# Patient Record
Sex: Female | Born: 1965 | Race: Black or African American | Hispanic: No | Marital: Married | Smoking: Current some day smoker
Health system: Southern US, Community
[De-identification: ages and names within clinical notes are randomized; demographics above are authoritative.]

## PROBLEM LIST (undated history)

## (undated) DIAGNOSIS — F4323 Adjustment disorder with mixed anxiety and depressed mood: Secondary | ICD-10-CM

## (undated) DIAGNOSIS — R002 Palpitations: Secondary | ICD-10-CM

## (undated) DIAGNOSIS — T7840XA Allergy, unspecified, initial encounter: Secondary | ICD-10-CM

## (undated) DIAGNOSIS — E119 Type 2 diabetes mellitus without complications: Secondary | ICD-10-CM

## (undated) DIAGNOSIS — R011 Cardiac murmur, unspecified: Secondary | ICD-10-CM

## (undated) DIAGNOSIS — Z8719 Personal history of other diseases of the digestive system: Secondary | ICD-10-CM

## (undated) DIAGNOSIS — M26609 Unspecified temporomandibular joint disorder, unspecified side: Secondary | ICD-10-CM

## (undated) DIAGNOSIS — N921 Excessive and frequent menstruation with irregular cycle: Secondary | ICD-10-CM

## (undated) DIAGNOSIS — K219 Gastro-esophageal reflux disease without esophagitis: Secondary | ICD-10-CM

## (undated) DIAGNOSIS — G709 Myoneural disorder, unspecified: Secondary | ICD-10-CM

## (undated) DIAGNOSIS — E669 Obesity, unspecified: Secondary | ICD-10-CM

## (undated) DIAGNOSIS — I10 Essential (primary) hypertension: Secondary | ICD-10-CM

## (undated) DIAGNOSIS — F419 Anxiety disorder, unspecified: Secondary | ICD-10-CM

## (undated) HISTORY — DX: Allergy, unspecified, initial encounter: T78.40XA

## (undated) HISTORY — DX: Gastro-esophageal reflux disease without esophagitis: K21.9

## (undated) HISTORY — DX: Obesity, unspecified: E66.9

## (undated) HISTORY — DX: Excessive and frequent menstruation with irregular cycle: N92.1

## (undated) HISTORY — DX: Adjustment disorder with mixed anxiety and depressed mood: F43.23

## (undated) HISTORY — PX: UPPER GASTROINTESTINAL ENDOSCOPY: SHX188

## (undated) HISTORY — DX: Personal history of other diseases of the digestive system: Z87.19

## (undated) HISTORY — DX: Type 2 diabetes mellitus without complications: E11.9

## (undated) HISTORY — DX: Essential (primary) hypertension: I10

## (undated) HISTORY — DX: Myoneural disorder, unspecified: G70.9

## (undated) HISTORY — DX: Unspecified temporomandibular joint disorder, unspecified side: M26.609

## (undated) HISTORY — DX: Palpitations: R00.2

## (undated) HISTORY — DX: Cardiac murmur, unspecified: R01.1

---

## 1987-01-16 HISTORY — PX: CHOLECYSTECTOMY: SHX55

## 2008-01-16 HISTORY — PX: WISDOM TOOTH EXTRACTION: SHX21

## 2009-05-15 LAB — CONVERTED CEMR LAB: Pap Smear: NORMAL

## 2009-12-14 ENCOUNTER — Ambulatory Visit: Payer: Self-pay | Admitting: Internal Medicine

## 2009-12-14 DIAGNOSIS — F17200 Nicotine dependence, unspecified, uncomplicated: Secondary | ICD-10-CM

## 2009-12-14 DIAGNOSIS — F4323 Adjustment disorder with mixed anxiety and depressed mood: Secondary | ICD-10-CM

## 2009-12-14 DIAGNOSIS — N921 Excessive and frequent menstruation with irregular cycle: Secondary | ICD-10-CM

## 2009-12-14 DIAGNOSIS — IMO0002 Reserved for concepts with insufficient information to code with codable children: Secondary | ICD-10-CM | POA: Insufficient documentation

## 2009-12-14 DIAGNOSIS — R12 Heartburn: Secondary | ICD-10-CM

## 2009-12-14 DIAGNOSIS — I10 Essential (primary) hypertension: Secondary | ICD-10-CM | POA: Insufficient documentation

## 2009-12-14 DIAGNOSIS — M542 Cervicalgia: Secondary | ICD-10-CM | POA: Insufficient documentation

## 2009-12-14 HISTORY — DX: Excessive and frequent menstruation with irregular cycle: N92.1

## 2009-12-14 HISTORY — DX: Adjustment disorder with mixed anxiety and depressed mood: F43.23

## 2009-12-23 ENCOUNTER — Encounter: Payer: Self-pay | Admitting: Internal Medicine

## 2009-12-23 ENCOUNTER — Telehealth: Payer: Self-pay | Admitting: Internal Medicine

## 2010-01-25 ENCOUNTER — Other Ambulatory Visit: Payer: Self-pay | Admitting: Internal Medicine

## 2010-01-25 ENCOUNTER — Ambulatory Visit
Admission: RE | Admit: 2010-01-25 | Discharge: 2010-01-25 | Payer: Self-pay | Source: Home / Self Care | Attending: Internal Medicine | Admitting: Internal Medicine

## 2010-01-25 LAB — LIPID PANEL
Cholesterol: 180 mg/dL (ref 0–200)
HDL: 35.1 mg/dL — ABNORMAL LOW (ref >39.00)
LDL Cholesterol: 123 mg/dL — ABNORMAL HIGH (ref 0–99)
Total CHOL/HDL Ratio: 5
Triglycerides: 111 mg/dL (ref 0.0–149.0)
VLDL: 22.2 mg/dL (ref 0.0–40.0)

## 2010-01-25 LAB — CBC WITH DIFFERENTIAL/PLATELET
Basophils Absolute: 0 10*3/uL (ref 0.0–0.1)
Basophils Relative: 0.5 % (ref 0.0–3.0)
Eosinophils Absolute: 0.2 10*3/uL (ref 0.0–0.7)
Eosinophils Relative: 2.4 % (ref 0.0–5.0)
HCT: 42.3 % (ref 36.0–46.0)
Hemoglobin: 14.7 g/dL (ref 12.0–15.0)
Lymphocytes Relative: 34.4 % (ref 12.0–46.0)
Lymphs Abs: 2.2 10*3/uL (ref 0.7–4.0)
MCHC: 34.8 g/dL (ref 30.0–36.0)
MCV: 90.9 fl (ref 78.0–100.0)
Monocytes Absolute: 0.3 10*3/uL (ref 0.1–1.0)
Monocytes Relative: 5.5 % (ref 3.0–12.0)
Neutro Abs: 3.7 10*3/uL (ref 1.4–7.7)
Neutrophils Relative %: 57.2 % (ref 43.0–77.0)
Platelets: 301 10*3/uL (ref 150.0–400.0)
RBC: 4.65 Mil/uL (ref 3.87–5.11)
RDW: 14.4 % (ref 11.5–14.6)
WBC: 6.4 10*3/uL (ref 4.5–10.5)

## 2010-01-25 LAB — BASIC METABOLIC PANEL
BUN: 18 mg/dL (ref 6–23)
CO2: 28 mEq/L (ref 19–32)
Calcium: 9.4 mg/dL (ref 8.4–10.5)
Chloride: 103 mEq/L (ref 96–112)
Creatinine, Ser: 0.7 mg/dL (ref 0.4–1.2)
GFR: 95.71 mL/min (ref >60.00)
Glucose, Bld: 91 mg/dL (ref 70–99)
Potassium: 4.3 mEq/L (ref 3.5–5.1)
Sodium: 139 mEq/L (ref 135–145)

## 2010-01-25 LAB — HEPATIC FUNCTION PANEL
ALT: 25 U/L (ref 0–35)
AST: 23 U/L (ref 0–37)
Albumin: 4.2 g/dL (ref 3.5–5.2)
Alkaline Phosphatase: 55 U/L (ref 39–117)
Bilirubin, Direct: 0.1 mg/dL (ref 0.0–0.3)
Total Bilirubin: 1 mg/dL (ref 0.3–1.2)
Total Protein: 7.5 g/dL (ref 6.0–8.3)

## 2010-01-25 LAB — TSH: TSH: 1.56 u[IU]/mL (ref 0.35–5.50)

## 2010-01-31 ENCOUNTER — Ambulatory Visit
Admission: RE | Admit: 2010-01-31 | Discharge: 2010-01-31 | Payer: Self-pay | Source: Home / Self Care | Attending: Internal Medicine | Admitting: Internal Medicine

## 2010-01-31 ENCOUNTER — Encounter: Payer: Self-pay | Admitting: Internal Medicine

## 2010-01-31 DIAGNOSIS — E669 Obesity, unspecified: Secondary | ICD-10-CM | POA: Insufficient documentation

## 2010-01-31 DIAGNOSIS — E786 Lipoprotein deficiency: Secondary | ICD-10-CM | POA: Insufficient documentation

## 2010-02-14 NOTE — Letter (Signed)
Summary: PATIENT HX FORMS  PATIENT HX FORMS   Imported By: Georgian Co 12/23/2009 11:30:01  _____________________________________________________________________  External Attachment:    Type:   Image     Comment:   External Document

## 2010-02-14 NOTE — Progress Notes (Signed)
Summary: wants diflucan  Phone Note Call from Patient Call back at [phone]   Caller: Patient----triage vm Summary of Call: has a yeast infection. she has these once a year.(listed in her chart). She is requesting diflucan.     [facility]   please advise. Initial call taken by: Warnell Forester,  December 23, 2009 8:38 AM  Follow-up for Phone Call        Pt is having vaginal itching and redness but no discharge.  Has not developed the discharge and has not been on an antibiotic recently.  Has used a different soap recently and this always causes.  [facility]  Follow-up by: Lynann Beaver CMA AAMA,  December 23, 2009 10:29 AM  Additional Follow-up for Phone Call Additional follow up Details #1::        Per Dr. Fabian Sharp- OTC work better as the pill like monistat and is perfer. If pt wants we can call in diflucan 150 #1 as a single dose no refills. Additional Follow-up by: Romualdo Bolk, CMA (AAMA),  December 23, 2009 12:50 PM    New/Updated Medications: DIFLUCAN 150 MG TABS (FLUCONAZOLE) one by mouth daily Prescriptions: DIFLUCAN 150 MG TABS (FLUCONAZOLE) one by mouth daily  #1 x 0   Entered by:   Lynann Beaver CMA AAMA   Authorized by:   Madelin Headings MD   Signed by:   Lynann Beaver CMA AAMA on 12/23/2009   Method used:   Electronically [facility]        [facility]  [phone]* (retail)       [address] [place], [place]  [address]       Ph: [phone] or [phone]       Fax: [phone]   RxID:   [id number]  Notified pt.

## 2010-02-14 NOTE — Assessment & Plan Note (Signed)
Summary: BRAND NEW PT/TO EST/CJR   Vital Signs:  Patient profile:   45 year old female Menstrual status:  irregular   mirena usage LMP:     11/16/2009 Height:      67 inches Weight:      258 pounds BMI:     40.55 O2 Sat:      96 % Temp:     98.7 degrees F oral Pulse rate:   85 / minute Pulse rhythm:   regular Resp:     12 per minute BP sitting:   124 / 82  Vitals Entered By: Lynann Beaver CMA AAMA (December 14, 2009 11:56 AM)  Nutrition Counseling: Patient's BMI is greater than 25 and therefore counseled on weight management options. CC: To establish and refill meds Is Patient Diabetic? No Pain Assessment Patient in pain? no      LMP (date): 11/16/2009     Menstrual Status irregular   mirena usage Enter LMP: 11/16/2009   History of Present Illness: Beth Wallace comes in today  for first time visit to establish . her previous primary care physician list [facility]  in [place] from where she moved. She is originally from [place] and is here closer to relatives. She has a diagnosis of hypertension reactive mood disorder occasional GRD sciatic nerve problem. Her last check was in May  where she reports that she had nl labs .  Has had Ocass  low  Vit d  levels and  taking  as needed.    GERD: only as needed  prilosec.  Ibuprofen    as needed  for   Menstrual cramps.    Considered    hysterectomy  > cause.  gyne records.  Anxiety and celexa :  as needed.  HT : controlled without se of meds   needs refill  she has had some problems with lower back and sciatic for which she was seen at an urgent care. She also has had some neck aches on the right but no radiating weakness or numbness in her hands or feet. She's working at home in front of the screen most hours of the day and hasn't time for for regular exercise. Irreg  bleeding   rx with IUD    May need a gyne consult   when established  Preventive Screening-Counseling & Management  Alcohol-Tobacco  Alcohol drinks/day: <1     Smoking Status: current     Smoke Cessation Stage: contemplative     Packs/Day: <0.25  Comments: off  4 year until daughter died.   Hep-HIV-STD-Contraception     Dental Visit-last 6 months yes  Safety-Violence-Falls     Seat Belt Use: yes     Firearms in the Home: firearms in the home     Firearm Counseling: not indicated; uses recommended firearm safety measures     Smoke Detectors: yes     Fall Risk: no  Past History:  Past Medical History: Hypertension on med from 1990s   during pregnancy  G3P1 Sciatica IUD GERD  PAP 4 /2011 Mammo 8 /2011 Tdap 5 /2011  Past Surgical History: Cholecystectomy 1989  Family History: duaghter murdered   in [place] 59   age 43 .   Father: HT  diabtes Mother:  Mom died 02-28-2009 heart  disease  HT  Siblings:   sister  murdered.    Social History: orig from [place] and changes   for job.  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         sciatic nerve back problem  seen in urgent care  and rec MRI.    but not done.     cariposa   and pain pill.   hydrocodone.  September.  hx of pinched nerve.   Physical Exam  General:  Well-developed,well-nourished,in no acute distress; alert,appropriate and cooperative throughout examination Head:  normocephalic and atraumatic.   Eyes:  PERRL, EOMs full, conjunctiva clear  Ears:  R ear normal and L ear normal.   Nose:  no  external deformity and no nasal discharge.   Mouth:  pharynx pink and moist.   Neck:  No deformities, masses, or tenderness noted. Lungs:  Normal respiratory effort, chest expands symmetrically. Lungs are clear to auscultation, no crackles or wheezes.no dullness.   Heart:  Normal rate and regular rhythm. S1 and S2 normal without gallop, murmur, click, rub or other extra sounds.no lifts.   Abdomen:  Bowel sounds positive,abdomen soft and non-tender without masses, organomegaly or   noted. Msk:  no joint swelling and no joint warmth.  some stiffness of neck muscles no midline tenderness Pulses:  pulses intact without delay   Extremities:  no clubbing cyanosis or edema  Neurologic:  alert & oriented X3, strength normal in all extremities, and gait normal.  neg slr  Pt is A&Ox3,affect,speech,memory,attention,&motor skills appear intact.  Skin:  turgor normal, color normal, no ecchymoses, and no petechiae.   Cervical Nodes:  No lymphadenopathy noted Psych:  Oriented X3, memory intact for recent and remote, normally interactive, good eye contact, not anxious appearing, and not depressed appearing.     Impression & Recommendations:  Problem # 1:  HYPERTENSION (ICD-401.9)  Her updated medication list for this problem includes:    Lisinopril-hydrochlorothiazide 20-12.5 Mg Tabs (Lisinopril-hydrochlorothiazide) ..... One by mouth daily  BP today: 124/82  Problem # 2:  BACK PAIN WITH RADICULOPATHY (ICD-729.2) improved over time according to patient at this point cannot think MRIs indicated.Marland Kitchen expectant management  Problem # 3:  ADJ DISORDER WITH MIXED ANXIETY & DEPRESSED MOOD (ICD-309.28) actually doing well and improve from initial medication use she's using Ativan and selects out as needed. This is mostly related to legal proceedings regarding her daughter's murder.  Problem # 4:  NECK AND BACK PAIN (ICD-723.1) seems to be muscular and possibly aggravated by lack of activity and  workstation Her updated medication list for this problem includes:    Flexeril 10 Mg Tabs (Cyclobenzaprine hcl) .Marland Kitchen... Prn    Ibuprofen 800 Mg Tabs (Ibuprofen) .Marland Kitchen... Prn  Problem # 5:  HEARTBURN (ICD-787.1) possible GERD. Using Prilosec is needed  Problem # 6:  TOBACCO USE (ICD-305.1) had stopped and restarted when her daughter was murdered is smoking minimally. Encouraged to stop and she is motivated.  Problem # 7:  METRORRHAGIA (ICD-626.6) IUD for therapy  may need gyne follow up .   Complete Medication List: 1)  Lisinopril-hydrochlorothiazide 20-12.5 Mg Tabs (Lisinopril-hydrochlorothiazide) .... One by mouth daily 2)  Ativan 0.5 Mg Tabs (  Lorazepam) .... ? 3)  Flexeril 10 Mg Tabs (Cyclobenzaprine hcl) .... Prn 4)  Celexa 20 Mg Tabs (Citalopram hydrobromide) .... ? 5)  Ibuprofen 800 Mg Tabs (Ibuprofen) .... Prn 6)  Prilosec 20 Mg Cpdr (Omeprazole) .... Prn 7)  Mirena 20 Mcg/24hr Iud (Levonorgestrel) .... ? type  Patient Instructions: 1)  check you work station and increase exercise for now as this will help your neck and back problem. 2)  Sign release for last year of lab and medical care and your GYNE records. 3)  schedule  cpx and labs   and check up  in January  2012 . ( can make a slot)  Prescriptions: LISINOPRIL-HYDROCHLOROTHIAZIDE 20-12.5 MG TABS (LISINOPRIL-HYDROCHLOROTHIAZIDE) one by mouth daily  #90 x 3   Entered and Authorized by:   Madelin Headings MD   Signed by:   Madelin Headings MD on 12/14/2009   Method used:   [facility]        [facility]  [phone]* (retail)       [address] [place], [place]  [address]       Ph: [phone] or [phone]       Fax: [phone]   RxID:   [id number]    Orders Added: 1)  New Patient Level IV [99204]      Prevention & Chronic Care Immunizations   Influenza vaccine: Not documented    Tetanus booster: Not documented    Pneumococcal vaccine: Not documented  Other Screening   Pap smear: Not  documented    Mammogram: Not documented   Smoking status: current  (12/14/2009)  Lipids   Total Cholesterol: Not documented   LDL: Not documented   LDL Direct: Not documented   HDL: Not documented   Triglycerides: Not documented  Hypertension   Last Blood Pressure: 124 / 82  (12/14/2009)   Serum creatinine: Not documented   Serum potassium Not documented  Self-Management Support :    Hypertension self-management support: Not documented

## 2010-02-16 NOTE — Assessment & Plan Note (Signed)
Summary: cpx//acm rsc per pt ok per doc/njr   Vital Signs:  Patient profile:   45 year old female Menstrual status:  irregular   mirena usage Height:      67 inches Weight:      256 pounds Pulse rate:   72 / minute BP sitting:   120 / 80  (left arm) Cuff size:   large  Vitals Entered By: Romualdo Bolk, CMA (AAMA) (January 31, 2010 1:09 PM) CC: CPX no pap- Pt has a gyn who does paps   History of Present Illness: Beth Wallace comes in today  for preventive visit . Since last visit has done well. HT: no change meds doing well. Mood : only taking  ativan  as needed  as  needed for anxiety attacks since death of daughter coping well. Tobacco  only 2-3 per day Neck pain an right middle finger nubmness seem related to laying on elevated pillows and work station .still on computer 10 hours per day and work station not yet optimal no weakness or other neuro signs .   Preventive Care Screening  Last Tetanus Booster:    Date:  05/15/2009    Results:  Historical   Pap Smear:    Date:  05/15/2009    Results:  normal    Preventive Screening-Counseling & Management  Alcohol-Tobacco     Alcohol drinks/day: <1     Smoking Status: current     Smoke Cessation Stage: contemplative     Packs/Day: <0.25     Tobacco Counseling: to quit use of tobacco products  Caffeine-Diet-Exercise     Caffeine use/day: no     Does Patient Exercise: yes     Type of exercise: walk     Exercise (avg: min/session): 30-60     Times/week: 3  Hep-HIV-STD-Contraception     Dental Visit-last 6 months yes  Safety-Violence-Falls     Seat Belt Use: yes     Firearms in the Home: firearms in the home     Firearm Counseling: not indicated; uses recommended firearm safety measures     Smoke Detectors: yes     Fall Risk: no  Current Medications (verified): 1)  Lisinopril-Hydrochlorothiazide 20-12.5 Mg Tabs (Lisinopril-Hydrochlorothiazide) .... One By Mouth Daily 2)  Ativan 0.5 Mg Tabs (Lorazepam)  .... ? 3)  Flexeril 10 Mg Tabs (Cyclobenzaprine Hcl) .... Prn 4)  Celexa 20 Mg Tabs (Citalopram Hydrobromide) .... ? 5)  Ibuprofen 800 Mg Tabs (Ibuprofen) .Marland Kitchen.. 1 Once Daily or As Directed 6)  Prilosec 20 Mg Cpdr (Omeprazole) .Marland Kitchen.. 1 By Mouth Once Daily 7)  Mirena 20 Mcg/24hr Iud (Levonorgestrel) .... ? Type  Allergies (verified): No Known Drug Allergies  Past History:  Past medical, surgical, family and social histories (including risk factors) reviewed, and no changes noted (except as noted below).  Past Medical History: Reviewed history from 12/14/2009 and no changes required. Hypertension on med from 1990s   during pregnancy  G3P1 Sciatica IUD GERD  PAP 4 /2011 Mammo 8 /2011 Tdap 5 /2011  Past Surgical History: Reviewed history from 12/14/2009 and no changes required. Cholecystectomy 1989  Past History:  Care Management: Gynecology: [place]  Family History: Reviewed history from 12/14/2009 and no changes required. duaghter murdered   in [place] 11   age 43 .   Father: HT  diabtes Mother:  Mom died 03-04-09 heart  disease  HT  Siblings:   sister  murdered.    Social History: Reviewed history from 12/14/2009 and no  changes required. orig from [place] and changes   for job.  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   Eyes:  PERRL, EOMs full, conjunctiva clear  Ears:  R ear normal and L ear normal.  no external deformities.   Nose:  no external deformity and no nasal discharge.   Mouth:  pharynx pink and moist.   good dentition.   Neck:  No deformities, masses, or tenderness noted. Chest Wall:  No deformities, masses, or tenderness noted. Breasts:  No mass, nodules, thickening, tenderness, bulging, retraction, inflamation, nipple discharge or skin changes noted.   Lungs:  Normal respiratory effort, chest expands symmetrically. Lungs are clear to auscultation, no crackles or wheezes. Heart:  Normal rate and regular rhythm. S1 and S2 normal without gallop, murmur, click, rub or other extra sounds. Abdomen:  Bowel sounds positive,abdomen soft and non-tender without masses, organomegaly or   noted. ruq scar fom GB surgery Genitalia:  per gyne Msk:  normal ROM, no joint swelling, no joint warmth, and no redness over joints.   no hand atrophy  grip nl   Pulses:  pulses intact without delay   Extremities:  no clubbing cyanosis or edema  Neurologic:  alert & oriented X3, strength normal in all extremities, gait normal, and DTRs symmetrical and normal.   Skin:  turgor normal, color normal, no ecchymoses, no petechiae, and no purpura.   Cervical Nodes:  No lymphadenopathy noted Axillary Nodes:  No palpable lymphadenopathy Inguinal Nodes:  No significant adenopathy Psych:  Oriented X3, memory intact for recent and remote, normally interactive, good eye contact, not anxious appearing, and not depressed appearing.   ekg nsr   Impression & Recommendations:  Problem # 1:  PREVENTIVE HEALTH CARE (ICD-V70.0)  Discussed nutrition,exercise,diet,healthy weight, vitamin D and calcium.   Orders: EKG w/ Interpretation (93000)  Problem # 2:  HYPERTENSION (ICD-401.9)  Her updated medication list for this problem includes:    Lisinopril-hydrochlorothiazide 20-12.5 Mg Tabs (Lisinopril-hydrochlorothiazide) ..... One by mouth daily  BP today: 120/80 Prior BP: 124/82 (12/14/2009)  Labs Reviewed: K+: 4.3 (01/25/2010) Creat: : 0.7 (01/25/2010)   Chol: 180 (01/25/2010)   HDL: 35.10 (01/25/2010)   LDL: 123 (01/25/2010)    TG: 111.0 (01/25/2010)  Orders: EKG w/ Interpretation (93000)  Problem # 3:  TOBACCO USE (ICD-305.1) disc dc counsel  Problem # 4:  HEARTBURN (ICD-787.1) Assessment: Improved using meds rn   Problem # 5:  NECK AND BACK PAIN (ICD-723.1) back better  had neck postural when lays certain way with right middle finger numbness  currently not active ... seems  radiular no alarm reatures Her updated medication list for this problem includes:    Flexeril 10 Mg Tabs (Cyclobenzaprine hcl) .Marland Kitchen... Prn    Ibuprofen 800 Mg Tabs (Ibuprofen) .Marland Kitchen... 1 once daily or as directed  Problem # 6:  ADJ DISORDER WITH MIXED ANXIETY & DEPRESSED MOOD (ICD-309.28) only usin ativan as needed   Problem # 7:  OBESITY (ICD-278.00) counseled  Ht: 67 (01/31/2010)   Wt: 256 (01/31/2010)   BMI: 40.55 (12/14/2009)  Problem # 8:  dysmenorrhea to see dr Henderson Cloud   iud for 3 years   Problem # 9:  LOW HDL (ICD-272.5)  lifestyle intervention   Labs Reviewed: SGOT: 23 (01/25/2010)   SGPT: 25 (01/25/2010)   HDL:35.10 (01/25/2010)  LDL:123 (01/25/2010)  Chol:180 (01/25/2010)  Trig:111.0 (01/25/2010)  Complete Medication List: 1)  Lisinopril-hydrochlorothiazide 20-12.5 Mg Tabs (Lisinopril-hydrochlorothiazide) .... One by mouth daily 2)  Ativan 0.5 Mg Tabs (Lorazepam) .... ? 3)  Flexeril 10 Mg Tabs (Cyclobenzaprine hcl) .... Prn 4)  Celexa 20 Mg Tabs (Citalopram hydrobromide) .... ? 5)  Ibuprofen 800 Mg Tabs (Ibuprofen) .Marland Kitchen.. 1 once daily or as directed 6)  Prilosec 20 Mg Cpdr (Omeprazole) .Marland Kitchen.. 1 by mouth once daily 7)  Mirena 20 Mcg/24hr Iud (Levonorgestrel) .... ? type  Patient Instructions: 1)  see Dr Henderson Cloud for your cramps with the IUD. 2)  Call if  neck and finger numbness  is persistent or  progressive   and can do a referral.  3)  Continue Bp meds. 4)  D/c tobacco as you are doing  weight loss and exercise to help your  lipids and health.  5)  Check up in 12 months  6)  ROV  in 6 months if having any  problems Prescriptions: PRILOSEC 20 MG CPDR (OMEPRAZOLE) 1 by mouth once daily  #30 x 6   Entered and Authorized by:   Madelin Headings MD   Signed by:   Madelin Headings MD on 01/31/2010   Method used:   [facility]        [facility]  [phone][facility] (retail)       [address] [place], [place]  [address]       Ph: [phone] or [phone]       Fax: [phone]   RxID:   [id number]    Orders Added: 1)  Est. Patient 40-64 years [99396] 2)  EKG w/ Interpretation [93000] 3)  Est. Patient Level II [36644]

## 2010-05-01 ENCOUNTER — Telehealth: Payer: Self-pay | Admitting: *Deleted

## 2010-05-01 NOTE — Telephone Encounter (Signed)
OK to refill

## 2010-05-01 NOTE — Telephone Encounter (Signed)
Nasal congestion and pollen allergy and always uses Flonase when she was in [place].  Also, has laryngitis.

## 2010-05-02 ENCOUNTER — Ambulatory Visit (INDEPENDENT_AMBULATORY_CARE_PROVIDER_SITE_OTHER): Payer: 59 | Admitting: Internal Medicine

## 2010-05-02 ENCOUNTER — Encounter: Payer: Self-pay | Admitting: Internal Medicine

## 2010-05-02 VITALS — BP 120/80 | HR 78 | Temp 98.5°F | Wt 256.0 lb

## 2010-05-02 DIAGNOSIS — J019 Acute sinusitis, unspecified: Secondary | ICD-10-CM

## 2010-05-02 DIAGNOSIS — R1110 Vomiting, unspecified: Secondary | ICD-10-CM

## 2010-05-02 DIAGNOSIS — F17200 Nicotine dependence, unspecified, uncomplicated: Secondary | ICD-10-CM

## 2010-05-02 DIAGNOSIS — J040 Acute laryngitis: Secondary | ICD-10-CM

## 2010-05-02 MED ORDER — AZITHROMYCIN 250 MG PO TABS: 250.0000 mg | ORAL_TABLET | ORAL | Status: AC

## 2010-05-02 MED ORDER — FLUTICASONE PROPIONATE 50 MCG/ACT NA SUSP: 1.0000 | Freq: Every day | NASAL | Status: DC

## 2010-05-02 MED ORDER — HYDROCODONE-HOMATROPINE 5-1.5 MG/5ML PO SYRP: 5.0000 mL | ORAL_SOLUTION | ORAL | Status: AC | PRN

## 2010-05-02 NOTE — Patient Instructions (Signed)
Take antibiotic for presumed bacterial sinus infection. Continued tobacco free. Can use the cough medicine at night for comfort. Either way expect improvement within 3-5 days. Call if getting recurrent fever or shortness of breath.  Continue the Flonase because when necessary allergy season.

## 2010-05-02 NOTE — Progress Notes (Signed)
  Subjective:    Patient ID: Beth Wallace, female    DOB: 1965-01-18, 45 y.o.   MRN: [id number]  HPI Patient comes in with 5+ days of above problem. Onset with face pain congestion and pressure. She now has significant hoarseness sore throat and coughing. She has post cough emesis. She's tried many over-the-counter as including sinus irrigation and is getting a refill on her Flonase.    She has not had tobacco since this began as a trying to stop down to a third of a pack per day. Her throat in her chest feel tight at times but no hemoptysis or shortness of breath.   Review of Systems She's no longer on Celexa and just take lorazepam as needed. Hypertension is in control. No fever or chills rest as per history of present illness    Objective:   Physical Exam Well-developed well-nourished in no acute distress but is very hoarse and congested. HEENT: Normocephalic ;atraumatic , Eyes;  PERRL, EOMs  Full, lids and conjunctiva clear,,Ears: no deformities, canals nl, TM landmarks normal, Nose: no deformity or discharge very congested tenderness in the right maxillary area and left frontal.  Mouth : OP   mildly red copious PND no lesion or edema. Chest:  Clear to A&P without wheezes rales or rhonchi CV:  S1-S2 no gallops or murmurs peripheral perfusion is normal No clubbing cyanosis or edema  .        Assessment & Plan:  Acute respiratory inspection probable sinusitis although within the first 7 days the severity is significant and she also has post cough emesis. Possible allergic history agree with restarting the Flonase. We'll treat with antibiotics and cough medicine expectant management.  Tobacco   encouraged to stay tobacco free Hypertension controlled

## 2010-05-02 NOTE — Telephone Encounter (Signed)
Per Dr. Fabian Sharp- ok to refill. Rx sent to pharmacy.

## 2010-06-13 ENCOUNTER — Telehealth: Payer: Self-pay | Admitting: *Deleted

## 2010-06-13 MED ORDER — OMEPRAZOLE 20 MG PO CPDR: 20.0000 mg | DELAYED_RELEASE_CAPSULE | Freq: Every day | ORAL | Status: DC

## 2010-06-13 NOTE — Telephone Encounter (Signed)
rx sent to pharmacy

## 2010-06-20 ENCOUNTER — Telehealth: Payer: Self-pay | Admitting: *Deleted

## 2010-06-20 MED ORDER — OMEPRAZOLE 20 MG PO CPDR: 20.0000 mg | DELAYED_RELEASE_CAPSULE | Freq: Every day | ORAL | Status: DC

## 2010-06-20 NOTE — Telephone Encounter (Signed)
rx sent to pharmacy

## 2010-07-10 ENCOUNTER — Emergency Department (HOSPITAL_COMMUNITY)
Admission: EM | Admit: 2010-07-10 | Discharge: 2010-07-10 | Disposition: A | Discharge status: Home / Self Care | Payer: 59 | Attending: Emergency Medicine | Admitting: Emergency Medicine

## 2010-07-10 ENCOUNTER — Telehealth: Payer: Self-pay | Admitting: *Deleted

## 2010-07-10 ENCOUNTER — Emergency Department (HOSPITAL_COMMUNITY): Payer: 59

## 2010-07-10 DIAGNOSIS — N949 Unspecified condition associated with female genital organs and menstrual cycle: Secondary | ICD-10-CM | POA: Insufficient documentation

## 2010-07-10 DIAGNOSIS — D259 Leiomyoma of uterus, unspecified: Secondary | ICD-10-CM | POA: Insufficient documentation

## 2010-07-10 DIAGNOSIS — I10 Essential (primary) hypertension: Secondary | ICD-10-CM | POA: Insufficient documentation

## 2010-07-10 LAB — URINALYSIS, ROUTINE W REFLEX MICROSCOPIC
Bilirubin Urine: NEGATIVE
Glucose, UA: NEGATIVE mg/dL
Protein, ur: NEGATIVE mg/dL

## 2010-07-10 LAB — URINE MICROSCOPIC-ADD ON

## 2010-07-10 LAB — WET PREP, GENITAL
Trich, Wet Prep: NONE SEEN
Yeast Wet Prep HPF POC: NONE SEEN

## 2010-07-10 LAB — PREGNANCY, URINE: Preg Test, Ur: NEGATIVE

## 2010-07-10 NOTE — Telephone Encounter (Signed)
Spoke to [place] and pt advised to go to GYN, and if cannot get in, go to ER.

## 2010-07-10 NOTE — Telephone Encounter (Signed)
Mirena is cramping and cannot find string.

## 2010-07-12 LAB — RPR: RPR Ser Ql: NONREACTIVE

## 2010-08-16 ENCOUNTER — Telehealth: Payer: Self-pay | Admitting: *Deleted

## 2010-08-16 NOTE — Telephone Encounter (Signed)
Pt is going to have a hyst on 9/26 and was told that she needs a medical clearance. Pt had a cpx done on 01/31/10. Do you still need her to come in for or , or can you just do a clearance letter?

## 2010-08-16 NOTE — Telephone Encounter (Signed)
Per dr. Fabian Sharp- appt.

## 2010-08-17 NOTE — Telephone Encounter (Signed)
Pt aware and appt made.

## 2010-09-16 HISTORY — PX: ABDOMINAL HYSTERECTOMY: SHX81

## 2010-09-20 ENCOUNTER — Ambulatory Visit (INDEPENDENT_AMBULATORY_CARE_PROVIDER_SITE_OTHER): Payer: 59 | Admitting: Internal Medicine

## 2010-09-20 ENCOUNTER — Encounter: Payer: Self-pay | Admitting: Internal Medicine

## 2010-09-20 VITALS — BP 140/82 | HR 72 | Ht 67.5 in | Wt 266.0 lb

## 2010-09-20 DIAGNOSIS — N921 Excessive and frequent menstruation with irregular cycle: Secondary | ICD-10-CM

## 2010-09-20 DIAGNOSIS — I10 Essential (primary) hypertension: Secondary | ICD-10-CM

## 2010-09-20 DIAGNOSIS — H9209 Otalgia, unspecified ear: Secondary | ICD-10-CM | POA: Insufficient documentation

## 2010-09-20 DIAGNOSIS — Z01818 Encounter for other preprocedural examination: Secondary | ICD-10-CM

## 2010-09-20 MED ORDER — IBUPROFEN 800 MG PO TABS: 800.0000 mg | ORAL_TABLET | Freq: Three times a day (TID) | ORAL | Status: DC | PRN

## 2010-09-20 NOTE — Patient Instructions (Signed)
Will send info to  Dr Henderson Cloud . NO contraindication to surgery. Take ibuprofen with caution . Can interfere with blood pressure medication. Check as needed if we can help.

## 2010-09-20 NOTE — Assessment & Plan Note (Signed)
Controlled with iud .Marland Kitchen To have hysterectomy

## 2010-09-20 NOTE — Assessment & Plan Note (Signed)
Up today poss from fatigue and external factors cautioned about nsaids

## 2010-09-20 NOTE — Progress Notes (Signed)
  Subjective:    Patient ID: Beth Wallace, female    DOB: [birth year], 45 y.o.   MRN: [id number]  HPI  Patient comes in today for medical clearance for robotic partial vs  total if needed planned partial hysterectomy for September 26th per Dr Henderson Cloud . She is not  looking forward to the surgery but no major changes in her health status since her last visit.  She is taking her blood pressure medicine regularly takes ibuprofen as needed for cramps. 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 Has not been wearing her mouthpieces recently. Thinks her blood pressures up to date because of fatigue.    Review of Systems No chest pain shortness of breath fever sleep apnea acute injury or bleeding. Rest of ros no change .   Past Medical History  Diagnosis Date  . Hypertension in pregnancy     on meds 1990  . Sciatica   . GERD (gastroesophageal reflux disease)    Past Surgical History  Procedure Date  . Cholecystectomy     reports that she has been smoking.  She does not have any smokeless tobacco history on file. She reports that she does not drink alcohol or use illicit drugs. family history includes Diabetes in her father; Heart disease in her mother; and Hypertension in her father. Allergies  Allergen Reactions  . Contrast Media (Iodinated Diagnostic Agents)     CT dyeivp       Objective:   Physical Exam Physical Exam: Vital signs reviewed FAO:ZHYQ is a well-developed well-nourished alert cooperative  aamale who appears her stated age in no acute distress.  HEENT: normocephalic  traumatic , Eyes: PERRL EOM's full, conjunctiva clear, Nares: paten,t no deformity discharge or tenderness., Ears: no deformity EAC's clear TMs with normal landmarks. Mouth: clear OP, no lesions, edema.  Moist mucous membranes. Dentition in adequate repair. NECK: supple without masses, thyromegaly or bruits. CHEST/PULM:  Clear to auscultation and percussion breath sounds equal no wheeze , rales or  rhonchi. No chest wall deformities or tenderness. CV: PMI is nondisplaced, S1 S2 no gallops, murmurs, rubs. Peripheral pulses are full without delay.No JVD .  Repeat BP readings large Right 140/80 left 138/82 ABDOMEN: Bowel sounds normal nontender  No guard or rebound, no hepato splenomegal no CVA tenderness.  . Extremtities:  No clubbing cyanosis or edema, no acute joint swelling or redness no focal atrophy NEURO:  Oriented x3, cranial nerves 3-12 appear to be intact, no obvious focal weakness,gait within normal limits SKIN: No acute rashes normal turgor, color, no bruising or petechiae. PSYCH: Oriented, good eye contact, no obvious depression anxiety, cognition and judgment appear normal.      Assessment & Plan:  Preoperative  evaluation for hysterectomy   Hypertension    Up today poss from baseline   caution with nsaid.  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

## 2010-10-03 NOTE — Patient Instructions (Addendum)
   Your procedure is scheduled on: Wednesday  Enter through the Main Entrance of [facility] at: 6am Pick up the phone at the desk and dial [phone]  Please call this number if you have any problems the morning of surgery: [phone]  Remember: Do not eat food after midnight  Do not drink clear liquids after:midnight Take these medicines the morning of surgery with a SIP OF WATER:prilosec and lisinopril  Do not wear jewelry, make-up, or FINGER nail polish Do not wear lotions, powders, or perfumes. Do not shave 48 hours prior to surgery. Do not bring valuables to the hospital. Leave suitcase in the car. After Surgery it may be brought to your room. For patients being admitted to the hospital, checkout time is 11:00am the day of discharge.  Patients discharged on the day of surgery will not be allowed to drive home.   Name and phone number of your driver: boyfriend Trey Paula [phone]   Remember to use your hibiclens as instructed.

## 2010-10-04 ENCOUNTER — Encounter (HOSPITAL_COMMUNITY)
Admission: RE | Admit: 2010-10-04 | Discharge: 2010-10-04 | Disposition: A | Discharge status: Home / Self Care | Payer: 59 | Source: Ambulatory Visit | Attending: Obstetrics and Gynecology | Admitting: Obstetrics and Gynecology

## 2010-10-04 ENCOUNTER — Encounter (HOSPITAL_COMMUNITY): Payer: Self-pay

## 2010-10-04 ENCOUNTER — Other Ambulatory Visit: Payer: Self-pay

## 2010-10-04 HISTORY — DX: Anxiety disorder, unspecified: F41.9

## 2010-10-04 LAB — SURGICAL PCR SCREEN
MRSA, PCR: NEGATIVE
Staphylococcus aureus: NEGATIVE

## 2010-10-04 LAB — BASIC METABOLIC PANEL
Calcium: 9.7 mg/dL (ref 8.4–10.5)
Chloride: 99 mEq/L (ref 96–112)
Creatinine, Ser: 0.73 mg/dL (ref 0.50–1.10)
GFR calc Af Amer: >60 mL/min (ref >60)
Sodium: 136 mEq/L (ref 135–145)

## 2010-10-04 LAB — CBC
Hemoglobin: 14.2 g/dL (ref 12.0–15.0)
MCHC: 32.8 g/dL (ref 30.0–36.0)
RDW: 14 % (ref 11.5–15.5)
WBC: 6.6 10*3/uL (ref 4.0–10.5)

## 2010-10-04 NOTE — Anesthesia Preprocedure Evaluation (Signed)
Anesthesia Evaluation  Name, MR# and DOB Patient awake  General Assessment Comment  Reviewed: Allergy & Precautions, H&P , Patient's Chart, lab work & pertinent test results, reviewed documented beta blocker date and time   History of Anesthesia Complications Negative for: history of anesthetic complications  Airway Mallampati: IV TM Distance: >3 FB Neck ROM: full    Dental No notable dental hx.    Pulmonary  clear to auscultation  pulmonary exam normalPulmonary Exam Normal breath sounds clear to auscultation none    Cardiovascular Exercise Tolerance: Good hypertension, regular Normal    Neuro/Psych Negative Neurological ROS  Negative Psych ROS  GI/Hepatic/Renal negative GI ROS  negative Liver ROS  negative Renal ROS   GERD Controlled     Endo/Other  Negative Endocrine ROS (+)   Morbid obesity  Abdominal   Musculoskeletal   Hematology negative hematology ROS (+)   Peds  Reproductive/Obstetrics negative OB ROS    Anesthesia Other Findings EKG with nonspecific changes... No symptoms and patient with normal exercise tolerance.  Discussed specific risks for robotic procedure            Anesthesia Physical Anesthesia Plan  ASA: III  Anesthesia Plan: General   Post-op Pain Management:    Induction:   Airway Management Planned:   Additional Equipment:   Intra-op Plan:   Post-operative Plan:   Informed Consent: I have reviewed the patients History and Physical, chart, labs and discussed the procedure including the risks, benefits and alternatives for the proposed anesthesia with the patient or authorized representative who has indicated his/her understanding and acceptance.   Dental Advisory Given  Plan Discussed with: CRNA and Surgeon  Anesthesia Plan Comments:         Anesthesia Quick Evaluation

## 2010-10-09 ENCOUNTER — Other Ambulatory Visit: Payer: Self-pay | Admitting: Obstetrics and Gynecology

## 2010-10-09 NOTE — H&P (Signed)
45 y.o. yo complains of symptomatic fibroid uterus.  Although she has good period control with an intact IUD, she continues to have bloating, cramping and pain.  She desires definitive.  Past Medical History  Diagnosis Date  . Hypertension in pregnancy     on meds 1990  . GERD (gastroesophageal reflux disease)   . Anxiety    Past Surgical History  Procedure Date  . Cholecystectomy 1989  . Cesarean section   . Wisdom tooth extraction 2010    History   Social History  . Marital Status: Single    Spouse Name: N/A    Number of Children: N/A  . Years of Education: N/A   Occupational History  . Not on file.   Social History Main Topics  . Smoking status: Current Everyday Smoker -- 0.3 packs/day  . Smokeless tobacco: Not on file  . Alcohol Use: Yes  . Drug Use: No  . Sexually Active: Not on file   Other Topics Concern  . Not on file   Social History Narrative   Orig from [place] and changes for job7-8 hours sleep per nighBorn in [place], [place] DegreeIs a project manageHH of 2BF and [id number] parent daughter murdered 2008    No current facility-administered medications on file prior to encounter.   Current Outpatient Prescriptions on File Prior to Encounter  Medication Sig Dispense Refill  . cyclobenzaprine (FLEXERIL) 10 MG tablet Take 5 mg by mouth daily as needed. Muscle spasms      . levonorgestrel (MIRENA) 20 MCG/24HR IUD 1 each by Intrauterine route once.        Marland Kitchen lisinopril-hydrochlorothiazide (PRINZIDE,ZESTORETIC) 20-12.5 MG per tablet Take 1 tablet by mouth daily.        Marland Kitchen LORazepam (ATIVAN) 0.5 MG tablet Take 0.5 mg by mouth every 8 (eight) hours.          Allergies  Allergen Reactions  . Contrast Media (Iodinated Diagnostic Agents) Anaphylaxis    CT dye ivp Throat swelling    @VITALS2 @  Lungs: clear to ascultation Cor:  RRR Abdomen:  soft, nontender, nondistended. Ex:  no cords, erythema Pelvic:  12 week size uterus, slightly bulky.   NEFG  U/S  11.2 x 6.1x4.6.  Large 5x4.6x4.2 fundal fibroid.  OV nml and no ff.  U/S same from 10-10  A:  Symptomatic fibroid uterus, desires definitive.   P:   For Robotic TLH.  BSO only if frank disease.   All risks, benefits and alternatives d/w patient and she desires to proceed .  Patient has undergone a modified bowel prep and will receive preop antibiotics and SCDs during the operation.     Dimetrius Montfort A

## 2010-10-10 MED ORDER — CEFOTETAN DISODIUM 2 G IJ SOLR
2.0000 g | INTRAMUSCULAR | Status: AC
  Administered 2010-10-11: 2 g via INTRAVENOUS
  Filled 2010-10-10: qty 2

## 2010-10-11 ENCOUNTER — Ambulatory Visit (HOSPITAL_COMMUNITY)
Admission: RE | Admit: 2010-10-11 | Discharge: 2010-10-12 | Disposition: A | Discharge status: Home / Self Care | Payer: 59 | Source: Ambulatory Visit | Attending: Obstetrics and Gynecology | Admitting: Obstetrics and Gynecology

## 2010-10-11 ENCOUNTER — Encounter (HOSPITAL_COMMUNITY): Payer: Self-pay | Admitting: *Deleted

## 2010-10-11 ENCOUNTER — Encounter (HOSPITAL_COMMUNITY): Payer: Self-pay | Admitting: Anesthesiology

## 2010-10-11 ENCOUNTER — Encounter (HOSPITAL_COMMUNITY)
Admission: RE | Disposition: A | Discharge status: Home / Self Care | Payer: Self-pay | Source: Ambulatory Visit | Attending: Obstetrics and Gynecology

## 2010-10-11 ENCOUNTER — Other Ambulatory Visit: Payer: Self-pay | Admitting: Obstetrics and Gynecology

## 2010-10-11 ENCOUNTER — Ambulatory Visit (HOSPITAL_COMMUNITY): Payer: 59 | Admitting: Anesthesiology

## 2010-10-11 DIAGNOSIS — D251 Intramural leiomyoma of uterus: Secondary | ICD-10-CM | POA: Insufficient documentation

## 2010-10-11 DIAGNOSIS — Z01812 Encounter for preprocedural laboratory examination: Secondary | ICD-10-CM | POA: Insufficient documentation

## 2010-10-11 DIAGNOSIS — Z01818 Encounter for other preprocedural examination: Secondary | ICD-10-CM | POA: Insufficient documentation

## 2010-10-11 DIAGNOSIS — Z90710 Acquired absence of both cervix and uterus: Secondary | ICD-10-CM

## 2010-10-11 LAB — PREGNANCY, URINE: Preg Test, Ur: NEGATIVE

## 2010-10-11 SURGERY — ROBOTIC ASSISTED TOTAL HYSTERECTOMY
Anesthesia: General | Site: Bladder | Wound class: Clean Contaminated

## 2010-10-11 MED ORDER — FENTANYL CITRATE 0.05 MG/ML IJ SOLN: 25.0000 ug | INTRAMUSCULAR | Status: DC | PRN

## 2010-10-11 MED ORDER — GLYCOPYRROLATE 0.2 MG/ML IJ SOLN
INTRAMUSCULAR | Status: DC | PRN
  Administered 2010-10-11: .2 mg via INTRAVENOUS

## 2010-10-11 MED ORDER — ZOLPIDEM TARTRATE 5 MG PO TABS: 5.0000 mg | ORAL_TABLET | Freq: Every evening | ORAL | Status: DC | PRN

## 2010-10-11 MED ORDER — OXYCODONE-ACETAMINOPHEN 5-325 MG PO TABS
1.0000 | ORAL_TABLET | ORAL | Status: DC | PRN
  Administered 2010-10-11 (×3): 2 via ORAL
  Administered 2010-10-12: 1 via ORAL
  Filled 2010-10-11: qty 1
  Filled 2010-10-11 (×3): qty 2

## 2010-10-11 MED ORDER — SCOPOLAMINE 1 MG/3DAYS TD PT72
1.0000 | MEDICATED_PATCH | Freq: Once | TRANSDERMAL | Status: DC
  Administered 2010-10-11: 1.5 mg via TRANSDERMAL

## 2010-10-11 MED ORDER — MEPERIDINE HCL 25 MG/ML IJ SOLN: 6.2500 mg | INTRAMUSCULAR | Status: DC | PRN

## 2010-10-11 MED ORDER — MIDAZOLAM HCL 5 MG/5ML IJ SOLN
INTRAMUSCULAR | Status: DC | PRN
  Administered 2010-10-11: 2 mg via INTRAVENOUS

## 2010-10-11 MED ORDER — KETOROLAC TROMETHAMINE 30 MG/ML IJ SOLN
INTRAMUSCULAR | Status: DC | PRN
  Administered 2010-10-11: 60 mg via INTRAVENOUS

## 2010-10-11 MED ORDER — HYDROCHLOROTHIAZIDE 12.5 MG PO CAPS
12.5000 mg | ORAL_CAPSULE | Freq: Every day | ORAL | Status: DC
  Filled 2010-10-11 (×2): qty 1

## 2010-10-11 MED ORDER — MENTHOL 3 MG MT LOZG
1.0000 | LOZENGE | OROMUCOSAL | Status: DC | PRN
  Filled 2010-10-11: qty 9

## 2010-10-11 MED ORDER — CYCLOBENZAPRINE HCL 5 MG PO TABS
5.0000 mg | ORAL_TABLET | Freq: Every day | ORAL | Status: DC | PRN
  Filled 2010-10-11: qty 1

## 2010-10-11 MED ORDER — LACTATED RINGERS IV SOLN
INTRAVENOUS | Status: DC
  Administered 2010-10-11 (×3): via INTRAVENOUS

## 2010-10-11 MED ORDER — LACTATED RINGERS IR SOLN
Status: DC | PRN
  Administered 2010-10-11: 3000 mL

## 2010-10-11 MED ORDER — ONDANSETRON HCL 4 MG PO TABS: 4.0000 mg | ORAL_TABLET | Freq: Four times a day (QID) | ORAL | Status: DC | PRN

## 2010-10-11 MED ORDER — FENTANYL CITRATE 0.05 MG/ML IJ SOLN
INTRAMUSCULAR | Status: AC
  Filled 2010-10-11: qty 2

## 2010-10-11 MED ORDER — ONDANSETRON HCL 4 MG/2ML IJ SOLN
INTRAMUSCULAR | Status: AC
  Filled 2010-10-11: qty 2

## 2010-10-11 MED ORDER — GLYCOPYRROLATE 0.2 MG/ML IJ SOLN
INTRAMUSCULAR | Status: AC
  Filled 2010-10-11: qty 2

## 2010-10-11 MED ORDER — FENTANYL CITRATE 0.05 MG/ML IJ SOLN
INTRAMUSCULAR | Status: AC
  Filled 2010-10-11: qty 5

## 2010-10-11 MED ORDER — LIDOCAINE HCL (CARDIAC) 20 MG/ML IV SOLN
INTRAVENOUS | Status: AC
  Filled 2010-10-11: qty 5

## 2010-10-11 MED ORDER — KETOROLAC TROMETHAMINE 30 MG/ML IJ SOLN
INTRAMUSCULAR | Status: AC
  Filled 2010-10-11: qty 1

## 2010-10-11 MED ORDER — PROPOFOL 10 MG/ML IV EMUL
INTRAVENOUS | Status: AC
  Filled 2010-10-11: qty 20

## 2010-10-11 MED ORDER — PANTOPRAZOLE SODIUM 40 MG PO TBEC
40.0000 mg | DELAYED_RELEASE_TABLET | Freq: Every day | ORAL | Status: DC
  Administered 2010-10-11: 40 mg via ORAL
  Filled 2010-10-11 (×2): qty 1

## 2010-10-11 MED ORDER — KETOROLAC TROMETHAMINE 30 MG/ML IJ SOLN: 30.0000 mg | Freq: Four times a day (QID) | INTRAMUSCULAR | Status: DC

## 2010-10-11 MED ORDER — LISINOPRIL 20 MG PO TABS
20.0000 mg | ORAL_TABLET | Freq: Every day | ORAL | Status: DC
  Filled 2010-10-11 (×2): qty 1

## 2010-10-11 MED ORDER — FENTANYL CITRATE 0.05 MG/ML IJ SOLN
INTRAMUSCULAR | Status: DC | PRN
  Administered 2010-10-11: 50 ug via INTRAVENOUS
  Administered 2010-10-11: 100 ug via INTRAVENOUS
  Administered 2010-10-11 (×3): 50 ug via INTRAVENOUS

## 2010-10-11 MED ORDER — METOCLOPRAMIDE HCL 5 MG/ML IJ SOLN: 10.0000 mg | Freq: Once | INTRAMUSCULAR | Status: DC | PRN

## 2010-10-11 MED ORDER — ONDANSETRON HCL 4 MG/2ML IJ SOLN: 4.0000 mg | Freq: Four times a day (QID) | INTRAMUSCULAR | Status: DC | PRN

## 2010-10-11 MED ORDER — ONDANSETRON HCL 4 MG/2ML IJ SOLN
INTRAMUSCULAR | Status: DC | PRN
  Administered 2010-10-11: 4 mg via INTRAVENOUS

## 2010-10-11 MED ORDER — MIDAZOLAM HCL 2 MG/2ML IJ SOLN
INTRAMUSCULAR | Status: AC
  Filled 2010-10-11: qty 2

## 2010-10-11 MED ORDER — ROCURONIUM BROMIDE 100 MG/10ML IV SOLN
INTRAVENOUS | Status: DC | PRN
  Administered 2010-10-11: 10 mg via INTRAVENOUS
  Administered 2010-10-11: 55 mg via INTRAVENOUS
  Administered 2010-10-11: 5 mg via INTRAVENOUS

## 2010-10-11 MED ORDER — KETOROLAC TROMETHAMINE 60 MG/2ML IM SOLN
INTRAMUSCULAR | Status: AC
  Filled 2010-10-11: qty 2

## 2010-10-11 MED ORDER — SCOPOLAMINE 1 MG/3DAYS TD PT72
MEDICATED_PATCH | TRANSDERMAL | Status: AC
  Filled 2010-10-11: qty 1

## 2010-10-11 MED ORDER — PROPOFOL 10 MG/ML IV EMUL
INTRAVENOUS | Status: DC | PRN
  Administered 2010-10-11: 200 mg via INTRAVENOUS

## 2010-10-11 MED ORDER — LISINOPRIL-HYDROCHLOROTHIAZIDE 20-12.5 MG PO TABS: 1.0000 | ORAL_TABLET | Freq: Every day | ORAL | Status: DC

## 2010-10-11 MED ORDER — LISINOPRIL-HYDROCHLOROTHIAZIDE 20-12.5 MG PO TABS
1.0000 | ORAL_TABLET | Freq: Every day | ORAL | Status: DC
Start: 2010-10-11 — End: 2010-10-11

## 2010-10-11 MED ORDER — ROCURONIUM BROMIDE 50 MG/5ML IV SOLN
INTRAVENOUS | Status: AC
  Filled 2010-10-11: qty 1

## 2010-10-11 MED ORDER — NEOSTIGMINE METHYLSULFATE 1 MG/ML IJ SOLN
INTRAMUSCULAR | Status: AC
  Filled 2010-10-11: qty 10

## 2010-10-11 MED ORDER — DEXAMETHASONE SODIUM PHOSPHATE 10 MG/ML IJ SOLN
INTRAMUSCULAR | Status: AC
  Filled 2010-10-11: qty 1

## 2010-10-11 MED ORDER — INFLUENZA VIRUS VACC SPLIT PF IM SUSP
0.5000 mL | Freq: Once | INTRAMUSCULAR | Status: AC
  Administered 2010-10-11: 0.5 mL via INTRAMUSCULAR
  Filled 2010-10-11: qty 0.5

## 2010-10-11 MED ORDER — INDIGOTINDISULFONATE SODIUM 8 MG/ML IJ SOLN
INTRAMUSCULAR | Status: DC | PRN
  Administered 2010-10-11: 40 mg via INTRAVENOUS

## 2010-10-11 MED ORDER — DEXAMETHASONE SODIUM PHOSPHATE 4 MG/ML IJ SOLN
INTRAMUSCULAR | Status: DC | PRN
  Administered 2010-10-11: 10 mg via INTRAVENOUS

## 2010-10-11 MED ORDER — LIDOCAINE HCL (CARDIAC) 20 MG/ML IV SOLN
INTRAVENOUS | Status: DC | PRN
  Administered 2010-10-11: 100 mg via INTRAVENOUS

## 2010-10-11 MED ORDER — IBUPROFEN 800 MG PO TABS
800.0000 mg | ORAL_TABLET | Freq: Three times a day (TID) | ORAL | Status: DC | PRN
  Administered 2010-10-11 – 2010-10-12 (×2): 800 mg via ORAL
  Filled 2010-10-11 (×3): qty 1

## 2010-10-11 MED ORDER — STERILE WATER FOR IRRIGATION IR SOLN
Status: DC | PRN
  Administered 2010-10-11: 1000 mL

## 2010-10-11 MED ORDER — PHENOL 1.4 % MT LIQD
1.0000 | OROMUCOSAL | Status: DC | PRN
  Filled 2010-10-11: qty 177

## 2010-10-11 SURGICAL SUPPLY — 67 items
BAG URINE DRAINAGE (UROLOGICAL SUPPLIES) ×4 IMPLANT
BARRIER ADHS 3X4 INTERCEED (GAUZE/BANDAGES/DRESSINGS) ×4 IMPLANT
BENZOIN TINCTURE PRP APPL 2/3 (GAUZE/BANDAGES/DRESSINGS) ×4 IMPLANT
BLADELESS LONG 8MM (BLADE) ×4 IMPLANT
CABLE HIGH FREQUENCY MONO STRZ (ELECTRODE) ×4 IMPLANT
CATH FOLEY 3WAY  5CC 16FR (CATHETERS) ×1
CATH FOLEY 3WAY 5CC 16FR (CATHETERS) ×3 IMPLANT
CHLORAPREP W/TINT 26ML (MISCELLANEOUS) ×4 IMPLANT
CLOTH BEACON ORANGE TIMEOUT ST (SAFETY) ×4 IMPLANT
CONT PATH 16OZ SNAP LID 3702 (MISCELLANEOUS) ×4 IMPLANT
COVER MAYO STAND STRL (DRAPES) ×4 IMPLANT
COVER TABLE BACK 60X90 (DRAPES) ×8 IMPLANT
COVER TIP SHEARS 8 DVNC (MISCELLANEOUS) ×3 IMPLANT
COVER TIP SHEARS 8MM DA VINCI (MISCELLANEOUS) ×1
DECANTER SPIKE VIAL GLASS SM (MISCELLANEOUS) IMPLANT
DERMABOND ADVANCED (GAUZE/BANDAGES/DRESSINGS) ×1
DERMABOND ADVANCED .7 DNX12 (GAUZE/BANDAGES/DRESSINGS) ×3 IMPLANT
DRAPE HUG U DISPOSABLE (DRAPE) ×4 IMPLANT
DRAPE HYSTEROSCOPY (DRAPE) ×4 IMPLANT
DRAPE LG THREE QUARTER DISP (DRAPES) ×8 IMPLANT
DRAPE MONITOR DA VINCI (DRAPE) ×4 IMPLANT
DRAPE ROBOTICS STRL (DRAPES) ×4 IMPLANT
DRAPE WARM FLUID 44X44 (DRAPE) ×4 IMPLANT
ELECT REM PT RETURN 9FT ADLT (ELECTROSURGICAL) ×4
ELECTRODE REM PT RTRN 9FT ADLT (ELECTROSURGICAL) ×3 IMPLANT
EVACUATOR SMOKE 8.L (FILTER) ×4 IMPLANT
GAUZE VASELINE 3X9 (GAUZE/BANDAGES/DRESSINGS) IMPLANT
GLOVE BIO SURGEON STRL SZ7 (GLOVE) ×12 IMPLANT
GLOVE ECLIPSE 6.5 STRL STRAW (GLOVE) ×12 IMPLANT
GOWN PREVENTION PLUS LG XLONG (DISPOSABLE) ×16 IMPLANT
GYRUS RUMI II 3.5CM BLUE (DISPOSABLE) ×4
KIT DISP ACCESSORY 4 ARM (KITS) ×4 IMPLANT
MANIPULATOR UTERINE 4.5 ZUMI (MISCELLANEOUS) IMPLANT
NEEDLE INSUFFLATION 14GA 120MM (NEEDLE) ×4 IMPLANT
NEEDLE INSUFFLATION 14GA 150MM (NEEDLE) ×4 IMPLANT
NS IRRIG 1000ML POUR BTL (IV SOLUTION) IMPLANT
OCCLUDER COLPOPNEUMO (BALLOONS) ×4 IMPLANT
PACK LAVH (CUSTOM PROCEDURE TRAY) ×4 IMPLANT
PAD PREP 24X48 CUFFED NSTRL (MISCELLANEOUS) ×8 IMPLANT
PLUG CATH AND CAP STER (CATHETERS) ×4 IMPLANT
POSITIONER SURGICAL ARM (MISCELLANEOUS) ×8 IMPLANT
RUMI II GYRUS 3.5CM BLUE (DISPOSABLE) ×3 IMPLANT
SET CYSTO W/LG BORE CLAMP LF (SET/KITS/TRAYS/PACK) ×8 IMPLANT
SET IRRIG TUBING LAPAROSCOPIC (IRRIGATION / IRRIGATOR) ×4 IMPLANT
SOLUTION ELECTROLUBE (MISCELLANEOUS) ×4 IMPLANT
SPONGE LAP 18X18 X RAY DECT (DISPOSABLE) IMPLANT
STRIP CLOSURE SKIN 1/2X4 (GAUZE/BANDAGES/DRESSINGS) ×4 IMPLANT
SUT VIC AB 0 CT1 27 (SUTURE) ×5
SUT VIC AB 0 CT1 27XBRD ANBCTR (SUTURE) ×15 IMPLANT
SUT VIC AB 2-0 CT2 27 (SUTURE) ×8 IMPLANT
SUT VICRYL 0 UR6 27IN ABS (SUTURE) ×8 IMPLANT
SUT VICRYL RAPIDE 3 0 (SUTURE) ×8 IMPLANT
SYR 50ML LL SCALE MARK (SYRINGE) ×4 IMPLANT
SYSTEM CONVERTIBLE TROCAR (TROCAR) IMPLANT
TIP UTERINE 5.1X6CM LAV DISP (MISCELLANEOUS) IMPLANT
TIP UTERINE 6.7X10CM GRN DISP (MISCELLANEOUS) ×4 IMPLANT
TIP UTERINE 6.7X6CM WHT DISP (MISCELLANEOUS) IMPLANT
TIP UTERINE 6.7X8CM BLUE DISP (MISCELLANEOUS) IMPLANT
TOWEL OR 17X24 6PK STRL BLUE (TOWEL DISPOSABLE) ×8 IMPLANT
TRAY FOLEY BAG SILVER LF 14FR (CATHETERS) IMPLANT
TROCAR DISP BLADELESS 8 DVNC (TROCAR) ×3 IMPLANT
TROCAR DISP BLADELESS 8MM (TROCAR) ×1
TROCAR XCEL 12X100 BLDLESS (ENDOMECHANICALS) ×4 IMPLANT
TROCAR Z-THREAD 12X150 (TROCAR) IMPLANT
TROCAR Z-THREAD BLADED 12X100M (TROCAR) IMPLANT
TUBING FILTER THERMOFLATOR (ELECTROSURGICAL) ×4 IMPLANT
WATER STERILE IRR 1000ML POUR (IV SOLUTION) ×12 IMPLANT

## 2010-10-11 NOTE — Transfer of Care (Signed)
Immediate Anesthesia Transfer of Care Note  Patient: Beth Wallace  Procedure(s) Performed:  ROBOTIC ASSISTED TOTAL HYSTERECTOMY; CYSTOSCOPY  Patient Location: PACU  Anesthesia Type: General  Level of Consciousness: awake, alert , oriented and patient cooperative  Airway & Oxygen Therapy: Patient Spontanous Breathing and Patient connected to nasal cannula oxygen  Post-op Assessment: Report given to PACU RN and Post -op Vital signs reviewed and stable  Post vital signs: Reviewed and stable  Complications: No apparent anesthesia complications

## 2010-10-11 NOTE — Preoperative (Signed)
Beta Blockers   Reason not to administer Beta Blockers:Not Applicable 

## 2010-10-11 NOTE — Anesthesia Postprocedure Evaluation (Signed)
  Anesthesia Post-op Note  Patient: Beth Wallace  Procedure(s) Performed:  ROBOTIC ASSISTED TOTAL HYSTERECTOMY; CYSTOSCOPY  Patient Location: PACU and Women's Unit  Anesthesia Type: General  Level of Consciousness: awake, alert  and oriented  Airway and Oxygen Therapy: Patient Spontanous Breathing and Patient connected to nasal cannula oxygen  Post-op Pain: mild  Post-op Assessment: Post-op Vital signs reviewed and Patient's Cardiovascular Status Stable  Post-op Vital Signs: Reviewed and stable  Complications: No apparent anesthesia complications

## 2010-10-11 NOTE — Brief Op Note (Signed)
10/11/2010  9:33 AM  PATIENT:  Butler Denmark  45 y.o. female  PRE-OPERATIVE DIAGNOSIS:  fibroids  POST-OPERATIVE DIAGNOSIS:  Fibroids  PROCEDURE:  Procedure(s): ROBOTIC ASSISTED TOTAL HYSTERECTOMY CYSTOSCOPY  SURGEON:  Surgeon(s): Elray Mcgregor  PHYSICIAN ASSISTANT:   ASSISTANTS: Dr. Greta Doom.   ANESTHESIA:   general  OR FLUID I/O:  Total I/O In: 1000 [I.V.:1000] Out: 155 [Urine:150; Blood:5]  BLOOD Loss:  100 cc  Medications:  Ancef, Indigo Carmine  SPECIMEN:  Source of Specimen:  uterus and cervix  DISPOSITION OF SPECIMEN:  PATHOLOGY  COUNTS:  YES  DICTATION: .see below.  PLAN OF CARE: Admit for overnight observation  PATIENT DISPOSITION:  PACU - hemodynamically stable.   Delay start of Pharmacological VTE agent (>24hrs) due to surgical blood loss or risk of bleeding:  not applicable              Complications:  None.  Findings:  Uterus with large fundal fibroid, weighing 215 gm.  Ovaries were normal bilaterally.  The ureters were identified during multiple points of the case and were always out of the field of dissection.  On cystoscopy, the bladder was intact and bilateral spill was seen from each ureteral orriface.    Medications:  Ancef.  Indigo carmine.    Technique:  After adequate anesthesia was achieved the patient was positioned, prepped and draped in usual sterile fashion.  A speculum was placed in the vagina and the cervix dilated with pratt dilators.  The 10 cm Rumi and 3.5 cm Koh ring were assembled and placed in proper fashion after a stitch was placed on the cervix for traction.  The  Speculum was removed and the bladder catheterized with a foley.    Attention was turned to the abdomen where a 1 cm incision was made just above the umbilicus.  The veress needle was inserted without aspiration of bowel contents or blood.  The long 12 mm trocar was placed and the other four trocar sites were marked out, all  approximately 10 cm from each other and the camera.  Three 8.5 mm trocars were placed on either side of the camera port and 3 cm above the iliac crest on the right side.  A 5 mm assistant port was placed opposite the left lower trocar.  All trocars were inserted under direct visualization of the camera.  The patient was placed in trendelenburg and then the Robot docked.  The PK forceps were placed on arm 2, the Prograsp on arm 3 and the Hot shears on arm 1 and introduced under direct visualization of the camera  I then broke scrub and sat down at the console.  The above findings were noted and the ureters identified well out of the field of dissection.  The right fallopian tube was isolated and cauterized with the PK.  The Utero-ovarian ligament was then divided with the PK cautery and shears.  The posterior broad ligament was then divided with the hot shears until the uterosacral ligament.  The Broad and cardinal ligaments were then cauterized against the cervix to the level of the Koh ring, securing the uterine artery.  Each pedicle was then incised with the shears.  The anterior leaf was then incised at the reflection of the vessico-uterine junction and the lateral bladder retracted inferiorly after the round ligament had been divided with the PK forceps.  The left tube was cauterized with the PK and divided with the shears;  then the left utero-ovarian ligament divided  with the PK forceps and the scissors.  The round ligament was divided as well and the posterior leaf of the broad ligament then divided with the hot shears. The broad and cardinal ligaments were then cauterized on the left in the same way.   At the level of the internal os, the uterine arteries were bilaterally cauterized with the PK.  The ureters were identified well out of the field of dissection.     The bladder was then able to be retracted inferiorly and the vesico-uterine fascia was incised in the midline until the bladder was removed  one cm below the Koh ring.  The hot shears then circumferentially incised the vagina at the level of the reflection on the [facility] ring.  Once the uterus and cervix were amputated, cautery was used to insure hemostasis of the cuff.  The specimen was removed through the vagina.  Once hemostasis was achieved, the instruments were changed to the long forceps and mega suture cut needle driver and the cuff was closed with one 0-Vloc suture in a running fashion, securing the end with 2 double back sutures.    The Robot was then undocked and I scrubbed back in.   Irrigation was performed and all instruments removed after the abdomen had been carefully desufflated.  The 12 mm camera port was closed with a figure of eight stitch of 2-0 vicryl.  The skin incisions were closed with subcuticular stitches of 3-0 vicryl Rapide and Dermabond.  All instruments were removed from the vagina and cystoscopy performed, revealing an intact bladder and spill of indigo carmine from each ureteral orifice.  The cystoscope was removed and the patient taken to the recovery room in stable condition.  Betzalel Umbarger A

## 2010-10-11 NOTE — Progress Notes (Signed)
Patient is eating, ambulating, and voiding.  Pain control is good.  BP 128/78  Pulse 77  Temp(Src) 98.6 F (37 C) (Oral)  Resp 16  Ht 5' 7.5" (1.715 m)  Wt 120.657 kg (266 lb)  BMI 41.05 kg/m2  SpO2 96%  lungs:   clear to auscultation cor:    RRR Abdomen:  soft, appropriate tenderness, incisions intact and without erythema or exudate. ex:    no cords   Lab Results  Component Value Date   WBC 6.6 10/04/2010   HGB 14.2 10/04/2010   HCT 43.3 10/04/2010   MCV 91.0 10/04/2010   PLT 303 10/04/2010    A/P  Routine care.  Expect d/c per plan.   Markita Stcharles A

## 2010-10-11 NOTE — Anesthesia Postprocedure Evaluation (Signed)
Anesthesia Post Note  Patient: Beth Wallace  Procedure(s) Performed:  ROBOTIC ASSISTED TOTAL HYSTERECTOMY; CYSTOSCOPY  Anesthesia type: General  Patient location: PACU  Post pain: Pain level controlled  Post assessment: Post-op Vital signs reviewed  Last Vitals:  Filed Vitals:   10/11/10 1030  BP: 138/69  Pulse: 74  Temp:   Resp:     Post vital signs: Reviewed  Level of consciousness: sedated  Complications: No apparent anesthesia complicationsfj

## 2010-10-11 NOTE — Op Note (Signed)
10/11/2010  9:33 AM  PATIENT:  Beth Wallace  45 y.o. female  PRE-OPERATIVE DIAGNOSIS:  fibroids  POST-OPERATIVE DIAGNOSIS:  Fibroids  PROCEDURE:  Procedure(s): ROBOTIC ASSISTED TOTAL HYSTERECTOMY CYSTOSCOPY  SURGEON:  Surgeon(s): Arlone Lenhardt A Moriah Loughry W Scott Bowie  PHYSICIAN ASSISTANT:   ASSISTANTS: Dr. Bowie.   ANESTHESIA:   general  OR FLUID I/O:  Total I/O In: 1000 [I.V.:1000] Out: 155 [Urine:150; Blood:5]  BLOOD Loss:  100 cc  Medications:  Ancef, Indigo Carmine  SPECIMEN:  Source of Specimen:  uterus and cervix  DISPOSITION OF SPECIMEN:  PATHOLOGY  COUNTS:  YES  DICTATION: .see below.  PLAN OF CARE: Admit for overnight observation  PATIENT DISPOSITION:  PACU - hemodynamically stable.   Delay start of Pharmacological VTE agent (>24hrs) due to surgical blood loss or risk of bleeding:  not applicable              Complications:  None.  Findings:  Uterus with large fundal fibroid, weighing 215 gm.  Ovaries were normal bilaterally.  The ureters were identified during multiple points of the case and were always out of the field of dissection.  On cystoscopy, the bladder was intact and bilateral spill was seen from each ureteral orriface.    Medications:  Ancef.  Indigo carmine.    Technique:  After adequate anesthesia was achieved the patient was positioned, prepped and draped in usual sterile fashion.  A speculum was placed in the vagina and the cervix dilated with pratt dilators.  The 10 cm Rumi and 3.5 cm Koh ring were assembled and placed in proper fashion after a stitch was placed on the cervix for traction.  The  Speculum was removed and the bladder catheterized with a foley.    Attention was turned to the abdomen where a 1 cm incision was made just above the umbilicus.  The veress needle was inserted without aspiration of bowel contents or blood.  The long 12 mm trocar was placed and the other four trocar sites were marked out, all  approximately 10 cm from each other and the camera.  Three 8.5 mm trocars were placed on either side of the camera port and 3 cm above the iliac crest on the right side.  A 5 mm assistant port was placed opposite the left lower trocar.  All trocars were inserted under direct visualization of the camera.  The patient was placed in trendelenburg and then the Robot docked.  The PK forceps were placed on arm 2, the Prograsp on arm 3 and the Hot shears on arm 1 and introduced under direct visualization of the camera  I then broke scrub and sat down at the console.  The above findings were noted and the ureters identified well out of the field of dissection.  The right fallopian tube was isolated and cauterized with the PK.  The Utero-ovarian ligament was then divided with the PK cautery and shears.  The posterior broad ligament was then divided with the hot shears until the uterosacral ligament.  The Broad and cardinal ligaments were then cauterized against the cervix to the level of the Koh ring, securing the uterine artery.  Each pedicle was then incised with the shears.  The anterior leaf was then incised at the reflection of the vessico-uterine junction and the lateral bladder retracted inferiorly after the round ligament had been divided with the PK forceps.  The left tube was cauterized with the PK and divided with the shears;  then the left utero-ovarian ligament divided   with the PK forceps and the scissors.  The round ligament was divided as well and the posterior leaf of the broad ligament then divided with the hot shears. The broad and cardinal ligaments were then cauterized on the left in the same way.   At the level of the internal os, the uterine arteries were bilaterally cauterized with the PK.  The ureters were identified well out of the field of dissection.     The bladder was then able to be retracted inferiorly and the vesico-uterine fascia was incised in the midline until the bladder was removed  one cm below the Koh ring.  The hot shears then circumferentially incised the vagina at the level of the reflection on the Koh ring.  Once the uterus and cervix were amputated, cautery was used to insure hemostasis of the cuff.  The specimen was removed through the vagina.  Once hemostasis was achieved, the instruments were changed to the long forceps and mega suture cut needle driver and the cuff was closed with one 0-Vloc suture in a running fashion, securing the end with 2 double back sutures.    The Robot was then undocked and I scrubbed back in.   Irrigation was performed and all instruments removed after the abdomen had been carefully desufflated.  The 12 mm camera port was closed with a figure of eight stitch of 2-0 vicryl.  The skin incisions were closed with subcuticular stitches of 3-0 vicryl Rapide and Dermabond.  All instruments were removed from the vagina and cystoscopy performed, revealing an intact bladder and spill of indigo carmine from each ureteral orifice.  The cystoscope was removed and the patient taken to the recovery room in stable condition.  Lameshia Hypolite A    

## 2010-10-11 NOTE — Progress Notes (Signed)
There has been no change in the patients history or status since the history and physical.  Filed Vitals:   10/11/10 0612  BP: 132/82  Temp: 98.2 F (36.8 C)  TempSrc: Oral  Resp: 18  SpO2: 98%    Lab Results  Component Value Date   WBC 6.6 10/04/2010   HGB 14.2 10/04/2010   HCT 43.3 10/04/2010   MCV 91.0 10/04/2010   PLT 303 10/04/2010    Joellen Tullos A

## 2010-10-12 LAB — CBC
Hemoglobin: 12.3 g/dL (ref 12.0–15.0)
MCH: 29.6 pg (ref 26.0–34.0)
MCV: 90.1 fL (ref 78.0–100.0)
RBC: 4.15 MIL/uL (ref 3.87–5.11)

## 2010-10-12 MED ORDER — IBUPROFEN 800 MG PO TABS
800.0000 mg | ORAL_TABLET | Freq: Three times a day (TID) | ORAL | Status: AC | PRN
Start: 2010-10-12 — End: 2010-10-22

## 2010-10-12 MED ORDER — OXYCODONE-ACETAMINOPHEN 5-325 MG PO TABS: 1.0000 | ORAL_TABLET | ORAL | Status: AC | PRN

## 2010-10-12 NOTE — Discharge Summary (Signed)
Physician Discharge Summary  Patient ID: Beth Wallace MRN: [id number] DOB/AGE: 19-Jan-1965 45 y.o.  Admit date: 10/11/2010 Discharge date: 10/12/2010  Admission Diagnoses: Symptomatic fibroid uterus  Discharge Diagnoses: Same  Discharged Condition: good  Hospital Course: Uncomplicated O/N course after ROBO TLH.  Consults: none  Significant Diagnostic Studies: labs:  Lab Results  Component Value Date   WBC 6.6 10/04/2010   HGB 14.2 10/04/2010   HCT 43.3 10/04/2010   MCV 91.0 10/04/2010   PLT 303 10/04/2010    Treatments: surgery: ROBO TLH  Discharge Exam: Blood pressure 119/70, pulse 67, temperature 97.7 F (36.5 C), temperature source Oral, resp. rate 18, height 5' 7.5" (1.715 m), weight 120.657 kg (266 lb), SpO2 96.00%. see progress note.  Disposition: Home or Self Care  Discharge Orders    Future Orders Please Complete By Expires   Diet - low sodium heart healthy      Discharge instructions      Comments:   No driving on narcotics, no sexual activity for 2 weeks.   Increase activity slowly      May shower / Bathe      Comments:   Shower, no bath for 2 weeks.   Sexual Activity Restrictions      Comments:   No sexual activity for 2 weeks.   Remove dressing in 24 hours      Call MD for:  temperature >100.4        Current Discharge Medication List    START taking these medications   Details  !! ibuprofen (ADVIL,MOTRIN) 800 MG tablet Take 1 tablet (800 mg total) by mouth every 8 (eight) hours as needed for pain (mild pain). Qty: 30 tablet, Refills: 0    oxyCODONE-acetaminophen (PERCOCET) 5-325 MG per tablet Take 1-2 tablets by mouth every 4 (four) hours as needed (moderate to severe pain (when tolerating fluids)). Qty: 30 tablet, Refills: 0     !! - Potential duplicate medications found. Please discuss with provider.    CONTINUE these medications which have NOT CHANGED   Details  cyclobenzaprine (FLEXERIL) 10 MG tablet Take 5 mg by mouth daily as needed.  Muscle spasms    fluticasone (FLONASE) 50 MCG/ACT nasal spray Place 1 spray into the nose daily as needed. For allergies     !! ibuprofen (ADVIL,MOTRIN) 800 MG tablet Take 800 mg by mouth every 8 (eight) hours as needed. For cramps     lisinopril-hydrochlorothiazide (PRINZIDE,ZESTORETIC) 20-12.5 MG per tablet Take 1 tablet by mouth daily.      omeprazole (PRILOSEC) 20 MG capsule Take 20 mg by mouth daily as needed. For heartburn     LORazepam (ATIVAN) 0.5 MG tablet Take 0.5 mg by mouth every 8 (eight) hours.       !! - Potential duplicate medications found. Please discuss with provider.    STOP taking these medications     levonorgestrel (MIRENA) 20 MCG/24HR IUD        Follow-up Information    Follow up with Stephene Alegria A. Make an appointment in 2 weeks.   Contact information:   [address] [place] [address] [phone]          Signed: Loney Laurence 10/12/2010, 5:31 AM

## 2010-10-12 NOTE — Progress Notes (Signed)
Patient is eating, ambulating, and voiding.  Pain control is good.  BP 119/70  Pulse 67  Temp(Src) 97.7 F (36.5 C) (Oral)  Resp 18  Ht 5' 7.5" (1.715 m)  Wt 120.657 kg (266 lb)  BMI 41.05 kg/m2  SpO2 96%  lungs:   clear to auscultation cor:    RRR Abdomen:  soft, appropriate tenderness, incisions intact and without erythema or exudate. ex:    no cords   Lab Results  Component Value Date   WBC 6.6 10/04/2010   HGB 14.2 10/04/2010   HCT 43.3 10/04/2010   MCV 91.0 10/04/2010   PLT 303 10/04/2010    A/P  Routine care.  Expect d/c per plan.   Dorn Hartshorne A

## 2010-10-16 ENCOUNTER — Encounter (HOSPITAL_COMMUNITY): Payer: Self-pay | Admitting: Obstetrics and Gynecology

## 2010-10-23 ENCOUNTER — Telehealth: Payer: Self-pay | Admitting: *Deleted

## 2010-10-23 MED ORDER — LISINOPRIL-HYDROCHLOROTHIAZIDE 20-12.5 MG PO TABS: 1.0000 | ORAL_TABLET | Freq: Every day | ORAL | Status: DC

## 2010-10-23 NOTE — Telephone Encounter (Signed)
rx sent to pharmacy

## 2010-10-30 ENCOUNTER — Encounter (HOSPITAL_COMMUNITY): Payer: Self-pay | Admitting: *Deleted

## 2010-10-30 ENCOUNTER — Emergency Department (HOSPITAL_COMMUNITY): Payer: 59

## 2010-10-30 ENCOUNTER — Inpatient Hospital Stay (HOSPITAL_COMMUNITY)
Admission: AD | Admit: 2010-10-30 | Discharge: 2010-10-31 | Disposition: A | Discharge status: Home / Self Care | Payer: 59 | Source: Ambulatory Visit | Attending: Obstetrics and Gynecology | Admitting: Obstetrics and Gynecology

## 2010-10-30 ENCOUNTER — Emergency Department (HOSPITAL_COMMUNITY)
Admission: EM | Admit: 2010-10-30 | Discharge: 2010-10-30 | Disposition: A | Discharge status: Other Acute Inpatient Hospital | Payer: 59 | Source: Home / Self Care | Attending: Emergency Medicine | Admitting: Emergency Medicine

## 2010-10-30 DIAGNOSIS — R1031 Right lower quadrant pain: Secondary | ICD-10-CM | POA: Insufficient documentation

## 2010-10-30 DIAGNOSIS — Z9079 Acquired absence of other genital organ(s): Secondary | ICD-10-CM | POA: Insufficient documentation

## 2010-10-30 DIAGNOSIS — K5900 Constipation, unspecified: Secondary | ICD-10-CM | POA: Insufficient documentation

## 2010-10-30 DIAGNOSIS — N83209 Unspecified ovarian cyst, unspecified side: Secondary | ICD-10-CM | POA: Insufficient documentation

## 2010-10-30 DIAGNOSIS — N898 Other specified noninflammatory disorders of vagina: Secondary | ICD-10-CM | POA: Insufficient documentation

## 2010-10-30 DIAGNOSIS — N939 Abnormal uterine and vaginal bleeding, unspecified: Secondary | ICD-10-CM

## 2010-10-30 DIAGNOSIS — K219 Gastro-esophageal reflux disease without esophagitis: Secondary | ICD-10-CM | POA: Insufficient documentation

## 2010-10-30 DIAGNOSIS — IMO0002 Reserved for concepts with insufficient information to code with codable children: Secondary | ICD-10-CM

## 2010-10-30 LAB — DIFFERENTIAL
Basophils Absolute: 0 10*3/uL (ref 0.0–0.1)
Eosinophils Absolute: 0.1 10*3/uL (ref 0.0–0.7)
Eosinophils Relative: 1 % (ref 0–5)
Lymphs Abs: 3.2 10*3/uL (ref 0.7–4.0)
Monocytes Absolute: 0.6 10*3/uL (ref 0.1–1.0)

## 2010-10-30 LAB — BASIC METABOLIC PANEL
Calcium: 9.1 mg/dL (ref 8.4–10.5)
Creatinine, Ser: 0.62 mg/dL (ref 0.50–1.10)
GFR calc non Af Amer: >90 mL/min (ref >90)
Glucose, Bld: 88 mg/dL (ref 70–99)
Sodium: 139 mEq/L (ref 135–145)

## 2010-10-30 LAB — URINALYSIS, ROUTINE W REFLEX MICROSCOPIC
Bilirubin Urine: NEGATIVE
Ketones, ur: NEGATIVE mg/dL
Nitrite: NEGATIVE
Urobilinogen, UA: 0.2 mg/dL (ref 0.0–1.0)

## 2010-10-30 LAB — URINE MICROSCOPIC-ADD ON

## 2010-10-30 LAB — CBC
HCT: 39.4 % (ref 36.0–46.0)
MCH: 29.5 pg (ref 26.0–34.0)
MCV: 88.7 fL (ref 78.0–100.0)
Platelets: 372 10*3/uL (ref 150–400)
RDW: 13.4 % (ref 11.5–15.5)

## 2010-10-30 MED ORDER — HYDROMORPHONE HCL 1 MG/ML IJ SOLN
1.0000 mg | Freq: Once | INTRAMUSCULAR | Status: AC
  Administered 2010-10-30: 1 mg via INTRAVENOUS
  Filled 2010-10-30: qty 1

## 2010-10-30 MED ORDER — ONDANSETRON HCL 4 MG/2ML IJ SOLN
4.0000 mg | Freq: Once | INTRAMUSCULAR | Status: AC
  Administered 2010-10-30: 4 mg via INTRAVENOUS
  Filled 2010-10-30: qty 2

## 2010-10-30 MED ORDER — SODIUM CHLORIDE 0.9 % IV BOLUS (SEPSIS)
1000.0000 mL | Freq: Once | INTRAVENOUS | Status: AC
  Administered 2010-10-30 (×2): 1000 mL via INTRAVENOUS

## 2010-10-30 MED ORDER — SODIUM CHLORIDE 0.9 % IV SOLN: INTRAVENOUS | Status: DC

## 2010-10-30 NOTE — ED Notes (Signed)
[facility].

## 2010-10-30 NOTE — ED Notes (Signed)
Medications given as ordered---pt. Tolerated well and stated she had immediate relief of symptoms

## 2010-10-30 NOTE — ED Notes (Signed)
Report given to [facility] 

## 2010-10-30 NOTE — ED Notes (Signed)
Thhis is a duplicate order

## 2010-10-30 NOTE — ED Notes (Signed)
C/O Right lower quadrant pain onset one week following partial hysterectomy---was told by her OB/GYN that she has two right ovarian cysts which were seen on ultrasound--Also now is having some bright red vaginal bleeding which she describes as scant--rates her pain a 10 on 1-10 scale.

## 2010-10-30 NOTE — ED Notes (Signed)
Had partial hysterectomy 10/11/2010 - Vaginal bleeding x 10 days.  States bleeding started out brownish color, now bright red.  Was told on 10/24/2010 that she had 2 cysts on right ovary.  C/o severe pain to right side.

## 2010-10-30 NOTE — ED Notes (Signed)
Pt states her abdominal pain is much better at this time. Pt is drinking contrast for scan at this time. No needs voiced at this time.

## 2010-10-30 NOTE — Progress Notes (Signed)
Pt presents via [facility] from [facility] for abdominal pain.  Status post hysterectomy on 9/26. Had some bleeding today and pain mostly in right side.

## 2010-10-30 NOTE — ED Provider Notes (Signed)
History     CSN: [id number] Arrival date & time: 10/30/2010  5:23 PM  Chief Complaint  Patient presents with  . Vaginal Bleeding    (Consider location/radiation/quality/duration/timing/severity/associated sxs/prior treatment) HPI:  Level V caveat urgent need for intervention. Patient is status post hysterectomy on 10/12/2010 by Dr. Henderson Cloud. She complains of persistent right lower quadrant pain and increasing vaginal bleeding. She has been seen by her gynecologist recently in the office. An ultrasound showed ovarian cysts. No fever, chills, dysuria. She is able to eat.  She presents to the ER with increasing pain.  Past Medical History  Diagnosis Date  . Hypertension in pregnancy     on meds 1990  . GERD (gastroesophageal reflux disease)   . Anxiety     Past Surgical History  Procedure Date  . Cholecystectomy 1989  . Cesarean section   . Wisdom tooth extraction 2010  . Abdominal hysterectomy 09/2010    partial    Family History  Problem Relation Age of Onset  . Heart disease Mother   . Hypertension Father   . Diabetes Father     History  Substance Use Topics  . Smoking status: Current Everyday Smoker -- 0.3 packs/day    Types: Cigarettes  . Smokeless tobacco: Not on file  . Alcohol Use: No    OB History    Grav Para Term Preterm Abortions TAB SAB Ect Mult Living                  Review of Systems  Unable to perform ROS   Allergies  Bee venom; Contrast media; and Iohexol  Home Medications   Current Outpatient Rx  Name Route Sig Dispense Refill  . CEFUROXIME AXETIL 500 MG PO TABS Oral Take 500 mg by mouth 2 (two) times daily. For 7 days     . CYCLOBENZAPRINE HCL 10 MG PO TABS Oral Take 5 mg by mouth daily as needed. Muscle spasms    . FLUTICASONE PROPIONATE 50 MCG/ACT NA SUSP Nasal Place 1 spray into the nose daily as needed. For allergies     . IBUPROFEN 800 MG PO TABS Oral Take 800 mg by mouth every 8 (eight) hours as needed. For cramps     .  LISINOPRIL-HYDROCHLOROTHIAZIDE 20-12.5 MG PO TABS Oral Take 1 tablet by mouth daily. 90 tablet 1  . OMEPRAZOLE 20 MG PO CPDR Oral Take 20 mg by mouth daily as needed. For heartburn     . OXYCODONE-ACETAMINOPHEN 5-325 MG PO TABS Oral Take 1 tablet by mouth every 4 (four) hours as needed. For pain     . LORAZEPAM 0.5 MG PO TABS Oral Take 0.5 mg by mouth every 8 (eight) hours.        BP 154/80  Pulse 85  Temp(Src) 98.5 F (36.9 C) (Oral)  Resp 20  Ht 5\' 7"  (1.702 m)  Wt 258 lb (117.028 kg)  BMI 40.41 kg/m2  SpO2 98%  Physical Exam  Nursing note and vitals reviewed. Constitutional: She is oriented to person, place, and time. She appears well-developed and well-nourished.       Obese, moderate distress  HENT:  Head: Normocephalic and atraumatic.  Eyes: Conjunctivae and EOM are normal. Pupils are equal, round, and reactive to light.  Neck: Normal range of motion. Neck supple.  Cardiovascular: Normal rate and regular rhythm.   Pulmonary/Chest: Effort normal and breath sounds normal.  Abdominal: Soft. Bowel sounds are normal.       Minimal tenderness right lower cord  Genitourinary:       External genitalia normal. Slight amount blood in the vault. Cervical os closed.  Minimal right adnexa tenderness  Musculoskeletal: Normal range of motion.  Neurological: She is alert and oriented to person, place, and time.  Skin: Skin is warm and dry.  Psychiatric: She has a normal mood and affect.    ED Course  Procedures (including critical care time)  Labs Reviewed  URINALYSIS, ROUTINE W REFLEX MICROSCOPIC - Abnormal; Notable for the following:    Hgb urine dipstick MODERATE (*)    All other components within normal limits  URINE MICROSCOPIC-ADD ON - Abnormal; Notable for the following:    Squamous Epithelial / LPF FEW (*)    All other components within normal limits  PREGNANCY, URINE  CBC  DIFFERENTIAL  BASIC METABOLIC PANEL   Ct Abdomen Pelvis Wo Contrast  10/30/2010  *RADIOLOGY  REPORT*  Clinical Data: Status post left.  Hysterectomy.  Right-sided abdominal pain.  CT ABDOMEN AND PELVIS WITHOUT CONTRAST  Technique:  Multidetector CT imaging of the abdomen and pelvis was performed following the standard protocol without intravenous contrast.  Comparison: None.  Findings: Minimal dependent atelectasis in the lung bases.  No effusions.  Heart is normal size.  Prior cholecystectomy.  Liver, spleen, pancreas, adrenals and kidneys are unremarkable.  There is a normal retrocecal appendix. Large and small bowel grossly unremarkable.  There is a fluid collection near the midline of the pelvis just superior to the bladder.  This measures 5.6 x 3.8 cm.  Given the recent hysterectomy, postoperative seroma, hematoma, or abscess are possible.  This also could be an adnexal cyst.  Differentiation is difficult without IV contrast.  Pelvic ultrasound may be beneficial for further evaluation.  Urinary bladder is unremarkable.  IMPRESSION: 5.6 cm fluid collection in the midline of the pelvis just superior to the bladder with differential considerations as above. Characterization without IV contrast is difficult.  This can be further evaluated with pelvic ultrasound if felt clinically indicated.  Normal retrocecal appendix.  Original Report Authenticated By: Cyndie Chime, M.D.     No diagnosis found.    MDM  CT scan shows a 5.6 CM fluid collection in the midline the pelvis. I discussed this finding with the doctor on call Dr. Claiborne Billings. Will admit patient to [facility].  IV fluids and pain medicine given.        Donnetta Hutching, MD 10/30/10 2200

## 2010-10-31 ENCOUNTER — Inpatient Hospital Stay (HOSPITAL_COMMUNITY): Payer: 59

## 2010-10-31 MED ORDER — HYDROMORPHONE HCL 2 MG PO TABS
2.0000 mg | ORAL_TABLET | Freq: Once | ORAL | Status: AC
  Administered 2010-10-31: 2 mg via ORAL
  Filled 2010-10-31: qty 1

## 2010-10-31 MED ORDER — HYDROMORPHONE HCL 1 MG/ML IJ SOLN
2.0000 mg | Freq: Once | INTRAMUSCULAR | Status: AC
  Administered 2010-10-31: 2 mg via INTRAVENOUS
  Filled 2010-10-31: qty 2

## 2010-10-31 MED ORDER — HYDROMORPHONE HCL 4 MG PO TABS: 4.0000 mg | ORAL_TABLET | ORAL | Status: AC | PRN

## 2010-10-31 MED ORDER — POLYETHYLENE GLYCOL 3350 17 GM/SCOOP PO POWD: 17.0000 g | Freq: Every day | ORAL | Status: AC

## 2010-10-31 MED ORDER — DOCUSATE SODIUM 100 MG PO CAPS: 100.0000 mg | ORAL_CAPSULE | Freq: Two times a day (BID) | ORAL | Status: AC

## 2010-10-31 MED ORDER — HYDROMORPHONE HCL 2 MG/ML IJ SOLN: 2.0000 mg | Freq: Once | INTRAMUSCULAR | Status: DC

## 2010-10-31 NOTE — Progress Notes (Signed)
Beth Wallace, CNM at bedside.  [name] results discussed with pt.  poc discussed.

## 2010-10-31 NOTE — Progress Notes (Signed)
V. Smith, CNM at bedside.  Assessment done and poc discussed with pt.  

## 2010-10-31 NOTE — Progress Notes (Signed)
SSE done per CNM.

## 2010-10-31 NOTE — ED Provider Notes (Signed)
History   The patient is a 45 y.o. year old G39P0 female 2 1/2 weeks S/P hysterectomy who was transferred to [facility]U from [facility] for increasing RLQ pain and bleeding. CT w/out contrast (due to pt allergy to Contrast) showed ~5cm fluid collection above bladder and normal appendix. Radiology suggested [name]. Consulted w/ Dr. Claiborne Billings. Order Pelvic US.  Pt also reports significant constipation since surgery, although she had a BM this morning.   CSN: [id number] Arrival date & time: 10/30/2010 10:45 PM  Chief Complaint  Patient presents with  . Abdominal Pain    (Consider location/radiation/quality/duration/timing/severity/associated sxs/prior treatment) HPI  Past Medical History  Diagnosis Date  . Hypertension in pregnancy     on meds 1990  . GERD (gastroesophageal reflux disease)   . Anxiety     Past Surgical History  Procedure Date  . Cholecystectomy 1989  . Cesarean section   . Wisdom tooth extraction 2010  . Abdominal hysterectomy 09/2010    partial    Family History  Problem Relation Age of Onset  . Heart disease Mother   . Hypertension Father   . Diabetes Father     History  Substance Use Topics  . Smoking status: Current Everyday Smoker -- 0.2 packs/day    Types: Cigarettes  . Smokeless tobacco: Not on file  . Alcohol Use: No    OB History    Grav Para Term Preterm Abortions TAB SAB Ect Mult Living   2         0      Review of Systems: Otherwise neg  Allergies  Bee venom; Contrast media; and Iohexol  Home Medications  No current outpatient prescriptions on file.  BP 133/76  Pulse 64  Temp(Src) 98.1 F (36.7 C) (Oral)  Resp 18  Ht 5\' 7"  (1.702 m)  Physical Exam:  General: Mod distress, A&O x 4 Abd: Soft, NT, +BS x 4, Trocar incisions healing well Pelvic: small to mod amount of serosanguinous fluid in vault, normal odor. Cuff intact, no active bleeding.    ED Course  Procedures (including critical care time)  Labs Reviewed - No data  to display Ct Abdomen Pelvis Wo Contrast  10/30/2010  *RADIOLOGY REPORT*  Clinical Data: Status post left.  Hysterectomy.  Right-sided abdominal pain.  CT ABDOMEN AND PELVIS WITHOUT CONTRAST  Technique:  Multidetector CT imaging of the abdomen and pelvis was performed following the standard protocol without intravenous contrast.  Comparison: None.  Findings: Minimal dependent atelectasis in the lung bases.  No effusions.  Heart is normal size.  Prior cholecystectomy.  Liver, spleen, pancreas, adrenals and kidneys are unremarkable.  There is a normal retrocecal appendix. Large and small bowel grossly unremarkable.  There is a fluid collection near the midline of the pelvis just superior to the bladder.  This measures 5.6 x 3.8 cm.  Given the recent hysterectomy, postoperative seroma, hematoma, or abscess are possible.  This also could be an adnexal cyst.  Differentiation is difficult without IV contrast.  Pelvic ultrasound may be beneficial for further evaluation.  Urinary bladder is unremarkable.  IMPRESSION: 5.6 cm fluid collection in the midline of the pelvis just superior to the bladder with differential considerations as above. Characterization without IV contrast is difficult.  This can be further evaluated with pelvic ultrasound if felt clinically indicated.  Normal retrocecal appendix.  Original Report Authenticated By: Cyndie Chime, M.D.   Ct Abdomen Pelvis Wo Contrast  10/30/2010  *RADIOLOGY REPORT*  Clinical Data: Status post left.  Hysterectomy.  Right-sided abdominal pain.  CT ABDOMEN AND PELVIS WITHOUT CONTRAST  Technique:  Multidetector CT imaging of the abdomen and pelvis was performed following the standard protocol without intravenous contrast.  Comparison: None.  Findings: Minimal dependent atelectasis in the lung bases.  No effusions.  Heart is normal size.  Prior cholecystectomy.  Liver, spleen, pancreas, adrenals and kidneys are unremarkable.  There is a normal retrocecal appendix.  Large and small bowel grossly unremarkable.  There is a fluid collection near the midline of the pelvis just superior to the bladder.  This measures 5.6 x 3.8 cm.  Given the recent hysterectomy, postoperative seroma, hematoma, or abscess are possible.  This also could be an adnexal cyst.  Differentiation is difficult without IV contrast.  Pelvic ultrasound may be beneficial for further evaluation.  Urinary bladder is unremarkable.  IMPRESSION: 5.6 cm fluid collection in the midline of the pelvis just superior to the bladder with differential considerations as above. Characterization without IV contrast is difficult.  This can be further evaluated with pelvic ultrasound if felt clinically indicated.  Normal retrocecal appendix.  Original Report Authenticated By: Cyndie Chime, M.D.   US Transvaginal Non-ob  10/31/2010  *RADIOLOGY REPORT*  Clinical Data: Increased pain and vaginal bleeding two and one half weeks from hysterectomy.  TRANSABDOMINAL AND TRANSVAGINAL ULTRASOUND OF PELVIS Technique:  Both transabdominal and transvaginal ultrasound examinations of the pelvis were performed. Transabdominal technique was performed for global imaging of the pelvis including uterus, ovaries, adnexal regions, and pelvic cul-de-sac.  Comparison: CT 10/30/2010   It was necessary to proceed with endovaginal exam following the transabdominal exam to visualize the ovaries and pelvic fluid collection.  Findings:  Uterus: Surgically absent.  Endometrium: Surgically absent.  Right ovary:  The right ovary is not visualized.  No adnexal masses seen in the right side.  Left ovary: The left ovary measures about 6.5 x 4.6 x 5.5 cm. There is a cyst measuring about 5.3 x 4.1 x 4.4 cm, likely representing an ovarian  cyst. There is mild dependent echogenic material within this is consistent with hemorrhagic cyst. This probably accounts for the low attenuation structure seen on the previous CT scan.  Other findings: Slightly to the right of  midline, there is a 4.2 x 4.7 x 2.9 cm complex solid appearing structure with mixed hyper and hypoechoic appearance.  It is noted that there was no peristalsis seen.  This could represent non peristalsing bowel or colon or could represent a focal hematoma or postoperative fluid collection. A right ovarian hemorrhagic cyst is not excluded.  IMPRESSION: Surgical absence of the uterus.  Mild complex cyst in the left ovary probably representing a minimally hemorrhagic cyst.  Right ovary is not visualized.  To right of midline, there is a nonspecific mixed echotexture structure which might represent non peristalsing bowel, hematoma, postoperative fluid collection, or hemorrhagic right ovarian cyst.  Original Report Authenticated By: Marlon Pel, M.D.   US Pelvis Complete  10/31/2010  *RADIOLOGY REPORT*  Clinical Data: Increased pain and vaginal bleeding two and one half weeks from hysterectomy.  TRANSABDOMINAL AND TRANSVAGINAL ULTRASOUND OF PELVIS Technique:  Both transabdominal and transvaginal ultrasound examinations of the pelvis were performed. Transabdominal technique was performed for global imaging of the pelvis including uterus, ovaries, adnexal regions, and pelvic cul-de-sac.  Comparison: CT 10/30/2010   It was necessary to proceed with endovaginal exam following the transabdominal exam to visualize the ovaries and pelvic fluid collection.  Findings:  Uterus: Surgically absent.  Endometrium:  Surgically absent.  Right ovary:  The right ovary is not visualized.  No adnexal masses seen in the right side.  Left ovary: The left ovary measures about 6.5 x 4.6 x 5.5 cm. There is a cyst measuring about 5.3 x 4.1 x 4.4 cm, likely representing an ovarian  cyst. There is mild dependent echogenic material within this is consistent with hemorrhagic cyst. This probably accounts for the low attenuation structure seen on the previous CT scan.  Other findings: Slightly to the right of midline, there is a 4.2 x 4.7 x  2.9 cm complex solid appearing structure with mixed hyper and hypoechoic appearance.  It is noted that there was no peristalsis seen.  This could represent non peristalsing bowel or colon or could represent a focal hematoma or postoperative fluid collection. A right ovarian hemorrhagic cyst is not excluded.  IMPRESSION: Surgical absence of the uterus.  Mild complex cyst in the left ovary probably representing a minimally hemorrhagic cyst.  Right ovary is not visualized.  To right of midline, there is a nonspecific mixed echotexture structure which might represent non peristalsing bowel, hematoma, postoperative fluid collection, or hemorrhagic right ovarian cyst.  Original Report Authenticated By: Marlon Pel, M.D.   Pt responded well to IV Dilaudid.  MDM  Assessment: 1. Post-op fluid collection in adb not C/W hemorrhage or infection, 2. Left hemorrhagic ovarian cyst 4x6 cm 3. Worsening pain inadequately controlled w/ Percocet. Well-controlled w/ Dilaudid 4. Constipation  Plan: 1. Offered 23 hour Obs for pain management vs D/C home w/ Dilaudid Rx per consult w/ Dr, Claiborne Billings. Pt prefers D/C. 2. Rx Dilaudid 4 mg PO Q 4-6 PRN #30, no RF 3. F/U 11/03/10 w/ Henderson Cloud or PRN 4. Bleeding, infection precautions 5. Colace BID or Mira lax QD

## 2010-11-01 NOTE — ED Provider Notes (Signed)
Pt presented with pelvic pain to ER and was transferred to [facility] for further evaluation after CT performed showed 5cm colllection in pelvis after robotic hysterectomy performed 9/27.  Pt had stable vitals, nl Hb and WBC.  She had no evidence of active bleeding per vagina per MAU staff.  Pt had been evaluated by [name] in the office for pain the week prior and a 5cm simple cyst was found on the R ovary.  Repeat U/S in MAU showed collected 4x4x2cm c/w ruptured cyst.  Pt was monitored and was found to have pain controlled with po Dilaudid.  She was then discharged with instructions to f/u at scheduled office appt.9/19.

## 2011-01-02 ENCOUNTER — Telehealth: Payer: Self-pay | Admitting: *Deleted

## 2011-01-02 MED ORDER — LISINOPRIL-HYDROCHLOROTHIAZIDE 20-12.5 MG PO TABS: 1.0000 | ORAL_TABLET | Freq: Every day | ORAL | Status: DC

## 2011-01-02 NOTE — Telephone Encounter (Signed)
Refill on lisinopril

## 2011-01-03 ENCOUNTER — Telehealth: Payer: Self-pay | Admitting: *Deleted

## 2011-01-03 NOTE — Telephone Encounter (Signed)
error 

## 2011-03-06 ENCOUNTER — Other Ambulatory Visit (INDEPENDENT_AMBULATORY_CARE_PROVIDER_SITE_OTHER): Payer: 59

## 2011-03-06 DIAGNOSIS — Z0000 Encounter for general adult medical examination without abnormal findings: Secondary | ICD-10-CM

## 2011-03-06 LAB — CBC WITH DIFFERENTIAL/PLATELET
Basophils Absolute: 0 10*3/uL (ref 0.0–0.1)
Eosinophils Absolute: 0.1 10*3/uL (ref 0.0–0.7)
HCT: 39.8 % (ref 36.0–46.0)
Hemoglobin: 13.4 g/dL (ref 12.0–15.0)
Lymphs Abs: 1.9 10*3/uL (ref 0.7–4.0)
MCHC: 33.5 g/dL (ref 30.0–36.0)
MCV: 89.5 fl (ref 78.0–100.0)
Monocytes Absolute: 0.3 10*3/uL (ref 0.1–1.0)
Neutro Abs: 2.7 10*3/uL (ref 1.4–7.7)
Platelets: 278 10*3/uL (ref 150.0–400.0)
RDW: 14.9 % — ABNORMAL HIGH (ref 11.5–14.6)

## 2011-03-06 LAB — HEPATIC FUNCTION PANEL
Albumin: 3.9 g/dL (ref 3.5–5.2)
Total Bilirubin: 0.3 mg/dL (ref 0.3–1.2)

## 2011-03-06 LAB — POCT URINALYSIS DIPSTICK
Ketones, UA: NEGATIVE
Spec Grav, UA: >=1.03
Urobilinogen, UA: 0.2

## 2011-03-06 LAB — LIPID PANEL
Cholesterol: 151 mg/dL (ref 0–200)
LDL Cholesterol: 96 mg/dL (ref 0–99)
Triglycerides: 85 mg/dL (ref 0.0–149.0)
VLDL: 17 mg/dL (ref 0.0–40.0)

## 2011-03-06 LAB — BASIC METABOLIC PANEL
BUN: 17 mg/dL (ref 6–23)
CO2: 25 mEq/L (ref 19–32)
Chloride: 104 mEq/L (ref 96–112)
GFR: 100.08 mL/min (ref >60.00)
Glucose, Bld: 90 mg/dL (ref 70–99)
Potassium: 4.2 mEq/L (ref 3.5–5.1)
Sodium: 138 mEq/L (ref 135–145)

## 2011-03-06 LAB — TSH: TSH: 0.93 u[IU]/mL (ref 0.35–5.50)

## 2011-03-11 DIAGNOSIS — M542 Cervicalgia: Secondary | ICD-10-CM | POA: Insufficient documentation

## 2011-03-11 DIAGNOSIS — Z79899 Other long term (current) drug therapy: Secondary | ICD-10-CM | POA: Insufficient documentation

## 2011-03-11 DIAGNOSIS — R002 Palpitations: Secondary | ICD-10-CM | POA: Insufficient documentation

## 2011-03-11 DIAGNOSIS — R079 Chest pain, unspecified: Secondary | ICD-10-CM | POA: Insufficient documentation

## 2011-03-11 DIAGNOSIS — M25519 Pain in unspecified shoulder: Secondary | ICD-10-CM | POA: Insufficient documentation

## 2011-03-11 DIAGNOSIS — K219 Gastro-esophageal reflux disease without esophagitis: Secondary | ICD-10-CM | POA: Insufficient documentation

## 2011-03-12 ENCOUNTER — Other Ambulatory Visit: Payer: Self-pay

## 2011-03-12 ENCOUNTER — Encounter (HOSPITAL_COMMUNITY): Payer: Self-pay

## 2011-03-12 ENCOUNTER — Emergency Department (HOSPITAL_COMMUNITY)
Admission: EM | Admit: 2011-03-12 | Discharge: 2011-03-12 | Disposition: A | Discharge status: Home / Self Care | Payer: 59 | Attending: Emergency Medicine | Admitting: Emergency Medicine

## 2011-03-12 ENCOUNTER — Emergency Department (HOSPITAL_COMMUNITY): Payer: 59

## 2011-03-12 LAB — BASIC METABOLIC PANEL
BUN: 16 mg/dL (ref 6–23)
Calcium: 9.3 mg/dL (ref 8.4–10.5)
Creatinine, Ser: 0.73 mg/dL (ref 0.50–1.10)
GFR calc Af Amer: >90 mL/min (ref >90)
GFR calc non Af Amer: >90 mL/min (ref >90)
Potassium: 3.8 mEq/L (ref 3.5–5.1)

## 2011-03-12 LAB — CBC
HCT: 38.1 % (ref 36.0–46.0)
MCH: 29.8 pg (ref 26.0–34.0)
MCHC: 34.1 g/dL (ref 30.0–36.0)
RDW: 14.5 % (ref 11.5–15.5)

## 2011-03-12 LAB — CARDIAC PANEL(CRET KIN+CKTOT+MB+TROPI): Troponin I: <0.3 ng/mL (ref <0.30)

## 2011-03-12 MED ORDER — NITROGLYCERIN 0.4 MG SL SUBL
0.4000 mg | SUBLINGUAL_TABLET | Freq: Once | SUBLINGUAL | Status: AC
  Administered 2011-03-12: 0.4 mg via SUBLINGUAL
  Filled 2011-03-12: qty 25

## 2011-03-12 NOTE — ED Provider Notes (Signed)
History     CSN: [id number]  Arrival date & time 03/11/11  2342   First MD Initiated Contact with Patient 03/12/11 0020      Chief Complaint  Patient presents with  . Chest Pain    (Consider location/radiation/quality/duration/timing/severity/associated sxs/prior treatment) HPI Comments: 46 year old female with a history of hypertension who presents approximately 3 weeks after starting prednisone, albuterol and Zithromax for an upper respiratory infection. She states that she had gradual onset of intermittent palpitations, chest heaviness and left neck and shoulder pain approximately one week ago. She states this only happens at night, is not associated with exertion, position, eating. She denies any cough or shortness of breath and has had no nausea vomiting diarrhea abdominal pain back pain or diaphoresis. Currently her symptoms are very mild and consist only of a mild chest heaviness. She has not had the above-mentioned medications in over 2 weeks  Patient is a 46 y.o. female presenting with chest pain. The history is provided by the patient and a relative.  Chest Pain     Past Medical History  Diagnosis Date  . Hypertension in pregnancy     on meds 1990  . GERD (gastroesophageal reflux disease)   . Anxiety     Past Surgical History  Procedure Date  . Cholecystectomy 1989  . Cesarean section   . Wisdom tooth extraction 2010  . Abdominal hysterectomy 09/2010    partial    Family History  Problem Relation Age of Onset  . Heart disease Mother   . Hypertension Father   . Diabetes Father     History  Substance Use Topics  . Smoking status: Current Everyday Smoker -- 0.2 packs/day    Types: Cigarettes  . Smokeless tobacco: Not on file  . Alcohol Use: No    OB History    Grav Para Term Preterm Abortions TAB SAB Ect Mult Living   2         0      Review of Systems  Cardiovascular: Positive for chest pain.  All other systems reviewed and are  negative.    Allergies  Bee venom; Contrast media; and Iohexol  Home Medications   Current Outpatient Rx  Name Route Sig Dispense Refill  . LISINOPRIL-HYDROCHLOROTHIAZIDE 20-12.5 MG PO TABS Oral Take 1 tablet by mouth daily. 90 tablet 1  . CEFUROXIME AXETIL 500 MG PO TABS Oral Take 500 mg by mouth 2 (two) times daily. For 7 days     . CYCLOBENZAPRINE HCL 10 MG PO TABS Oral Take 5 mg by mouth daily as needed. Muscle spasms    . IBUPROFEN 800 MG PO TABS Oral Take 800 mg by mouth every 8 (eight) hours as needed. For cramps     . OMEPRAZOLE 20 MG PO CPDR Oral Take 20 mg by mouth daily as needed. For heartburn       BP 122/76  Temp(Src) 98.1 F (36.7 C) (Oral)  Resp 16  Ht 5' 7.5" (1.715 m)  Wt 258 lb (117.028 kg)  BMI 39.81 kg/m2  SpO2 97%  Physical Exam  Nursing note and vitals reviewed. Constitutional: She appears well-developed and well-nourished. No distress.  HENT:  Head: Normocephalic and atraumatic.  Mouth/Throat: Oropharynx is clear and moist. No oropharyngeal exudate.  Eyes: Conjunctivae and EOM are normal. Pupils are equal, round, and reactive to light. Right eye exhibits no discharge. Left eye exhibits no discharge. No scleral icterus.  Neck: Normal range of motion. Neck supple. No JVD present. No thyromegaly  present.  Cardiovascular: Normal rate, regular rhythm, normal heart sounds and intact distal pulses.  Exam reveals no gallop and no friction rub.   No murmur heard. Pulmonary/Chest: Effort normal and breath sounds normal. No respiratory distress. She has no wheezes. She has no rales.  Abdominal: Soft. Bowel sounds are normal. She exhibits no distension and no mass. There is no tenderness.  Musculoskeletal: Normal range of motion. She exhibits no edema and no tenderness.  Lymphadenopathy:    She has no cervical adenopathy.  Neurological: She is alert. Coordination normal.  Skin: Skin is warm and dry. No rash noted. No erythema.  Psychiatric: She has a normal  mood and affect. Her behavior is normal.    ED Course  Procedures (including critical care time)  ED ECG REPORT   Date: 03/12/2011   Rate: 62  Rhythm: normal sinus rhythm  QRS Axis: left  Intervals: normal  ST/T Wave abnormalities: normal  Conduction Disutrbances:none  Narrative Interpretation: LVH  Old EKG Reviewed: unchanged and From 10/04/2010   Labs Reviewed  BASIC METABOLIC PANEL - Abnormal; Notable for the following:    Glucose, Bld 102 (*)    All other components within normal limits  CARDIAC PANEL(CRET KIN+CKTOT+MB+TROPI) - Abnormal; Notable for the following:    Total CK 283 (*)    All other components within normal limits  CBC   Dg Chest 2 View  03/12/2011  *RADIOLOGY REPORT*  Clinical Data: Chest pain.  CHEST - 2 VIEW  Comparison: None.  Findings: Two views of the chest demonstrate clear lungs. Heart and mediastinum are within normal limits.  The trachea is midline. Bony structures intact.  No evidence for a pneumothorax.  IMPRESSION: No acute chest findings.  Original Report Authenticated By: Richarda Overlie, M.D.     1. Chest pain       MDM  Patient has a normal exam, EKG shows normal sinus rhythm with borderline left ventricular hypertrophy criteria, left axis deviation. Compared with prior EKG there is no changes from September 2012.  Proceed with chest x-ray, lab work. Pain does not appear to be consistent with acute coronary syndrome. The patient does have a family history of heart disease thus we will check a troponin and other lab work.  ED Course:  Patient had complete relief of pain while she was here, she had recurrent palpitations when I told her her results - but no tachycardia associated with these palpitations. They have since resolved. Again she is not relate any exertional symptoms in her symptoms are strictly at nighttime. At this time I believe that her symptoms are not related to cardiac disease, she has a nonischemic EKG and normal laboratory values.  I have encouraged her to follow up with her family doctor, she is seeing them in the next 36 hours and she will have them set her up for a Holter monitor and a stress test. She is aware of the symptoms and what she should return to the emergency department.  No palpitations or arrhythmias have been witnessed while she has been here   Health and safety inspector results reviewed:  CBC, basic metabolic panel, troponin normal   Radiology imaging reviewed:  Chest x-ray reviewed by myself and radiologist, no acute findings   Previous Records reviewed: Noncontributory   Medications given in ED: Nitroglycerin  The pt and (or) family members were informed of results and need for close follow up  Consultation: None  Disposition:  Home  Vida Roller, MD 03/12/11 [phone]

## 2011-03-12 NOTE — Discharge Instructions (Signed)
You're tests in the emergency Department are normal showing no signs of heart disease. I would encourage you to followup with your family doctor and request to have 2 different tests,  #1 cardiac Holter monitor test to evaluate for palpitations - we have not seen any abnormal rhythms while you're in the emergency department  #2 - stress test - it will be important to further evaluate if you have any blockages in her heart.  If you should develop severe or worsening symptoms including chest pain, shortness of breath or nausea vomiting weakness, return to the emergency department immediately for further testing.

## 2011-03-12 NOTE — ED Notes (Signed)
Chest tightness, some shortness of breath, intermittent wandering pain from chest to arm to neck per pt. Started about a week ago per pt.  Was on prednisone for an URI per pt. Along with a z-pack for the same per pt.

## 2011-03-12 NOTE — ED Notes (Signed)
Patient states pain is unchanged since nitro sl.

## 2011-03-13 ENCOUNTER — Ambulatory Visit (INDEPENDENT_AMBULATORY_CARE_PROVIDER_SITE_OTHER): Payer: 59 | Admitting: Internal Medicine

## 2011-03-13 ENCOUNTER — Encounter: Payer: Self-pay | Admitting: Internal Medicine

## 2011-03-13 VITALS — BP 110/80 | HR 72 | Ht 67.5 in | Wt 258.0 lb

## 2011-03-13 DIAGNOSIS — R12 Heartburn: Secondary | ICD-10-CM

## 2011-03-13 DIAGNOSIS — R002 Palpitations: Secondary | ICD-10-CM

## 2011-03-13 DIAGNOSIS — F17200 Nicotine dependence, unspecified, uncomplicated: Secondary | ICD-10-CM

## 2011-03-13 DIAGNOSIS — R0789 Other chest pain: Secondary | ICD-10-CM | POA: Insufficient documentation

## 2011-03-13 DIAGNOSIS — R8290 Unspecified abnormal findings in urine: Secondary | ICD-10-CM | POA: Insufficient documentation

## 2011-03-13 DIAGNOSIS — R82998 Other abnormal findings in urine: Secondary | ICD-10-CM

## 2011-03-13 DIAGNOSIS — Z0000 Encounter for general adult medical examination without abnormal findings: Secondary | ICD-10-CM

## 2011-03-13 DIAGNOSIS — I10 Essential (primary) hypertension: Secondary | ICD-10-CM

## 2011-03-13 DIAGNOSIS — E669 Obesity, unspecified: Secondary | ICD-10-CM

## 2011-03-13 DIAGNOSIS — Z8249 Family history of ischemic heart disease and other diseases of the circulatory system: Secondary | ICD-10-CM

## 2011-03-13 DIAGNOSIS — F4323 Adjustment disorder with mixed anxiety and depressed mood: Secondary | ICD-10-CM

## 2011-03-13 MED ORDER — IBUPROFEN 800 MG PO TABS: 800.0000 mg | ORAL_TABLET | Freq: Three times a day (TID) | ORAL | Status: DC | PRN

## 2011-03-13 MED ORDER — LORAZEPAM 0.5 MG PO TABS: 0.5000 mg | ORAL_TABLET | Freq: Two times a day (BID) | ORAL | Status: DC | PRN

## 2011-03-13 MED ORDER — LISINOPRIL-HYDROCHLOROTHIAZIDE 20-12.5 MG PO TABS: 1.0000 | ORAL_TABLET | Freq: Every day | ORAL | Status: DC

## 2011-03-13 MED ORDER — CYCLOBENZAPRINE HCL 10 MG PO TABS: 5.0000 mg | ORAL_TABLET | Freq: Every day | ORAL | Status: DC | PRN

## 2011-03-13 MED ORDER — LORAZEPAM 0.5 MG PO TABS: 0.5000 mg | ORAL_TABLET | Freq: Two times a day (BID) | ORAL | Status: AC | PRN

## 2011-03-13 MED ORDER — OMEPRAZOLE 20 MG PO CPDR: 20.0000 mg | DELAYED_RELEASE_CAPSULE | Freq: Every day | ORAL | Status: DC | PRN

## 2011-03-13 NOTE — Progress Notes (Signed)
Subjective:    Patient ID: Beth Wallace, female    DOB: [birth year], 46 y.o.   MRN: [id number]  HPI Patient comes in today for preventive visit and follow-up of medical issues. Update  history since  last visit: Seen in the emergency room 224 for chest pain that she describes as rapid heart eat and feeling funny. She had just been treated with prednisone inhalers and azithromycin for respiratory infection but was off of medication for a couple weeks. The emergency room physician did EKG and enzymes chest x-ray recommended outpatient Holter and stress test because of her risk factors and the fact that EKG showed some LAD.  Since she has been out of the emergency room she hasn't had a repeat spell.  Wonders if that could be anxiety but she does have cardiac risk factors still smoking a small amount/ gets intermittent heartburn. Currently she has no active chest pain shortness of breath or cough. No unusual numbness but does have intermittent pinched nerve symptoms she says in her left arm Some pms? Has no menses cause of  hysterectomy.   Review of Systems Negative for fever weight loss has contacts no change in hearing sleep is good no falling bleeding bruising. Gets occasional back pain and needs medication for spasm uses ibuprofen a couple times a month at the most gets heartburn premenstrually takes Prilosec as needed. No UTI symptoms but Dr. Henderson Cloud noted blood in her urine and negative on repeat.  Past history family history social history reviewed in the electronic medical record. Coming up on the fifth anniversary of her daughter's murder.   Outpatient Prescriptions Prior to Visit  Medication Sig Dispense Refill  . cyclobenzaprine (FLEXERIL) 10 MG tablet Take 5 mg by mouth daily as needed. Muscle spasms back      . ibuprofen (ADVIL,MOTRIN) 800 MG tablet Take 800 mg by mouth every 8 (eight) hours as needed. For cramps   and back    . lisinopril-hydrochlorothiazide (PRINZIDE,ZESTORETIC)  20-12.5 MG per tablet Take 1 tablet by mouth daily.  90 tablet  1  . omeprazole (PRILOSEC) 20 MG capsule Take 20 mg by mouth daily as needed. For heartburn       .            Objective:   Physical Exam Physical Exam: Vital signs reviewed BJY:NWGN is a well-developed well-nourished alert cooperative  AA female who appears her stated age in no acute distress.  HEENT: normocephalic atraumatic , Eyes: PERRL EOM's full, conjunctiva clear, Nares: paten,t no deformity discharge or tenderness., Ears: no deformity EAC's clear TMs with normal landmarks. Mouth: clear OP, no lesions, edema.  Moist mucous membranes. Dentition in adequate repair. NECK: supple without masses, thyromegaly or bruits. CHEST/PULM:  Clear to auscultation and percussion breath sounds equal no wheeze , rales or rhonchi. No chest wall deformities or tenderness. CV: PMI is nondisplaced, S1 S2 no gallops, murmurs, rubs. Peripheral pulses are full without delay.No JVD .  Breast: normal by inspection . No dimpling, discharge, masses, tenderness or discharge .  ABDOMEN: Bowel sounds normal nontender  No guard or rebound, no hepato splenomegal no CVA tenderness.  No hernia. Extremtities:  No clubbing cyanosis or edema, no acute joint swelling or redness no focal atrophy NEURO:  Oriented x3, cranial nerves 3-12 appear to be intact, no obvious focal weakness,gait within normal limits no abnormal reflexes or asymmetrical SKIN: No acute rashes normal turgor, color, no bruising or petechiae. PSYCH: Oriented, good eye contact, no obvious depression anxiety, cognition and  judgment appear normal. LN: no cervical axillary inguinal adenopathy  See  ED notes  Lab Results  Component Value Date   WBC 6.9 03/12/2011   HGB 13.0 03/12/2011   HCT 38.1 03/12/2011   PLT 305 03/12/2011   GLUCOSE 102*  Non fasting in ed  03/12/2011   CHOL 151 03/06/2011   TRIG 85.0 03/06/2011   HDL 37.80* 03/06/2011   LDLCALC 96 03/06/2011   ALT 14 03/06/2011   AST 16  03/06/2011   NA 138 03/12/2011   K 3.8 03/12/2011   CL 102 03/12/2011   CREATININE 0.73 03/12/2011   BUN 16 03/12/2011   CO2 28 03/12/2011   TSH 0.93 03/06/2011      UA 2 + blood by dipstick     Assessment & Plan:  Preventive Health Care Counseled regarding healthy nutrition, exercise, sleep, injury prevention, calcium vit d and healthy weight .  Hypertension stable on current medication Heartburn reflux symptoms using medications as needed mostly related to hormonal cycle do not really think this is cardiac Intermittent back spasm discussed medication use Heart racing  Episode with  possible atypical chest pain.  Status post ED visit has cardiac risk factors this could be anxiety medicine side effect et Karie Soda however I feel that she should have a cardiovascular risk assessment performed. Recommend echocardiogram and cardiology consult as to advisability of further testing such as event monitor or stress test  Arm "pinched nerve". By history no atrophy or abnormality on exam today Reactive anxiety rarely uses medication discussed use of medicine refilled today. Risk benefit discussed.  Tobacco use cutting down recommend stop  Abnormal UA  2+ blood may be insignificant Family hx heart disease

## 2011-03-13 NOTE — Patient Instructions (Addendum)
Will   Plan echocardiogram and cardiology consult about the sx we discussed.  You will be contacted  about this.   Take   prilosec  On the days you take ibuprofen .  Calendar year symptoms some of them can be premenstrual. Continue your blood pressure medication. I will look at her record and decide if the chemical test for blood in the urine is important. Sometimes it is not. Can use Ativan as needed remembering it is a dependent producing medication and can affect mental clarity.  Please stop smoking it is not good for your health.  Continue to get her to sleep. Plan on followup visit in 6 months unless  needed before then.

## 2011-03-22 ENCOUNTER — Other Ambulatory Visit: Payer: Self-pay

## 2011-03-22 ENCOUNTER — Other Ambulatory Visit (INDEPENDENT_AMBULATORY_CARE_PROVIDER_SITE_OTHER): Payer: 59

## 2011-03-22 ENCOUNTER — Ambulatory Visit (HOSPITAL_COMMUNITY): Payer: 59 | Attending: Cardiology

## 2011-03-22 DIAGNOSIS — R002 Palpitations: Secondary | ICD-10-CM | POA: Insufficient documentation

## 2011-03-22 DIAGNOSIS — I10 Essential (primary) hypertension: Secondary | ICD-10-CM | POA: Insufficient documentation

## 2011-03-22 DIAGNOSIS — R82998 Other abnormal findings in urine: Secondary | ICD-10-CM

## 2011-03-22 DIAGNOSIS — E669 Obesity, unspecified: Secondary | ICD-10-CM | POA: Insufficient documentation

## 2011-03-22 DIAGNOSIS — R0789 Other chest pain: Secondary | ICD-10-CM

## 2011-03-22 DIAGNOSIS — R8290 Unspecified abnormal findings in urine: Secondary | ICD-10-CM

## 2011-03-22 LAB — URINALYSIS, ROUTINE W REFLEX MICROSCOPIC
Bilirubin Urine: NEGATIVE
Ketones, ur: NEGATIVE
Leukocytes, UA: NEGATIVE
Nitrite: NEGATIVE
Specific Gravity, Urine: 1.025 (ref 1.000–1.030)
Total Protein, Urine: NEGATIVE
Urine Glucose: NEGATIVE
Urobilinogen, UA: 0.2 (ref 0.0–1.0)
pH: 6 (ref 5.0–8.0)

## 2011-03-28 ENCOUNTER — Other Ambulatory Visit: Payer: Self-pay | Admitting: Internal Medicine

## 2011-03-28 DIAGNOSIS — R8290 Unspecified abnormal findings in urine: Secondary | ICD-10-CM

## 2011-03-28 NOTE — Progress Notes (Signed)
Quick Note:  Pt aware of results. ______ 

## 2011-04-06 ENCOUNTER — Ambulatory Visit (INDEPENDENT_AMBULATORY_CARE_PROVIDER_SITE_OTHER): Payer: 59 | Admitting: Cardiology

## 2011-04-06 ENCOUNTER — Encounter: Payer: Self-pay | Admitting: Cardiology

## 2011-04-06 VITALS — BP 116/72 | HR 84 | Wt 275.1 lb

## 2011-04-06 DIAGNOSIS — I10 Essential (primary) hypertension: Secondary | ICD-10-CM

## 2011-04-06 DIAGNOSIS — F17200 Nicotine dependence, unspecified, uncomplicated: Secondary | ICD-10-CM

## 2011-04-06 DIAGNOSIS — E669 Obesity, unspecified: Secondary | ICD-10-CM

## 2011-04-06 DIAGNOSIS — R002 Palpitations: Secondary | ICD-10-CM

## 2011-04-06 DIAGNOSIS — R0789 Other chest pain: Secondary | ICD-10-CM

## 2011-04-06 NOTE — Assessment & Plan Note (Signed)
I applaud her tobacco cessation.

## 2011-04-06 NOTE — Patient Instructions (Signed)
The current medical regimen is effective;  continue present plan and medications.  Your physician has requested that you have an exercise tolerance test. For further information please visit [email or web address]. Please also follow instruction sheet, as given.   

## 2011-04-06 NOTE — Assessment & Plan Note (Signed)
The blood pressure is at target. No change in medications is indicated. We will continue with therapeutic lifestyle changes (TLC).  

## 2011-04-06 NOTE — Progress Notes (Signed)
HPI The patient presents for dilation of palpitations and shortness of breath. She also has had some mild chest discomfort. She said this started when possible cold like symptoms in January. Treated with antibiotics and subsequently with steroids. She eventually had some dyspnea and vague chest discomfort with some rapid heart rates while she was taking steroids. He did present to the [facility] ER.  I reviewed these records. There was no objective evidence of ischemia. It was suggested that she might need an outpatient stress test or monitor. Since that time the patient has had no new symptoms. She actually has been pedaling a bicycle recently. She stopped smoking. She has gained about 9 pounds. She says with her activities she's not having any chest pressure, neck or arm discomfort. She's not having any new dyspnea, PND or orthopnea. She has no palpitations, presyncope or syncope. She has had no weight gain or edema.  Allergies  Allergen Reactions  . Bee Venom Shortness Of Breath  . Contrast Media (Iodinated Diagnostic Agents) Anaphylaxis    CT dye ivp Throat swelling  . Iohexol Anaphylaxis    THROAT SWELLS CLOSED    Current Outpatient Prescriptions  Medication Sig Dispense Refill  . cyclobenzaprine (FLEXERIL) 10 MG tablet Take 0.5 tablets (5 mg total) by mouth daily as needed. Muscle spasms  30 tablet  2  . ibuprofen (ADVIL,MOTRIN) 800 MG tablet Take 1 tablet (800 mg total) by mouth every 8 (eight) hours as needed. For cramps  30 tablet  2  . lisinopril-hydrochlorothiazide (PRINZIDE,ZESTORETIC) 20-12.5 MG per tablet Take 1 tablet by mouth daily.  90 tablet  3  . omeprazole (PRILOSEC) 20 MG capsule Take 1 capsule (20 mg total) by mouth daily as needed. For heartburn  30 capsule  11    Past Medical History  Diagnosis Date  . HTN (hypertension)   . GERD (gastroesophageal reflux disease)   . Anxiety     Past Surgical History  Procedure Date  . Cholecystectomy 1989  . Cesarean section   .  Wisdom tooth extraction 2010  . Abdominal hysterectomy 09/2010    partial    Family History  Problem Relation Age of Onset  . Heart disease Mother 51    Died age 47, MI  . Hypertension Father   . Diabetes Father     History   Social History  . Marital Status: Single    Spouse Name: N/A    Number of Children: N/A  . Years of Education: N/A   Occupational History  . Not on file.   Social History Main Topics  . Smoking status: Former Smoker -- 0.2 packs/day    Types: Cigarettes    Quit date: 03/16/2011  . Smokeless tobacco: Not on file  . Alcohol Use: No  . Drug Use: No  . Sexually Active: Not on file   Other Topics Concern  . 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 Otherwise as stated in the  history of present illness and negative for all other systems.  PHYSICAL EXAM BP 116/72  Pulse 84  Wt 275 lb 1.9 oz (124.794 kg) GENERAL:  Well appearing HEENT:  Pupils equal round and reactive, fundi not visualized, oral mucosa unremarkable NECK:  No jugular venous distention, waveform within normal limits, carotid upstroke brisk and symmetric, no bruits, no thyromegaly LYMPHATICS:  No cervical, inguinal adenopathy LUNGS:  Clear to auscultation bilaterally BACK:  No CVA tenderness CHEST:  Unremarkable HEART:  PMI not displaced or sustained,S1 and S2 within normal limits, no S3, no S4, no clicks, no rubs, no murmurs ABD:  Flat, positive bowel sounds normal in frequency in pitch, no bruits, no rebound, no guarding, no midline pulsatile mass, no hepatomegaly, no splenomegaly EXT:  2 plus pulses  throughout, no edema, no cyanosis no clubbing SKIN:  No rashes no nodules NEURO:  Cranial nerves II through XII grossly intact, motor grossly intact throughout PSYCH:  Cognitively intact, oriented to person place and time  EKG:  Sinus rhythm, rate 62, axis within normal limits, intervals within normal limits, no acute ST-T wave changes. 03/12/11  ASSESSMENT AND PLAN

## 2011-04-06 NOTE — Assessment & Plan Note (Signed)
Her chest discomfort is somewhat atypical. However, she does have a significant family history and other risk factors. Therefore, I think screening with stress testing is indicated. I will bring the patient back for a POET (Plain Old Exercise Test). This will allow me to screen for obstructive coronary disease, risk stratify and very importantly provide a prescription for exercise.

## 2011-04-06 NOTE — Assessment & Plan Note (Signed)
The patient understands the need to lose weight with diet and exercise. We have discussed specific strategies for this.  

## 2011-04-11 ENCOUNTER — Telehealth: Payer: Self-pay | Admitting: *Deleted

## 2011-04-11 NOTE — Telephone Encounter (Signed)
Everything she eats is stuck in her stomach. Hurting in her throat and her stomach. No sob. Heart is speeding up. No numbness or tingling down arms or legs. No burning. Bad taste in mouth. Per Dr.  Fabian Sharp- appt for tomorrow.

## 2011-04-12 ENCOUNTER — Ambulatory Visit (INDEPENDENT_AMBULATORY_CARE_PROVIDER_SITE_OTHER): Payer: 59 | Admitting: Internal Medicine

## 2011-04-12 ENCOUNTER — Telehealth: Payer: Self-pay | Admitting: Gastroenterology

## 2011-04-12 ENCOUNTER — Encounter: Payer: Self-pay | Admitting: Internal Medicine

## 2011-04-12 ENCOUNTER — Ambulatory Visit (INDEPENDENT_AMBULATORY_CARE_PROVIDER_SITE_OTHER): Payer: 59 | Admitting: Nurse Practitioner

## 2011-04-12 ENCOUNTER — Encounter: Payer: Self-pay | Admitting: Nurse Practitioner

## 2011-04-12 VITALS — BP 132/80 | HR 81 | Temp 98.4°F | Wt 277.0 lb

## 2011-04-12 VITALS — BP 124/70 | HR 76 | Ht 67.5 in | Wt 276.1 lb

## 2011-04-12 DIAGNOSIS — R1310 Dysphagia, unspecified: Secondary | ICD-10-CM

## 2011-04-12 DIAGNOSIS — R0789 Other chest pain: Secondary | ICD-10-CM

## 2011-04-12 DIAGNOSIS — R8290 Unspecified abnormal findings in urine: Secondary | ICD-10-CM

## 2011-04-12 DIAGNOSIS — R1010 Upper abdominal pain, unspecified: Secondary | ICD-10-CM

## 2011-04-12 DIAGNOSIS — K219 Gastro-esophageal reflux disease without esophagitis: Secondary | ICD-10-CM

## 2011-04-12 DIAGNOSIS — R109 Unspecified abdominal pain: Secondary | ICD-10-CM

## 2011-04-12 DIAGNOSIS — I10 Essential (primary) hypertension: Secondary | ICD-10-CM

## 2011-04-12 DIAGNOSIS — R82998 Other abnormal findings in urine: Secondary | ICD-10-CM

## 2011-04-12 LAB — POCT URINALYSIS DIPSTICK
Bilirubin, UA: NEGATIVE
Ketones, UA: NEGATIVE
Leukocytes, UA: NEGATIVE

## 2011-04-12 MED ORDER — DEXLANSOPRAZOLE 60 MG PO CPDR: 60.0000 mg | DELAYED_RELEASE_CAPSULE | Freq: Every day | ORAL | Status: DC

## 2011-04-12 NOTE — Patient Instructions (Signed)
We have scheduled the Endoscopy with Dr. Claudette Head. Date: 04-20-2011 @ 2:00 PM. Arrival time is 1:00 PM . Directions and brochure provided. Upper GI Endoscopy Upper GI endoscopy means using a flexible scope to look at the esophagus, stomach, and upper small bowel. This is done to make a diagnosis in people with heartburn, abdominal pain, or abnormal bleeding. Sometimes an endoscope is needed to remove foreign bodies or food that become stuck in the esophagus; it can also be used to take biopsy samples. For the best results, do not eat or drink for 8 hours before having your upper endoscopy.  To perform the endoscopy, you will probably be sedated and your throat will be numbed with a special spray. The endoscope is then slowly passed down your throat (this will not interfere with your breathing). An endoscopy exam takes 15 to 30 minutes to complete and there is no real pain. Patients rarely remember much about the procedure. The results of the test may take several days if a biopsy or other test is taken.  You may have a sore throat after an endoscopy exam. Serious complications are very rare. Stick to liquids and soft foods until your pain is better. Do not drive a car or operate any dangerous equipment for at least 24 hours after being sedated. SEEK IMMEDIATE MEDICAL CARE IF:   You have severe throat pain.   You have shortness of breath.   You have bleeding problems.   You have a fever.   You have difficulty recovering from your sedation.  Document Released: 02/09/2004 Document Revised: 12/21/2010 Document Reviewed: 01/04/2008 [facility] Patient Information 2012 [place], [place].

## 2011-04-12 NOTE — Patient Instructions (Addendum)
I think that this pain and discomfort is esophageal we will refer you to gastroenterologists concerned that you could have narrowing in your esophagus adding to your problems.  Weight loss does help the symptoms usually.  Congratulations  on stopping tobacco it is the best thing you can do for your health. However he may be going through some equivalent of a drug withdrawal and this should get better with time.  You can change to Dexilant  for now until you see the gastroenterologist.  You may want to tract her PMS symptoms  . Gastroesophageal Reflux Disease, Adult Gastroesophageal reflux disease (GERD) happens when acid from your stomach flows up into the esophagus. When acid comes in contact with the esophagus, the acid causes soreness (inflammation) in the esophagus. Over time, GERD may create small holes (ulcers) in the lining of the esophagus. CAUSES   Increased body weight. This puts pressure on the stomach, making acid rise from the stomach into the esophagus.   Smoking. This increases acid production in the stomach.   Drinking alcohol. This causes decreased pressure in the lower esophageal sphincter (valve or ring of muscle between the esophagus and stomach), allowing acid from the stomach into the esophagus.   Late evening meals and a full stomach. This increases pressure and acid production in the stomach.   A malformed lower esophageal sphincter.  Sometimes, no cause is found. SYMPTOMS   Burning pain in the lower part of the mid-chest behind the breastbone and in the mid-stomach area. This may occur twice a week or more often.   Trouble swallowing.   Sore throat.   Dry cough.   Asthma-like symptoms including chest tightness, shortness of breath, or wheezing.  DIAGNOSIS  Your caregiver may be able to diagnose GERD based on your symptoms. In some cases, X-rays and other tests may be done to check for complications or to check the condition of your stomach and  esophagus. TREATMENT  Your caregiver may recommend over-the-counter or prescription medicines to help decrease acid production. Ask your caregiver before starting or adding any new medicines.  HOME CARE INSTRUCTIONS   Change the factors that you can control. Ask your caregiver for guidance concerning weight loss, quitting smoking, and alcohol consumption.   Avoid foods and drinks that make your symptoms worse, such as:   Caffeine or alcoholic drinks.   Chocolate.   Peppermint or mint flavorings.   Garlic and onions.   Spicy foods.   Citrus fruits, such as oranges, lemons, or limes.   Tomato-based foods such as sauce, chili, salsa, and pizza.   Fried and fatty foods.   Avoid lying down for the 3 hours prior to your bedtime or prior to taking a nap.   Eat small, frequent meals instead of large meals.   Wear loose-fitting clothing. Do not wear anything tight around your waist that causes pressure on your stomach.   Raise the head of your bed 6 to 8 inches with wood blocks to help you sleep. Extra pillows will not help.   Only take over-the-counter or prescription medicines for pain, discomfort, or fever as directed by your caregiver.   Do not take aspirin, ibuprofen, or other nonsteroidal anti-inflammatory drugs (NSAIDs).  SEEK IMMEDIATE MEDICAL CARE IF:   You have pain in your arms, neck, jaw, teeth, or back.   Your pain increases or changes in intensity or duration.   You develop nausea, vomiting, or sweating (diaphoresis).   You develop shortness of breath, or you faint.  Your vomit is green, yellow, black, or looks like coffee grounds or blood.   Your stool is red, bloody, or black.  These symptoms could be signs of other problems, such as heart disease, gastric bleeding, or esophageal bleeding. MAKE SURE YOU:   Understand these instructions.   Will watch your condition.   Will get help right away if you are not doing well or get worse.  Document Released:  10/11/2004 Document Revised: 12/21/2010 Document Reviewed: 07/21/2010 [facility] Patient Information 2012 [place], [place].

## 2011-04-12 NOTE — Telephone Encounter (Signed)
Pt scheduled to see Willette Cluster NP today at 3:30pm. Aurther Loft to notify pt of appt date and time .

## 2011-04-12 NOTE — Progress Notes (Signed)
04/12/2011 Beth Wallace [id number] 01/27/1965   HISTORY OF PRESENT ILLNESS: Patient is a 46 year old female, new to this practice, here for evaluation of GERD. In 2008 patient developed problems with regurgitation. She was treated with daily Prilosec for a month but after losing several pounds patient was able to take the PPI only as needed. Last January 2013 patient began to feel funny in her chest. Initially thought to have a cold, patient was treated with a Z-Pak. Her symptoms didn't improve so she saw cardiology two weeks ago, echocardiogram was apparently normal. Patient restarted daily Prilosec in January but not helping. PCP started her on Dexilant today. After meals she feels like food is sitting her upper stomach and she refers to this as indigestion.She doesn't feel like food gets stuck in her esophagus. . No heartburn or regurgitation.  Frequent belching is a problem.   Past Medical History  Diagnosis Date  . HTN (hypertension)   . GERD (gastroesophageal reflux disease)   . Anxiety   . History of gallstones   . Obesity   . TMJ (temporomandibular joint disorder)    Past Surgical History  Procedure Date  . Cholecystectomy 1989  . Cesarean section   . Wisdom tooth extraction 2010  . Abdominal hysterectomy 09/2010    partial    reports that she quit smoking about 3 weeks ago. Her smoking use included Cigarettes. She smoked .25 packs per day. She has never used smokeless tobacco. She reports that she does not drink alcohol or use illicit drugs. family history includes Diabetes in her father; Heart disease in her father; Heart disease (age of onset:50) in her mother; and Hypertension in her father. Allergies  Allergen Reactions  . Bee Venom Shortness Of Breath  . Contrast Media (Iodinated Diagnostic Agents) Anaphylaxis    CT dye ivp Throat swelling  . Iohexol Anaphylaxis    THROAT SWELLS CLOSED      Outpatient Encounter Prescriptions as of 04/12/2011  Medication Sig Dispense  Refill  . cyclobenzaprine (FLEXERIL) 10 MG tablet Take 0.5 tablets (5 mg total) by mouth daily as needed. Muscle spasms  30 tablet  2  . dexlansoprazole (DEXILANT) 60 MG capsule Take 1 capsule (60 mg total) by mouth daily.  30 capsule  1  . ibuprofen (ADVIL,MOTRIN) 800 MG tablet Take 1 tablet (800 mg total) by mouth every 8 (eight) hours as needed. For cramps  30 tablet  2  . lisinopril-hydrochlorothiazide (PRINZIDE,ZESTORETIC) 20-12.5 MG per tablet Take 1 tablet by mouth daily.  90 tablet  3  . omeprazole (PRILOSEC) 20 MG capsule Take 1 capsule (20 mg total) by mouth daily as needed. For heartburn  30 capsule  11     REVIEW OF SYSTEMS  : Positive for fatigue. Positive for weight gain after recent discontinuation of smoking. All other systems reviewed and negative except where noted in the History of Present Illness.   PHYSICAL EXAM: BP 124/70  Pulse 76  Ht 5' 7.5" (1.715 m)  Wt 276 lb 2 oz (125.249 kg)  BMI 42.61 kg/m2 General: Well developed black female in no acute distress Head: Normocephalic and atraumatic Eyes:  sclerae anicteric,conjunctive pink. Ears: Normal auditory acuity Neck: Supple, no masses.  Lungs: Clear throughout to auscultation Heart: Regular rate and rhythm Abdomen: Soft, non distended, nontender. No masses or hepatomegaly noted. Normal Bowel sounds Rectal: not done Musculoskeletal: Symmetrical with no gross deformities  Skin: No lesions on visible extremities Extremities: No edema or deformities noted Neurological: Alert oriented grossly nonfocal  Cervical Nodes:  No significant cervical adenopathy Psychological:  Alert and cooperative. Normal mood and affect  ASSESSMENT AND PLAN;   46 year old black female referred for evaluation of chest discomfort associated with sensation of food "sitting in her stomach". She doesn't describe true esophageal dysphagia. Symptoms refractory to daily Prilosec which [patient has been on since January. PCP switched her to  Dexilant today. Continue daily PPI. For further evaluation of symptoms patient will be scheduled for EGD.   The benefits, risks, and potential complications of EGD with possible biopsies and/or dilation were discussed with the patient and she agrees to proceed.

## 2011-04-12 NOTE — Progress Notes (Signed)
  Subjective:    Patient ID: Beth Wallace, female    DOB: 06/28/1965, 46 y.o.   MRN: [id number]  HPI Patient comes in today for SDA for  evaluation.As directed   For this  . Having difficulty with chest pain not   cv cause  Hard to eat.  Burp  Ing. Uncomfortable to eat  Dx acid reflux years ago   In mouth  No burning and no bowelchanges.  Feels like ocass feels like gets stuck down low in the chest.  Pills triggers SX.  At times  Some bloating and no vomiting.   Has had no tobacco for 3 weeks   Review of Systems Feels tired going to gyme.  Has pms sx  Also. Is tressed and worried when looking at internet  For sx.   Past history family history social history reviewed in the electronic medical record.     Objective:   Physical Exam wdwn in nad anxious and stressed but well Gum chewing  Neck: Supple without adenopathy or masses or bruits Chest:  Clear to A&P without wheezes rales or rhonchi CV:  S1-S2 no gallops or murmurs peripheral perfusion is normal Abdomen:  Sof,t normal bowel sounds without hepatosplenomegaly, no guarding rebound or masses no CVA tenderness reveiwed  Cards   Appt.      Assessment & Plan:  Chest discomfort  And some dysphagia   Needs gi eval and poss endoscopy  R/o stricture or HH etc  Tobacco cessation. Continue  Fatigue prob related to tobacco cessation continue and exercise. HT CV risk   Life sytly    intervention at this time  for these factors. Hx of blood inurine  recheck today and is normal .    Avoid asa and nsaids for now

## 2011-04-16 ENCOUNTER — Encounter: Payer: Self-pay | Admitting: Gastroenterology

## 2011-04-17 ENCOUNTER — Encounter: Payer: Self-pay | Admitting: Nurse Practitioner

## 2011-04-17 NOTE — Progress Notes (Signed)
Reviewed and agree with management. Isbella Arline T. Rashied Corallo MD FACG 

## 2011-04-20 ENCOUNTER — Other Ambulatory Visit: Payer: 59 | Admitting: Gastroenterology

## 2011-04-24 ENCOUNTER — Telehealth: Payer: Self-pay | Admitting: Internal Medicine

## 2011-04-24 NOTE — Telephone Encounter (Signed)
Pt states her right ear hurts and it feels like there is irrigating the ear on the inside.  Pt aware of appt on 04/25/11.

## 2011-04-24 NOTE — Telephone Encounter (Signed)
Pt would like abx call [phone] for ear infection. Pt is having severe ear pain. Pt decline ov

## 2011-04-25 ENCOUNTER — Other Ambulatory Visit: Payer: Self-pay | Admitting: Internal Medicine

## 2011-04-25 ENCOUNTER — Encounter: Payer: Self-pay | Admitting: Internal Medicine

## 2011-04-25 ENCOUNTER — Ambulatory Visit (INDEPENDENT_AMBULATORY_CARE_PROVIDER_SITE_OTHER): Payer: 59 | Admitting: Internal Medicine

## 2011-04-25 VITALS — BP 120/74 | HR 90 | Temp 98.4°F | Wt 269.0 lb

## 2011-04-25 DIAGNOSIS — I10 Essential (primary) hypertension: Secondary | ICD-10-CM

## 2011-04-25 DIAGNOSIS — J029 Acute pharyngitis, unspecified: Secondary | ICD-10-CM

## 2011-04-25 DIAGNOSIS — R5383 Other fatigue: Secondary | ICD-10-CM

## 2011-04-25 DIAGNOSIS — R5381 Other malaise: Secondary | ICD-10-CM

## 2011-04-25 DIAGNOSIS — F17200 Nicotine dependence, unspecified, uncomplicated: Secondary | ICD-10-CM

## 2011-04-25 LAB — POCT RAPID STREP A (OFFICE): Rapid Strep A Screen: NEGATIVE

## 2011-04-25 MED ORDER — AMOXICILLIN 500 MG PO CAPS: 500.0000 mg | ORAL_CAPSULE | Freq: Two times a day (BID) | ORAL | Status: AC

## 2011-04-25 MED ORDER — FLUCONAZOLE 150 MG PO TABS: 150.0000 mg | ORAL_TABLET | Freq: Once | ORAL | Status: AC

## 2011-04-25 NOTE — Patient Instructions (Signed)
You have a throat infection that is causing your ears to hurt also.  Will notify you  of culture when available In the meantime we can begin on antibiotic in case this is bacterial throat infection. congrats on tobacco free.     Pharyngitis, Viral and Bacterial Pharyngitis is soreness (inflammation) or infection of the pharynx. It is also called a sore throat. CAUSES  Most sore throats are caused by viruses and are part of a cold. However, some sore throats are caused by strep and other bacteria. Sore throats can also be caused by post nasal drip from draining sinuses, allergies and sometimes from sleeping with an open mouth. Infectious sore throats can be spread from person to person by coughing, sneezing and sharing cups or eating utensils. TREATMENT  Sore throats that are viral usually last 3-4 days. Viral illness will get better without medications (antibiotics). Strep throat and other bacterial infections will usually begin to get better about 24-48 hours after you begin to take antibiotics. HOME CARE INSTRUCTIONS   If the caregiver feels there is a bacterial infection or if there is a positive strep test, they will prescribe an antibiotic. The full course of antibiotics must be taken. If the full course of antibiotic is not taken, you or your child may become ill again. If you or your child has strep throat and do not finish all of the medication, serious heart or kidney diseases may develop.   Drink enough water and fluids to keep your urine clear or pale yellow.   Only take over-the-counter or prescription medicines for pain, discomfort or fever as directed by your caregiver.   Get lots of rest.   Gargle with salt water ( tsp. of salt in a glass of water) as often as every 1-2 hours as you need for comfort.   Hard candies may soothe the throat if individual is not at risk for choking. Throat sprays or lozenges may also be used.  SEEK MEDICAL CARE IF:   Large, tender lumps in the  neck develop.   A rash develops.   Green, yellow-brown or bloody sputum is coughed up.   Your baby is older than 3 months with a rectal temperature of 100.5 F (38.1 C) or higher for more than 1 day.  SEEK IMMEDIATE MEDICAL CARE IF:   A stiff neck develops.   You or your child are drooling or unable to swallow liquids.   You or your child are vomiting, unable to keep medications or liquids down.   You or your child has severe pain, unrelieved with recommended medications.   You or your child are having difficulty breathing (not due to stuffy nose).   You or your child are unable to fully open your mouth.   You or your child develop redness, swelling, or severe pain anywhere on the neck.   You have a fever.   Your baby is older than 3 months with a rectal temperature of 102 F (38.9 C) or higher.   Your baby is 41 months old or younger with a rectal temperature of 100.4 F (38 C) or higher.  MAKE SURE YOU:   Understand these instructions.   Will watch your condition.   Will get help right away if you are not doing well or get worse.  Document Released: 01/01/2005 Document Revised: 12/21/2010 Document Reviewed: 03/31/2007 [facility] Patient Information 2012 [place], [place].

## 2011-04-25 NOTE — Progress Notes (Signed)
  Subjective:    Patient ID: Beth Wallace, female    DOB: 06-18-1965, 46 y.o.   MRN: [id number]  HPI  Patient comes in today for SDA for  new problem evaluation. Sore throat for 2 days and then significant pain mostly on the right to ears.   No significant congestion or cough unknown fever but feverish. Has been around some small children 46-year-old no known specific strep exposure.  She is still feeling tired but is still tobacco free.  Blood pressure is good wonders if the blood pressure medicine is causing some fatigue. She tried to stop it one day felt a little better and took it at night.  Review of Systems Negative for fever vomiting syncope rest as per history of present illness no current shortness of breath    Objective:   Physical Exam BP 120/74  Pulse 90  Temp(Src) 98.4 F (36.9 C) (Oral)  Wt 269 lb (122.018 kg)  SpO2 98% HEENT: Normocephalic ;atraumatic , Eyes;  PERRL, EOMs  Full, lids and conjunctiva clear,,Ears: no deformities canals nl, TM landmarks dull on right  Bony  Lm ok left is normal  Nose: no deformity or discharge  Mouth : OP very beefy red with tonsil 1+ no exudate  Nl airway Neck: Supple without  or masses or bruits very tender     Ac nodes      Assessment & Plan:   Acute   Pharyngitis and  Adenopathy  Rule out strep okay to add empiric treatment ending culture results. Because of severity of exam.   History of fatigue and hypertension it is not unreasonable when she is well to consider weaning off of pressure medication arch with switching and see if this is adding to her fatigue. If her blood pressure remains stable  we can continue off as long as she is continuing with her lifestyle interventions or weight loss.  Congratulations on continuing tobacco free.  She is to be getting more evaluation to include an endoscopy for her chest pain and GERD however there is some question whether insurance will pay enough of the fees that she can afford it. She is  going to look into this.

## 2011-04-27 LAB — CULTURE, GROUP A STREP: Organism ID, Bacteria: NORMAL

## 2011-04-30 ENCOUNTER — Ambulatory Visit (INDEPENDENT_AMBULATORY_CARE_PROVIDER_SITE_OTHER): Payer: 59 | Admitting: Nurse Practitioner

## 2011-04-30 DIAGNOSIS — R002 Palpitations: Secondary | ICD-10-CM

## 2011-04-30 NOTE — Progress Notes (Signed)
Exercise Treadmill Test  Pre-Exercise Testing Evaluation Rhythm: normal sinus  Rate: 76   PR:  .15 QRS:  .08  QT:  .36 QTc: .41     Test  Exercise Tolerance Test Ordering MD: Angelina Sheriff, MD  Interpreting MD:  Ward Givens NP  Unique Test No: 1   Treadmill:  1  Indication for ETT: chest pain - rule out ischemia  Contraindication to ETT: No   Stress Modality: exercise - treadmill  Cardiac Imaging Performed: non   Protocol: standard Bruce - maximal  Max BP: 183/59  Max MPHR (bpm):  177 85% MPR (bpm):  150  MPHR obtained (bpm):  157 % MPHR obtained:  87%  Reached 85% MPHR (min:sec):5:45 Total Exercise Time (min-sec):  7:17  Workload in METS:  9.0 Borg Scale: 14  Reason ETT Terminated:  dyspnea    ST Segment Analysis At Rest: normal ST segments - no evidence of significant ST depression With Exercise: no evidence of significant ST depression  Other Information Arrhythmia:  No Angina during ETT:  absent (0) Quality of ETT:  diagnostic  ETT Interpretation:  normal - no evidence of ischemia by ST analysis  Comments: No chest pain.  No acute st/t changes.  Recommendations: F/u Dr. Antoine Poche.

## 2011-05-01 NOTE — Progress Notes (Signed)
Quick Note:  Attempted to call pt; line busy. Will attempt to call at a later time. ______

## 2011-05-05 ENCOUNTER — Other Ambulatory Visit: Payer: Self-pay | Admitting: Internal Medicine

## 2011-05-18 ENCOUNTER — Ambulatory Visit (INDEPENDENT_AMBULATORY_CARE_PROVIDER_SITE_OTHER): Payer: 59 | Admitting: Internal Medicine

## 2011-05-18 ENCOUNTER — Encounter: Payer: Self-pay | Admitting: Internal Medicine

## 2011-05-18 VITALS — BP 100/70 | HR 77 | Temp 98.4°F | Wt 272.0 lb

## 2011-05-18 DIAGNOSIS — T7840XA Allergy, unspecified, initial encounter: Secondary | ICD-10-CM

## 2011-05-18 DIAGNOSIS — L299 Pruritus, unspecified: Secondary | ICD-10-CM

## 2011-05-18 DIAGNOSIS — I10 Essential (primary) hypertension: Secondary | ICD-10-CM

## 2011-05-18 DIAGNOSIS — Z91030 Bee allergy status: Secondary | ICD-10-CM

## 2011-05-18 HISTORY — DX: Allergy, unspecified, initial encounter: T78.40XA

## 2011-05-18 MED ORDER — HYDROXYZINE HCL 25 MG PO TABS: 25.0000 mg | ORAL_TABLET | Freq: Three times a day (TID) | ORAL | Status: AC | PRN

## 2011-05-18 MED ORDER — PREDNISONE 10 MG PO TABS: 10.0000 mg | ORAL_TABLET | Freq: Every day | ORAL | Status: DC

## 2011-05-18 NOTE — Progress Notes (Signed)
  Subjective:    Patient ID: Beth Wallace, female    DOB: October 31, 1965, 46 y.o.   MRN: [id number]  HPI Patient comes in today for SDA for  new problem evaluation. Went on trip to [place] for a weekend and came back and had bumps on forehead and now cheeks and jaw line .  Began itching all over including head but no rash or hives there.  No change in meds . 4 days ago onset of rash and itching all over  But no hives or swelling.  Has red palms but no swelling . Head itches  Took 25 mg benadryl oral and no better  Took all antibiotic last month and got better  .  Currently no cough sob  Cp  Used topical on face steroid / other.  No new other creams .   Has shellfish last week and straberries 3 days ago .  Had face waxed before travel had some cheek irritation.   Past hx of anaphylaxis to bee stings  Never got evaluation or rx .  Review of Systems No fever cp sob edema syncope never had varicella but had sibs in same hh with this.   Past history family history social history reviewed in the electronic medical record.     Objective:   Physical Exam BP 100/70  Pulse 77  Temp(Src) 98.4 F (36.9 C) (Oral)  Wt 272 lb (123.378 kg)  SpO2 97% WDWN in nad HEENT: Normocephalic ;atraumatic , Eyes;  PERRL, EOMs  Full, lids and conjunctiva clear,,Ears: no deformities, canals nl, TM landmarks normal, Nose: no deformity or discharge  Mouth : OP clear without lesion or edema . SKIN papules on  Forehead and left jaw line and few on cheek. erythema on cheek and palms  No other rash  scalp clear .  Chest:  Clear to A&P without wheezes rales or rhonchi CV:  S1-S2 no gallops or murmurs peripheral perfusion is normal Culture apparently not done  Although ordered . took rx as if was strep .    Assessment & Plan:  Itching  Rash with papules on forehead and face and cheeks  after waxing a week  Ago ? Allergic response  ? To what  Hold acei for now  And rx as such hydrox and pred if needed  Papules may be  contactant  Hx of sting anaphylaxis needs to see allergist   Will refer.   See instructions

## 2011-05-18 NOTE — Patient Instructions (Addendum)
This acts like  an allergic reaction.  ? To what . Call if getting a fever . For now  Stop the BP medication until we know you  are getting better.   Sometimes you can get a rash after infection  Or reaction to new topical  Food.  .  Anihistamines  Are the treatment of choice  And avoid shellfish and strawberries for now.  Try generic zyrtec  And can add hydroxyzine. .  But can make you drowsy. After better restart the BP medication and call if you break out.   If not getting better add the prednisone .  Will do allergy referral for your hx of sting allergy and also if this doesn't get better.

## 2011-05-24 ENCOUNTER — Telehealth: Payer: Self-pay | Admitting: Gastroenterology

## 2011-05-24 NOTE — Telephone Encounter (Signed)
No charge. 

## 2011-05-25 ENCOUNTER — Other Ambulatory Visit: Payer: 59 | Admitting: Gastroenterology

## 2011-11-22 ENCOUNTER — Other Ambulatory Visit: Payer: Self-pay | Admitting: Internal Medicine

## 2011-11-26 ENCOUNTER — Other Ambulatory Visit: Payer: Self-pay | Admitting: Family Medicine

## 2011-11-26 ENCOUNTER — Other Ambulatory Visit: Payer: Self-pay | Admitting: Internal Medicine

## 2011-11-26 MED ORDER — OMEPRAZOLE 20 MG PO CPDR: 20.0000 mg | DELAYED_RELEASE_CAPSULE | Freq: Every day | ORAL | Status: DC | PRN

## 2011-12-19 ENCOUNTER — Inpatient Hospital Stay (HOSPITAL_COMMUNITY)
Admission: AD | Admit: 2011-12-19 | Discharge: 2011-12-19 | Disposition: A | Discharge status: Home / Self Care | Payer: 59 | Source: Ambulatory Visit | Attending: Obstetrics and Gynecology | Admitting: Obstetrics and Gynecology

## 2011-12-19 ENCOUNTER — Encounter (HOSPITAL_COMMUNITY): Payer: Self-pay | Admitting: *Deleted

## 2011-12-19 DIAGNOSIS — R3989 Other symptoms and signs involving the genitourinary system: Secondary | ICD-10-CM | POA: Insufficient documentation

## 2011-12-19 DIAGNOSIS — R109 Unspecified abdominal pain: Secondary | ICD-10-CM | POA: Insufficient documentation

## 2011-12-19 DIAGNOSIS — R823 Hemoglobinuria: Secondary | ICD-10-CM | POA: Insufficient documentation

## 2011-12-19 DIAGNOSIS — M549 Dorsalgia, unspecified: Secondary | ICD-10-CM | POA: Insufficient documentation

## 2011-12-19 LAB — URINALYSIS, ROUTINE W REFLEX MICROSCOPIC
Ketones, ur: NEGATIVE mg/dL
Leukocytes, UA: NEGATIVE
Nitrite: NEGATIVE
Protein, ur: NEGATIVE mg/dL

## 2011-12-19 LAB — URINE MICROSCOPIC-ADD ON

## 2011-12-19 MED ORDER — KETOROLAC TROMETHAMINE 60 MG/2ML IM SOLN: 60.0000 mg | Freq: Once | INTRAMUSCULAR | Status: DC

## 2011-12-19 MED ORDER — KETOROLAC TROMETHAMINE 60 MG/2ML IM SOLN
60.0000 mg | Freq: Once | INTRAMUSCULAR | Status: AC
  Administered 2011-12-19: 60 mg via INTRAMUSCULAR
  Filled 2011-12-19: qty 2

## 2011-12-19 NOTE — MAU Provider Note (Signed)
History     CSN: [id number]  Arrival date and time: 12/19/11 4098   First Provider Initiated Contact with Patient 12/19/11 2019      Chief Complaint  Patient presents with  . Cystitis  . Back Pain  . Flank Pain   HPI Beth Wallace 46 y.o. Comes to MAU with pressure in urethra and bladder.  Feels like a catheter was just removed.  Does not have any pain when urinating.  Had this problem earlier in November.  Gave a urine specimen at the office.  Took a week of medication - unsure of the name.  Symptoms improved for a few days and then returned and the symptoms were worse.   The office called and the urine culture did not show an infection.  Also has back pain that moves from side to side.  Has tried several medications for this back pain and pressure in the urethra.  Flexeril did not help.  Ibuprofen did seem to help but felt like she could not keep taking Ibuprofen all the time.  Tried Tylenol today but it did not help.  Denies any history or family history of kidney stones.   Has had a previous history of blood in her urine at her primary care doctor's office.  Had follow up but no problem found.  Denies any problems with vaginal discharge.  OB History    Grav Para Term Preterm Abortions TAB SAB Ect Mult Living   2 1 1        0      Past Medical History  Diagnosis Date  . HTN (hypertension)   . GERD (gastroesophageal reflux disease)   . Anxiety   . History of gallstones   . Obesity   . TMJ (temporomandibular joint disorder)     Past Surgical History  Procedure Date  . Cholecystectomy 1989  . Cesarean section   . Wisdom tooth extraction 2010  . Abdominal hysterectomy 09/2010    partial    Family History  Problem Relation Age of Onset  . Heart disease Mother 63    Died age 73, MI  . Hypertension Father   . Diabetes Father   . Heart disease Father     History  Substance Use Topics  . Smoking status: Current Every Day Smoker -- 0.2 packs/day    Types: Cigarettes     Last Attempt to Quit: 03/16/2011  . Smokeless tobacco: Never Used  . Alcohol Use: No    Allergies:  Allergies  Allergen Reactions  . Bee Venom Shortness Of Breath  . Contrast Media (Iodinated Diagnostic Agents) Anaphylaxis    CT dye ivp Throat swelling    Prescriptions prior to admission  Medication Sig Dispense Refill  . cyclobenzaprine (FLEXERIL) 10 MG tablet Take 0.5 tablets (5 mg total) by mouth daily as needed. Muscle spasms  30 tablet  2  . ibuprofen (ADVIL,MOTRIN) 800 MG tablet Take 1 tablet (800 mg total) by mouth every 8 (eight) hours as needed. For cramps  30 tablet  2  . lisinopril-hydrochlorothiazide (PRINZIDE,ZESTORETIC) 20-12.5 MG per tablet Take 1 tablet by mouth daily.  90 tablet  3  . omeprazole (PRILOSEC) 20 MG capsule Take 1 capsule (20 mg total) by mouth daily as needed. For heartburn  30 capsule  3    Review of Systems  Constitutional: Negative for fever.  Gastrointestinal: Negative for nausea, vomiting, abdominal pain, diarrhea and constipation.  Genitourinary: Negative for dysuria.       Pressure in urethra like  a catheter was removed.  Denies dysuria.  No pain with urination.  Musculoskeletal: Positive for back pain.   Physical Exam   Blood pressure 145/79, pulse 73, temperature 99 F (37.2 C), temperature source Oral, resp. rate 18, height 5\' 8"  (1.727 m), weight 126.372 kg (278 lb 9.6 oz).  Physical Exam  Nursing note and vitals reviewed. Constitutional: She is oriented to person, place, and time. She appears well-developed and well-nourished.  HENT:  Head: Normocephalic.  Eyes: EOM are normal.  Neck: Neck supple.  GI: Soft. There is no tenderness. There is no rebound and no guarding.  Genitourinary:       Speculum exam: Vulva - negative Vagina - largel amount of creamy discharge, no odor Cervix - surgically absent Bimanual exam: Uterus surgically absent, Mild tenderness over bladder Adnexa mild tenderness in LLQ GC/Chlam, wet prep  done Chaperone present for exam. Bilateral flank tenderness - mildly tender  Musculoskeletal: Normal range of motion.  Neurological: She is alert and oriented to person, place, and time.  Skin: Skin is warm and dry.  Psychiatric: She has a normal mood and affect.    MAU Course  Procedures  MDM  2100  Consult with Dr. Dareen Piano re: plan of care   Results for orders placed during the hospital encounter of 12/19/11 (from the past 24 hour(s))  URINALYSIS, ROUTINE W REFLEX MICROSCOPIC     Status: Abnormal   Collection Time   12/19/11  7:50 PM      Component Value Range   Color, Urine YELLOW  YELLOW   APPearance CLEAR  CLEAR   Specific Gravity, Urine 1.010  1.005 - 1.030   pH 5.0  5.0 - 8.0   Glucose, UA NEGATIVE  NEGATIVE mg/dL   Hgb urine dipstick MODERATE (*) NEGATIVE   Bilirubin Urine NEGATIVE  NEGATIVE   Ketones, ur NEGATIVE  NEGATIVE mg/dL   Protein, ur NEGATIVE  NEGATIVE mg/dL   Urobilinogen, UA 0.2  0.0 - 1.0 mg/dL   Nitrite NEGATIVE  NEGATIVE   Leukocytes, UA NEGATIVE  NEGATIVE  URINE MICROSCOPIC-ADD ON     Status: Normal   Collection Time   12/19/11  7:50 PM      Component Value Range   Squamous Epithelial / LPF RARE  RARE   WBC, UA 0-2  <3 WBC/hpf   RBC / HPF 0-2  <3 RBC/hpf  WET PREP, GENITAL     Status: Abnormal   Collection Time   12/19/11  8:30 PM      Component Value Range   Yeast Wet Prep HPF POC NONE SEEN  NONE SEEN   Trich, Wet Prep NONE SEEN  NONE SEEN   Clue Cells Wet Prep HPF POC MODERATE (*) NONE SEEN   WBC, Wet Prep HPF POC FEW (*) NONE SEEN    Assessment and Plan  Hemoglobinuria Pressure in bladder Flank pain  Plan Will give Toradol 60 mg IM today for pain. No infection found today. Drink at least 8 8-oz glasses of water every day. Contact Dr. Henderson Cloud or primary care doctor tomorrow to arrange for further evaluation.  Gearldean Lomanto 12/19/2011, 8:34 PM

## 2011-12-19 NOTE — Progress Notes (Signed)
Written and verbal d/c instructions given and understanding voiced. Pt plans to call PCP in am for follow up

## 2011-12-19 NOTE — MAU Note (Signed)
Having a lot of pressure in bladder. Tested for uti couple wks ago and urine was negative. Cont to have pain like someone just took out a catheter. Hurts constantly. Also have pain L side of back that moves to R and back and forth. Have had bladder pressure and back pain since mid-Nov. Was given med for uti and took it all and it got better but then came right back

## 2011-12-20 ENCOUNTER — Telehealth: Payer: Self-pay | Admitting: Internal Medicine

## 2011-12-20 NOTE — Telephone Encounter (Signed)
[facility] Triage Call Report Triage Record Num: [id number] Operator: Albertine Grates Patient Name: Beth Wallace Call Date & Time: 12/19/2011 5:23:15PM Patient Phone: [phone] PCP: [name]. Panosh Patient Gender: Female PCP Fax : [phone] Patient DOB: [birth year] [facility] Reason for Call: Caller: [name]/Patient; PCP: Berniece Andreas (Family Practice); CB#: [phone]; THought had urinary tract infection and was seen by GYN. Had urine culture done but showed nothing. Took antibiotic but continues to have pressure in vaginal area. Has pain in flank as well. Has had symptoms for "about 2 weeks". Pain is unbearable at times. Advised ED per FLank Pain protocol due to sudden onset colicky pain in flank. Protocol(s) Used: Flank Pain Recommended Outcome per Protocol: See Provider within 4 hours Reason for Outcome: Sudden onset of colicky pain that may radiate to groin or perineum; bloody urine may follow episode Care Advice: ~ 12/

## 2011-12-20 NOTE — Telephone Encounter (Signed)
Patient called stating that she went to the ER and they stated that she need a referral to a Urologist as they saw blood in her urine. Please assist.

## 2011-12-20 NOTE — Telephone Encounter (Signed)
Pt called req status of getting a referral to a Urologist. Pt has blood in urine. Went to ER and was dx with possible kidney stones. Pt said pain is unbearable. Pls call asap.

## 2011-12-21 NOTE — Telephone Encounter (Signed)
Tried reaching the pt.  Received a busy signal.  Will try again at a later time. 

## 2011-12-21 NOTE — Telephone Encounter (Signed)
Pt following up on call from yesterday. Does she need to come in for a referral to urologist? If so, can she see another provider? Pt states pain is still unbearable. Pls call. [phone]

## 2011-12-21 NOTE — Telephone Encounter (Signed)
Tried reaching the pt and received a busy signal.  Will try again at a later time.

## 2011-12-21 NOTE — Telephone Encounter (Signed)
Left message on voicemail for the pt to return my call.

## 2011-12-24 NOTE — Telephone Encounter (Signed)
Ok to refer  To urologist.

## 2011-12-24 NOTE — Telephone Encounter (Signed)
Left message on home phone for the pt to return my call. 

## 2011-12-24 NOTE — Telephone Encounter (Signed)
I spoke to the pt.  She was recently seen at [facility].  She said all testing came back negative.  She continues to have pain on both sides below her ribs.  The pain moves from one side to the other.  After she urinates she has discomfort.  No pelvic pain.  Was told that she may have kidney stones.  Would like a referral to urology.  Please advise.  Thanks!!!

## 2011-12-25 ENCOUNTER — Other Ambulatory Visit: Payer: Self-pay | Admitting: Family Medicine

## 2011-12-25 DIAGNOSIS — R300 Dysuria: Secondary | ICD-10-CM

## 2011-12-25 NOTE — Telephone Encounter (Signed)
Order placed in the system.  The pt was notified that this process may take a few days.

## 2011-12-27 ENCOUNTER — Other Ambulatory Visit (HOSPITAL_COMMUNITY): Payer: Self-pay | Admitting: Obstetrics and Gynecology

## 2011-12-27 DIAGNOSIS — R1031 Right lower quadrant pain: Secondary | ICD-10-CM

## 2011-12-27 DIAGNOSIS — R1032 Left lower quadrant pain: Secondary | ICD-10-CM

## 2011-12-31 ENCOUNTER — Ambulatory Visit (HOSPITAL_COMMUNITY)
Admission: RE | Admit: 2011-12-31 | Discharge: 2011-12-31 | Disposition: A | Discharge status: Home / Self Care | Payer: 59 | Source: Ambulatory Visit | Attending: Obstetrics and Gynecology | Admitting: Obstetrics and Gynecology

## 2011-12-31 ENCOUNTER — Other Ambulatory Visit (HOSPITAL_COMMUNITY): Payer: Self-pay | Admitting: Obstetrics and Gynecology

## 2011-12-31 DIAGNOSIS — R1031 Right lower quadrant pain: Secondary | ICD-10-CM

## 2011-12-31 DIAGNOSIS — K449 Diaphragmatic hernia without obstruction or gangrene: Secondary | ICD-10-CM | POA: Insufficient documentation

## 2011-12-31 DIAGNOSIS — N83209 Unspecified ovarian cyst, unspecified side: Secondary | ICD-10-CM | POA: Insufficient documentation

## 2011-12-31 DIAGNOSIS — Z90710 Acquired absence of both cervix and uterus: Secondary | ICD-10-CM | POA: Insufficient documentation

## 2011-12-31 DIAGNOSIS — R933 Abnormal findings on diagnostic imaging of other parts of digestive tract: Secondary | ICD-10-CM | POA: Insufficient documentation

## 2011-12-31 DIAGNOSIS — R1032 Left lower quadrant pain: Secondary | ICD-10-CM

## 2011-12-31 MED ORDER — IOHEXOL 300 MG/ML  SOLN
80.0000 mL | Freq: Once | INTRAMUSCULAR | Status: AC | PRN
  Administered 2011-12-31: 80 mL via INTRAVENOUS

## 2012-01-16 ENCOUNTER — Other Ambulatory Visit: Payer: Self-pay | Admitting: Internal Medicine

## 2012-01-17 ENCOUNTER — Other Ambulatory Visit: Payer: Self-pay | Admitting: Internal Medicine

## 2012-01-18 ENCOUNTER — Other Ambulatory Visit: Payer: Self-pay | Admitting: Family Medicine

## 2012-01-18 MED ORDER — OMEPRAZOLE 20 MG PO CPDR: 20.0000 mg | DELAYED_RELEASE_CAPSULE | Freq: Every day | ORAL | Status: DC | PRN

## 2012-01-23 ENCOUNTER — Encounter: Payer: Self-pay | Admitting: Internal Medicine

## 2012-01-23 ENCOUNTER — Ambulatory Visit (INDEPENDENT_AMBULATORY_CARE_PROVIDER_SITE_OTHER): Payer: 59 | Admitting: Internal Medicine

## 2012-01-23 VITALS — BP 130/80 | HR 79 | Temp 98.1°F | Wt 280.0 lb

## 2012-01-23 DIAGNOSIS — Z23 Encounter for immunization: Secondary | ICD-10-CM

## 2012-01-23 DIAGNOSIS — K219 Gastro-esophageal reflux disease without esophagitis: Secondary | ICD-10-CM

## 2012-01-23 DIAGNOSIS — R002 Palpitations: Secondary | ICD-10-CM

## 2012-01-23 DIAGNOSIS — Z733 Stress, not elsewhere classified: Secondary | ICD-10-CM

## 2012-01-23 DIAGNOSIS — R109 Unspecified abdominal pain: Secondary | ICD-10-CM

## 2012-01-23 DIAGNOSIS — I10 Essential (primary) hypertension: Secondary | ICD-10-CM

## 2012-01-23 DIAGNOSIS — Z91030 Bee allergy status: Secondary | ICD-10-CM

## 2012-01-23 DIAGNOSIS — F439 Reaction to severe stress, unspecified: Secondary | ICD-10-CM

## 2012-01-23 DIAGNOSIS — Z91038 Other insect allergy status: Secondary | ICD-10-CM

## 2012-01-23 DIAGNOSIS — R1010 Upper abdominal pain, unspecified: Secondary | ICD-10-CM

## 2012-01-23 DIAGNOSIS — R82998 Other abnormal findings in urine: Secondary | ICD-10-CM

## 2012-01-23 DIAGNOSIS — R8290 Unspecified abnormal findings in urine: Secondary | ICD-10-CM

## 2012-01-23 HISTORY — DX: Palpitations: R00.2

## 2012-01-23 LAB — CBC WITH DIFFERENTIAL/PLATELET
Basophils Relative: 0.6 % (ref 0.0–3.0)
Eosinophils Relative: 1.5 % (ref 0.0–5.0)
HCT: 41.4 % (ref 36.0–46.0)
Hemoglobin: 13.8 g/dL (ref 12.0–15.0)
MCV: 88.6 fl (ref 78.0–100.0)
Monocytes Absolute: 0.3 10*3/uL (ref 0.1–1.0)
Neutrophils Relative %: 49.9 % (ref 43.0–77.0)
RBC: 4.67 Mil/uL (ref 3.87–5.11)
WBC: 6.3 10*3/uL (ref 4.5–10.5)

## 2012-01-23 LAB — BASIC METABOLIC PANEL
Chloride: 102 mEq/L (ref 96–112)
Creatinine, Ser: 0.7 mg/dL (ref 0.4–1.2)
Potassium: 3.9 mEq/L (ref 3.5–5.1)
Sodium: 137 mEq/L (ref 135–145)

## 2012-01-23 LAB — URINALYSIS, ROUTINE W REFLEX MICROSCOPIC
Bilirubin Urine: NEGATIVE
Ketones, ur: NEGATIVE
Leukocytes, UA: NEGATIVE
Urobilinogen, UA: 0.2 (ref 0.0–1.0)

## 2012-01-23 LAB — HEPATIC FUNCTION PANEL
ALT: 31 U/L (ref 0–35)
Bilirubin, Direct: 0 mg/dL (ref 0.0–0.3)
Total Protein: 7.5 g/dL (ref 6.0–8.3)

## 2012-01-23 LAB — LIPID PANEL
Total CHOL/HDL Ratio: 6
Triglycerides: 126 mg/dL (ref 0.0–149.0)

## 2012-01-23 LAB — MAGNESIUM: Magnesium: 1.7 mg/dL (ref 1.5–2.5)

## 2012-01-23 LAB — T3, FREE: T3, Free: 2.4 pg/mL (ref 2.3–4.2)

## 2012-01-23 LAB — TSH: TSH: 0.63 u[IU]/mL (ref 0.35–5.50)

## 2012-01-23 MED ORDER — ALPRAZOLAM 0.25 MG PO TABS: 0.2500 mg | ORAL_TABLET | Freq: Three times a day (TID) | ORAL | Status: DC | PRN

## 2012-01-23 NOTE — Progress Notes (Signed)
Chief Complaint  Patient presents with  . Gastrophageal Reflux    Sometimes has a rapid heart rate and the reflux can become painful.    HPI: Patient comes in today for follow up of  multiple medical problems.   Ever since hysterectomy has had sx  About year ago has had problems with her stomach. still has ovaries  Has had ct scan  recently because of blood in her urine and abdominal pain negative GYN evaluation normal scan     Gyne eval.  To see urologist.  Soon.for the hematuria . Wasn't able to get the endoscopy in the spring for various reasons that was planned last year. We'll plan on doing this after completion of a murder trial in [place] Speeding heart.  After beginning abdominal pain wonders if it's related to her GI tract are stress she's worried about her heart but did have a valuation in the past  No chest pain but  abd pain and then rapid heart rate but seems to be cyclical.  Tobacco  Down to  1-2 per day. Trying to stop She states the abdominal pain isn't really helped bynexium   Or prilosec . Once a day asked about increase however she's only taking the medicine as needed when she gets the problem and not on a daily basis  Trial  in regard to her daughter and sisters murder to begin at the end of the month January and February will be out of town in [place] at this time  ROS: See pertinent positives and negatives per HPI. Tends to get swelling when she swallows any of her medicines but no itching sneezing shortness of breath should get with ibuprofen or her blood pressure pill uncertain as she gets with Prilosec. She's been evaluated in allergic to just about everything. She does carry her medications with her EpiPen Zyrtec and Benadryl we'll plan on desensitization injections when things are calmer.  Past Medical History  Diagnosis Date  . HTN (hypertension)   . GERD (gastroesophageal reflux disease)   . Anxiety   . History of gallstones   . Obesity   . TMJ  (temporomandibular joint disorder)     Family History  Problem Relation Age of Onset  . Heart disease Mother 60    Died age 28, MI  . Hypertension Father   . Diabetes Father   . Heart disease Father     History   Social History  . Marital Status: Single    Spouse Name: N/A    Number of Children: 1  . Years of Education: N/A   Occupational History  . manager    Social History Main Topics  . Smoking status: Current Every Day Smoker -- 0.2 packs/day    Types: Cigarettes    Last Attempt to Quit: 03/16/2011  . Smokeless tobacco: Never Used  . Alcohol Use: No  . Drug Use: No  . Sexually Active: None   Other Topics Concern  . 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 cyclobenzaprine (FLEXERIL) 10 MG tablet Take 0.5 tablets (5 mg total) by mouth daily as needed. Muscle spasms  30 tablet  2  . ibuprofen (ADVIL,MOTRIN) 800 MG tablet TAKE 1 TABLET BY MOUTH EVERY 8 HOURS AS NEEDED FOR CRAMPS  30 tablet  2  . lisinopril-hydrochlorothiazide (PRINZIDE,ZESTORETIC) 20-12.5 MG per tablet Take 1 tablet by mouth daily.  90 tablet  3  . omeprazole (PRILOSEC) 20 MG capsule Take 1 capsule (20 mg total) by mouth daily as needed. For heartburn  90 capsule  0  . ALPRAZolam (XANAX) 0.25 MG tablet Take 1 tablet (0.25 mg total) by mouth 3 (three) times daily as needed for anxiety.  24 tablet  0    EXAM: Wt Readings from Last 3 Encounters:  01/23/12 280 lb (127.007 kg)  12/19/11 278 lb 9.6 oz (126.372 kg)  05/18/11 272 lb (123.378 kg)   BP Readings from Last 3  Encounters:  01/23/12 130/80  12/19/11 136/84  05/18/11 100/70     BP 130/80  Pulse 79  Temp 98.1 F (36.7 C) (Oral)  Wt 280 lb (127.007 kg)  SpO2 98%  There is no height on file to calculate BMI. Repeat blood pressures improved GENERAL: vitals reviewed and listed above, alert, oriented, appears well hydrated and in no acute distress  HEENT: atraumatic, conjunctiva  clear, no obvious abnormalities on inspection of external nose and ears OP : no lesion edema or exudate   NECK: no obvious masses on inspection palpation   LUNGS: clear to auscultation bilaterally, no wheezes, rales or rhonchi, good air movement Abdomen:  Sof,t normal bowel sounds without hepatosplenomegaly, no guarding rebound or masses no CVA tenderness CV: HRRR, no clubbing cyanosis or  peripheral edema nl cap refill   MS: moves all extremities without noticeable focal  abnormality  PSYCH: pleasant and cooperative, no obvious depression  mildy anxious good insight   ASSESSMENT AND PLAN:  Discussed the following assessment and plan:  1. HYPERTENSION  Basic metabolic panel, CBC with Differential, Hepatic function panel, Lipid panel, TSH, T4, free, T3, free, Magnesium   On medication improved it second reading follow lifestyle interventions  2. GERD (gastroesophageal reflux disease)  Basic metabolic panel, CBC with Differential, Hepatic function panel, Lipid panel, TSH, T4, free, T3, free, Magnesium   Gastritis abdominal pain unable to follow through with endoscopy yet because of other issues will call after the trial. Discussed how to take the PPI every day   3. Upper abdominal pain  Basic metabolic panel, CBC with Differential, Hepatic function panel, Lipid panel, TSH, T4, free, T3, free, Magnesium   PPI every day not as needed then followup if not helpful  4. Abnormal urine  Basic metabolic panel, CBC with Differential, Hepatic function panel, Lipid panel, TSH, T4, free, T3, free, Magnesium, Urinalysis, Routine  w reflex microscopic   Had blood in the urine negative CT scan no stone keep appointment with urologist  5. Stress  Basic metabolic panel, CBC with Differential, Hepatic function panel, Lipid panel, TSH, T4, free, T3, free, Magnesium   murdered daughter and sister capital trial  to commence in [place] end of the month.counseled xanax if needed caution disc.  6. Rapid palpitations  Basic metabolic panel, CBC with Differential, Hepatic function panel, Lipid panel, TSH, T4, free, T3, free, Magnesium   Probably related to stress had negative cardiac workup in the past rule out metabolic  7. Need for prophylactic vaccination and  inoculation against influenza    8. H/O bee sting allergy     Counseled. About above  Total visit > 50% spent counseling and coordinating care   -Patient advised to return or notify health care team  immediately if symptoms worsen or persist or new concerns arise.  Patient Instructions  You blood pressure  Is good after rest. Your heart sounds normal Keep the urology appt.  Will notify you  of labs when available. To make sure not affecting heart rate. Take  Acid blocker  Every day twice is ok for now to be able to see if works on the abd pain.     Can use  Xanax as needed for trial . Fu after return.  Please stop tobacco    Diet for Gastroesophageal Reflux Disease, Adult Reflux (acid reflux) is when acid from your stomach flows up into the esophagus. When acid comes in contact with the esophagus, the acid causes irritation and soreness (inflammation) in the esophagus. When reflux happens often or so severely that it causes damage to the esophagus, it is called gastroesophageal reflux disease (GERD). Nutrition therapy can help ease the discomfort of GERD. FOODS OR DRINKS TO AVOID OR LIMIT  Smoking or chewing tobacco. Nicotine is one of the most potent stimulants to acid production in the gastrointestinal tract.  Caffeinated and decaffeinated coffee and  black tea.  Regular or low-calorie carbonated beverages or energy drinks (caffeine-free carbonated beverages are allowed).   Strong spices, such as black pepper, white pepper, red pepper, cayenne, curry powder, and chili powder.  Peppermint or spearmint.  Chocolate.  High-fat foods, including meats and fried foods. Extra added fats including oils, butter, salad dressings, and nuts. Limit these to less than 8 tsp per day.  Fruits and vegetables if they are not tolerated, such as citrus fruits or tomatoes.  Alcohol.  Any food that seems to aggravate your condition. If you have questions regarding your diet, call your caregiver or a registered dietitian. OTHER THINGS THAT MAY HELP GERD INCLUDE:   Eating your meals slowly, in a relaxed setting.  Eating 5 to 6 small meals per day instead of 3 large meals.  Eliminating food for a period of time if it causes distress.  Not lying down until 3 hours after eating a meal.  Keeping the head of your bed raised 6 to 9 inches (15 to 23 cm) by using a foam wedge or blocks under the legs of the bed. Lying flat may make symptoms worse.  Being physically active. Weight loss may be helpful in reducing reflux in overweight or obese adults.  Wear loose fitting clothing EXAMPLE MEAL PLAN This meal plan is approximately 2,000 calories based on https://www.bernard.org/ meal planning guidelines. Breakfast   cup cooked oatmeal.  1 cup strawberries.  1 cup low-fat milk.  1 oz almonds. Snack  1 cup cucumber slices.  6 oz yogurt (made from low-fat or fat-free milk). Lunch  2 slice whole-wheat bread.  2 oz sliced [place].  2 tsp mayonnaise.  1 cup blueberries.  1 cup snap peas. Snack  6 whole-wheat crackers.  1 oz string cheese. Dinner   cup brown rice.  1 cup mixed veggies.  1 tsp olive oil.  3 oz grilled fish. Document Released: 01/01/2005 Document Revised: 03/26/2011 Document Reviewed: 11/17/2010 [facility] Patient  Information 2013 [place], [place].      [name]. Venia Riveron M.D.

## 2012-01-23 NOTE — Patient Instructions (Addendum)
You blood pressure  Is good after rest. Your heart sounds normal Keep the urology appt.  Will notify you  of labs when available. To make sure not affecting heart rate. Take  Acid blocker  Every day twice is ok for now to be able to see if works on the abd pain.     Can use  Xanax as needed for trial . Fu after return.  Please stop tobacco    Diet for Gastroesophageal Reflux Disease, Adult Reflux (acid reflux) is when acid from your stomach flows up into the esophagus. When acid comes in contact with the esophagus, the acid causes irritation and soreness (inflammation) in the esophagus. When reflux happens often or so severely that it causes damage to the esophagus, it is called gastroesophageal reflux disease (GERD). Nutrition therapy can help ease the discomfort of GERD. FOODS OR DRINKS TO AVOID OR LIMIT  Smoking or chewing tobacco. Nicotine is one of the most potent stimulants to acid production in the gastrointestinal tract.  Caffeinated and decaffeinated coffee and black tea.  Regular or low-calorie carbonated beverages or energy drinks (caffeine-free carbonated beverages are allowed).   Strong spices, such as black pepper, white pepper, red pepper, cayenne, curry powder, and chili powder.  Peppermint or spearmint.  Chocolate.  High-fat foods, including meats and fried foods. Extra added fats including oils, butter, salad dressings, and nuts. Limit these to less than 8 tsp per day.  Fruits and vegetables if they are not tolerated, such as citrus fruits or tomatoes.  Alcohol.  Any food that seems to aggravate your condition. If you have questions regarding your diet, call your caregiver or a registered dietitian. OTHER THINGS THAT MAY HELP GERD INCLUDE:   Eating your meals slowly, in a relaxed setting.  Eating 5 to 6 small meals per day instead of 3 large meals.  Eliminating food for a period of time if it causes distress.  Not lying down until 3 hours after eating a  meal.  Keeping the head of your bed raised 6 to 9 inches (15 to 23 cm) by using a foam wedge or blocks under the legs of the bed. Lying flat may make symptoms worse.  Being physically active. Weight loss may be helpful in reducing reflux in overweight or obese adults.  Wear loose fitting clothing EXAMPLE MEAL PLAN This meal plan is approximately 2,000 calories based on https://www.bernard.org/ meal planning guidelines. Breakfast   cup cooked oatmeal.  1 cup strawberries.  1 cup low-fat milk.  1 oz almonds. Snack  1 cup cucumber slices.  6 oz yogurt (made from low-fat or fat-free milk). Lunch  2 slice whole-wheat bread.  2 oz sliced [place].  2 tsp mayonnaise.  1 cup blueberries.  1 cup snap peas. Snack  6 whole-wheat crackers.  1 oz string cheese. Dinner   cup brown rice.  1 cup mixed veggies.  1 tsp olive oil.  3 oz grilled fish. Document Released: 01/01/2005 Document Revised: 03/26/2011 Document Reviewed: 11/17/2010 [facility] Patient Information 2013 [place], [place].

## 2012-01-23 NOTE — Assessment & Plan Note (Signed)
Desensitization  Delayed cause of external factors.

## 2012-02-11 ENCOUNTER — Encounter: Payer: Self-pay | Admitting: Internal Medicine

## 2012-02-20 ENCOUNTER — Other Ambulatory Visit: Payer: Self-pay | Admitting: Internal Medicine

## 2012-03-05 ENCOUNTER — Encounter: Payer: Self-pay | Admitting: Gastroenterology

## 2012-03-05 ENCOUNTER — Ambulatory Visit (INDEPENDENT_AMBULATORY_CARE_PROVIDER_SITE_OTHER): Payer: 59 | Admitting: Gastroenterology

## 2012-03-05 VITALS — BP 120/80 | HR 68 | Ht 67.5 in | Wt 278.8 lb

## 2012-03-05 DIAGNOSIS — K219 Gastro-esophageal reflux disease without esophagitis: Secondary | ICD-10-CM

## 2012-03-05 DIAGNOSIS — R109 Unspecified abdominal pain: Secondary | ICD-10-CM

## 2012-03-05 DIAGNOSIS — R141 Gas pain: Secondary | ICD-10-CM

## 2012-03-05 MED ORDER — RIFAXIMIN 550 MG PO TABS: 550.0000 mg | ORAL_TABLET | Freq: Two times a day (BID) | ORAL | Status: DC

## 2012-03-05 NOTE — Progress Notes (Signed)
History of Present Illness: This is a 47 year old female complains of gas, bloating, belching, flatulence, left back and left flank pain. She has chronic GERD which has been treated with daily omeprazole. She underwent a CT scan in December 2013 that was unremarkable. Recent blood work was unremarkable. She was previously recommended to undergo endoscopy in May 2013 but she canceled the procedure and has not rescheduled. She states she is under significant stress at this time related a trial for her daughter's murder in [place]. She's tried adjusting her diet,Gas-X twice daily and several probiotics without improvement in her symptoms. She has noted mild constipation over the past few weeks with a change in bowel function from one bowel movement daily to one bowel movement every other day. She does note some improvement in her left flank and left back pain after a bowel movement. She takes ibuprofen frequently. Denies weight loss, diarrhea, change in stool caliber, melena, hematochezia, nausea, vomiting, dysphagia, chest pain.  Current Medications, Allergies, Past Medical History, Past Surgical History, Family History and Social History were reviewed in [employer] record.  Physical Exam: General: Well developed , well nourished, no acute distress Head: Normocephalic and atraumatic Eyes:  sclerae anicteric, EOMI Ears: Normal auditory acuity Mouth: No deformity or lesions Lungs: Clear throughout to auscultation Heart: Regular rate and rhythm; no murmurs, rubs or bruits Abdomen: Soft, non tender and non distended. No masses, hepatosplenomegaly or hernias noted. Normal Bowel sounds Musculoskeletal: Symmetrical with no gross deformities  Pulses:  Normal pulses noted Extremities: No clubbing, cyanosis, edema or deformities noted Neurological: Alert oriented x 4, grossly nonfocal Psychological:  Alert and cooperative. Anxious.  Assessment and Recommendations:  1. Gas,  bloating, flatulence, belching, mild constipation, left flank and left back pain. Begin MiraLax once daily as needed for adequate daily bowel movements. Begin a course of Xifaxan for possible bacterial overgrowth. Increase Gas-X to 4 times a day. Begin a low gas diet and antireflux measures. Obtain stool Hemoccults. Schedule upper endoscopy to evaluate for GERD and ulcer disease. The risks, benefits, and alternatives to endoscopy with possible biopsy and possible dilation were discussed with the patient and they consent to proceed.

## 2012-03-05 NOTE — Patient Instructions (Addendum)
You have been scheduled for an endoscopy with propofol. Please follow written instructions given to you at your visit today. If you use inhalers (even only as needed) or a CPAP machine, please bring them with you on the day of your procedure.  Please follow instructions on Hemoccult cards and mail them back to [name] when finished.   Start over the counter Gas-X four times a day for gas and bloating.   You have been given a low gas diet.   We have sent the following medications to your pharmacy for you to pick up at your convenience: Xifaxan.   Patient advised to avoid spicy, acidic, citrus, chocolate, mints, fruit and fruit juices.  Limit the intake of caffeine, alcohol and Soda.  Don't exercise too soon after eating.  Don't lie down within 3-4 hours of eating.  Elevate the head of your bed.  Thank you for choosing me and [facility].  Venita Lick. Pleas Koch., MD., [facility]

## 2012-03-07 ENCOUNTER — Ambulatory Visit: Payer: 59 | Admitting: Urology

## 2012-03-11 ENCOUNTER — Other Ambulatory Visit: Payer: 59

## 2012-03-18 ENCOUNTER — Encounter: Payer: Self-pay | Admitting: Internal Medicine

## 2012-03-18 ENCOUNTER — Ambulatory Visit (INDEPENDENT_AMBULATORY_CARE_PROVIDER_SITE_OTHER): Payer: 59 | Admitting: Internal Medicine

## 2012-03-18 VITALS — BP 128/88 | HR 91 | Temp 98.4°F | Ht 67.5 in | Wt 284.0 lb

## 2012-03-18 DIAGNOSIS — Z0000 Encounter for general adult medical examination without abnormal findings: Secondary | ICD-10-CM

## 2012-03-18 MED ORDER — LISINOPRIL-HYDROCHLOROTHIAZIDE 20-12.5 MG PO TABS: 1.0000 | ORAL_TABLET | Freq: Every day | ORAL | Status: DC

## 2012-03-18 MED ORDER — OMEPRAZOLE 20 MG PO CPDR: 20.0000 mg | DELAYED_RELEASE_CAPSULE | Freq: Every day | ORAL | Status: DC

## 2012-03-18 NOTE — Patient Instructions (Addendum)
Work on sleep position.    Losing weight may help some of your sx.   Will continue to monitor  Your blood pressure to make sure .  Below 140/90  Continue tobacco free.  Exercise as much as possible .  ROV  6  Months  Call us if you want a dietitcian nutrition  Referral .     Preventive Care for Adults, Female A healthy lifestyle and preventive care can promote health and wellness. Preventive health guidelines for women include the following key practices.  A routine yearly physical is a good way to check with your caregiver about your health and preventive screening. It is a chance to share any concerns and updates on your health, and to receive a thorough exam.  Visit your dentist for a routine exam and preventive care every 6 months. Brush your teeth twice a day and floss once a day. Good oral hygiene prevents tooth decay and gum disease.  The frequency of eye exams is based on your age, health, family medical history, use of contact lenses, and other factors. Follow your caregiver's recommendations for frequency of eye exams.  Eat a healthy diet. Foods like vegetables, fruits, whole grains, low-fat dairy products, and lean protein foods contain the nutrients you need without too many calories. Decrease your intake of foods high in solid fats, added sugars, and salt. Eat the right amount of calories for you.Get information about a proper diet from your caregiver, if necessary.  Regular physical exercise is one of the most important things you can do for your health. Most adults should get at least 150 minutes of moderate-intensity exercise (any activity that increases your heart rate and causes you to sweat) each week. In addition, most adults need muscle-strengthening exercises on 2 or more days a week.  Maintain a healthy weight. The body mass index (BMI) is a screening tool to identify possible weight problems. It provides an estimate of body fat based on height and weight. Your caregiver  can help determine your BMI, and can help you achieve or maintain a healthy weight.For adults 20 years and older:  A BMI below 18.5 is considered underweight.  A BMI of 18.5 to 24.9 is normal.  A BMI of 25 to 29.9 is considered overweight.  A BMI of 30 and above is considered obese.  Maintain normal blood lipids and cholesterol levels by exercising and minimizing your intake of saturated fat. Eat a balanced diet with plenty of fruit and vegetables. Blood tests for lipids and cholesterol should begin at age 83 and be repeated every 5 years. If your lipid or cholesterol levels are high, you are over 50, or you are at high risk for heart disease, you may need your cholesterol levels checked more frequently.Ongoing high lipid and cholesterol levels should be treated with medicines if diet and exercise are not effective.  If you smoke, find out from your caregiver how to quit. If you do not use tobacco, do not start.  If you are pregnant, do not drink alcohol. If you are breastfeeding, be very cautious about drinking alcohol. If you are not pregnant and choose to drink alcohol, do not exceed 1 drink per day. One drink is considered to be 12 ounces (355 mL) of beer, 5 ounces (148 mL) of wine, or 1.5 ounces (44 mL) of liquor.  Avoid use of street drugs. Do not share needles with anyone. Ask for help if you need support or instructions about stopping the use of drugs.  High blood pressure causes heart disease and increases the risk of stroke. Your blood pressure should be checked at least every 1 to 2 years. Ongoing high blood pressure should be treated with medicines if weight loss and exercise are not effective.  If you are 51 to 45 years old, ask your caregiver if you should take aspirin to prevent strokes.  Diabetes screening involves taking a blood sample to check your fasting blood sugar level. This should be done once every 3 years, after age 64, if you are within normal weight and without  risk factors for diabetes. Testing should be considered at a younger age or be carried out more frequently if you are overweight and have at least 1 risk factor for diabetes.  Breast cancer screening is essential preventive care for women. You should practice "breast self-awareness." This means understanding the normal appearance and feel of your breasts and may include breast self-examination. Any changes detected, no matter how small, should be reported to a caregiver. Women in their 8s and 30s should have a clinical breast exam (CBE) by a caregiver as part of a regular health exam every 1 to 3 years. After age 34, women should have a CBE every year. Starting at age 3, women should consider having a mammography (breast X-ray test) every year. Women who have a family history of breast cancer should talk to their caregiver about genetic screening. Women at a high risk of breast cancer should talk to their caregivers about having magnetic resonance imaging (MRI) and a mammography every year.  The Pap test is a screening test for cervical cancer. A Pap test can show cell changes on the cervix that might become cervical cancer if left untreated. A Pap test is a procedure in which cells are obtained and examined from the lower end of the uterus (cervix).  Women should have a Pap test starting at age 11.  Between ages 26 and 66, Pap tests should be repeated every 2 years.  Beginning at age 44, you should have a Pap test every 3 years as long as the past 3 Pap tests have been normal.  Some women have medical problems that increase the chance of getting cervical cancer. Talk to your caregiver about these problems. It is especially important to talk to your caregiver if a new problem develops soon after your last Pap test. In these cases, your caregiver may recommend more frequent screening and Pap tests.  The above recommendations are the same for women who have or have not gotten the vaccine for human  papillomavirus (HPV).  If you had a hysterectomy for a problem that was not cancer or a condition that could lead to cancer, then you no longer need Pap tests. Even if you no longer need a Pap test, a regular exam is a good idea to make sure no other problems are starting.  If you are between ages 20 and 26, and you have had normal Pap tests going back 10 years, you no longer need Pap tests. Even if you no longer need a Pap test, a regular exam is a good idea to make sure no other problems are starting.  If you have had past treatment for cervical cancer or a condition that could lead to cancer, you need Pap tests and screening for cancer for at least 20 years after your treatment.  If Pap tests have been discontinued, risk factors (such as a new sexual partner) need to be reassessed to determine if  screening should be resumed.  The HPV test is an additional test that may be used for cervical cancer screening. The HPV test looks for the virus that can cause the cell changes on the cervix. The cells collected during the Pap test can be tested for HPV. The HPV test could be used to screen women aged 81 years and older, and should be used in women of any age who have unclear Pap test results. After the age of 29, women should have HPV testing at the same frequency as a Pap test.  Colorectal cancer can be detected and often prevented. Most routine colorectal cancer screening begins at the age of 37 and continues through age 27. However, your caregiver may recommend screening at an earlier age if you have risk factors for colon cancer. On a yearly basis, your caregiver may provide home test kits to check for hidden blood in the stool. Use of a small camera at the end of a tube, to directly examine the colon (sigmoidoscopy or colonoscopy), can detect the earliest forms of colorectal cancer. Talk to your caregiver about this at age 38, when routine screening begins. Direct examination of the colon should be  repeated every 5 to 10 years through age 82, unless early forms of pre-cancerous polyps or small growths are found.  Hepatitis C blood testing is recommended for all people born from [birth year] through 1965 and any individual with known risks for hepatitis C.  Practice safe sex. Use condoms and avoid high-risk sexual practices to reduce the spread of sexually transmitted infections (STIs). STIs include gonorrhea, chlamydia, syphilis, trichomonas, herpes, HPV, and human immunodeficiency virus (HIV). Herpes, HIV, and HPV are viral illnesses that have no cure. They can result in disability, cancer, and death. Sexually active women aged 61 and younger should be checked for chlamydia. Older women with new or multiple partners should also be tested for chlamydia. Testing for other STIs is recommended if you are sexually active and at increased risk.  Osteoporosis is a disease in which the bones lose minerals and strength with aging. This can result in serious bone fractures. The risk of osteoporosis can be identified using a bone density scan. Women ages 41 and over and women at risk for fractures or osteoporosis should discuss screening with their caregivers. Ask your caregiver whether you should take a calcium supplement or vitamin D to reduce the rate of osteoporosis.  Menopause can be associated with physical symptoms and risks. Hormone replacement therapy is available to decrease symptoms and risks. You should talk to your caregiver about whether hormone replacement therapy is right for you.  Use sunscreen with sun protection factor (SPF) of 30 or more. Apply sunscreen liberally and repeatedly throughout the day. You should seek shade when your shadow is shorter than you. Protect yourself by wearing long sleeves, pants, a wide-brimmed hat, and sunglasses year round, whenever you are outdoors.  Once a month, do a whole body skin exam, using a mirror to look at the skin on your back. Notify your caregiver of new  moles, moles that have irregular borders, moles that are larger than a pencil eraser, or moles that have changed in shape or color.  Stay current with required immunizations.  Influenza. You need a dose every fall (or winter). The composition of the flu vaccine changes each year, so being vaccinated once is not enough.  Pneumococcal polysaccharide. You need 1 to 2 doses if you smoke cigarettes or if you have certain chronic medical conditions.  You need 1 dose at age 21 (or older) if you have never been vaccinated.  Tetanus, diphtheria, pertussis (Tdap, Td). Get 1 dose of Tdap vaccine if you are younger than age 34, are over 24 and have contact with an infant, are a Research scientist (physical sciences), are pregnant, or simply want to be protected from whooping cough. After that, you need a Td booster dose every 10 years. Consult your caregiver if you have not had at least 3 tetanus and diphtheria-containing shots sometime in your life or have a deep or dirty wound.  HPV. You need this vaccine if you are a woman age 41 or younger. The vaccine is given in 3 doses over 6 months.  Measles, mumps, rubella (MMR). You need at least 1 dose of MMR if you were born in 1957 or later. You may also need a second dose.  Meningococcal. If you are age 30 to 63 and a first-year college student living in a residence hall, or have one of several medical conditions, you need to get vaccinated against meningococcal disease. You may also need additional booster doses.  Zoster (shingles). If you are age 54 or older, you should get this vaccine.  Varicella (chickenpox). If you have never had chickenpox or you were vaccinated but received only 1 dose, talk to your caregiver to find out if you need this vaccine.  Hepatitis A. You need this vaccine if you have a specific risk factor for hepatitis A virus infection or you simply wish to be protected from this disease. The vaccine is usually given as 2 doses, 6 to 18 months apart.  Hepatitis  B. You need this vaccine if you have a specific risk factor for hepatitis B virus infection or you simply wish to be protected from this disease. The vaccine is given in 3 doses, usually over 6 months. Preventive Services / Frequency Ages 43 to 16  Blood pressure check.** / Every 1 to 2 years.  Lipid and cholesterol check.** / Every 5 years beginning at age 33.  Clinical breast exam.** / Every 3 years for women in their 35s and 30s.  Pap test.** / Every 2 years from ages 41 through 4. Every 3 years starting at age 19 through age 50 or 61 with a history of 3 consecutive normal Pap tests.  HPV screening.** / Every 3 years from ages 67 through ages 72 to 61 with a history of 3 consecutive normal Pap tests.  Hepatitis C blood test.** / For any individual with known risks for hepatitis C.  Skin self-exam. / Monthly.  Influenza immunization.** / Every year.  Pneumococcal polysaccharide immunization.** / 1 to 2 doses if you smoke cigarettes or if you have certain chronic medical conditions.  Tetanus, diphtheria, pertussis (Tdap, Td) immunization. / A one-time dose of Tdap vaccine. After that, you need a Td booster dose every 10 years.  HPV immunization. / 3 doses over 6 months, if you are 80 and younger.  Measles, mumps, rubella (MMR) immunization. / You need at least 1 dose of MMR if you were born in 1957 or later. You may also need a second dose.  Meningococcal immunization. / 1 dose if you are age 50 to 52 and a first-year college student living in a residence hall, or have one of several medical conditions, you need to get vaccinated against meningococcal disease. You may also need additional booster doses.  Varicella immunization.** / Consult your caregiver.  Hepatitis A immunization.** / Consult your caregiver. 2 doses, 6  to 18 months apart.  Hepatitis B immunization.** / Consult your caregiver. 3 doses usually over 6 months. Ages 53 to 33  Blood pressure check.** / Every 1 to 2  years.  Lipid and cholesterol check.** / Every 5 years beginning at age 73.  Clinical breast exam.** / Every year after age 24.  Mammogram.** / Every year beginning at age 67 and continuing for as long as you are in good health. Consult with your caregiver.  Pap test.** / Every 3 years starting at age 72 through age 57 or 10 with a history of 3 consecutive normal Pap tests.  HPV screening.** / Every 3 years from ages 9 through ages 68 to 68 with a history of 3 consecutive normal Pap tests.  Fecal occult blood test (FOBT) of stool. / Every year beginning at age 44 and continuing until age 38. You may not need to do this test if you get a colonoscopy every 10 years.  Flexible sigmoidoscopy or colonoscopy.** / Every 5 years for a flexible sigmoidoscopy or every 10 years for a colonoscopy beginning at age 72 and continuing until age 9.  Hepatitis C blood test.** / For all people born from [birth year] through 1963 and any individual with known risks for hepatitis C.  Skin self-exam. / Monthly.  Influenza immunization.** / Every year.  Pneumococcal polysaccharide immunization.** / 1 to 2 doses if you smoke cigarettes or if you have certain chronic medical conditions.  Tetanus, diphtheria, pertussis (Tdap, Td) immunization.** / A one-time dose of Tdap vaccine. After that, you need a Td booster dose every 10 years.  Measles, mumps, rubella (MMR) immunization. / You need at least 1 dose of MMR if you were born in 1957 or later. You may also need a second dose.  Varicella immunization.** / Consult your caregiver.  Meningococcal immunization.** / Consult your caregiver.  Hepatitis A immunization.** / Consult your caregiver. 2 doses, 6 to 18 months apart.  Hepatitis B immunization.** / Consult your caregiver. 3 doses, usually over 6 months. Ages 60 and over  Blood pressure check.** / Every 1 to 2 years.  Lipid and cholesterol check.** / Every 5 years beginning at age 31.  Clinical breast  exam.** / Every year after age 66.  Mammogram.** / Every year beginning at age 47 and continuing for as long as you are in good health. Consult with your caregiver.  Pap test.** / Every 3 years starting at age 109 through age 40 or 33 with a 3 consecutive normal Pap tests. Testing can be stopped between 65 and 70 with 3 consecutive normal Pap tests and no abnormal Pap or HPV tests in the past 10 years.  HPV screening.** / Every 3 years from ages 66 through ages 73 or 53 with a history of 3 consecutive normal Pap tests. Testing can be stopped between 65 and 70 with 3 consecutive normal Pap tests and no abnormal Pap or HPV tests in the past 10 years.  Fecal occult blood test (FOBT) of stool. / Every year beginning at age 54 and continuing until age 44. You may not need to do this test if you get a colonoscopy every 10 years.  Flexible sigmoidoscopy or colonoscopy.** / Every 5 years for a flexible sigmoidoscopy or every 10 years for a colonoscopy beginning at age 68 and continuing until age 68.  Hepatitis C blood test.** / For all people born from [birth year] through 1963 and any individual with known risks for hepatitis C.  Osteoporosis screening.** /  A one-time screening for women ages 26 and over and women at risk for fractures or osteoporosis.  Skin self-exam. / Monthly.  Influenza immunization.** / Every year.  Pneumococcal polysaccharide immunization.** / 1 dose at age 18 (or older) if you have never been vaccinated.  Tetanus, diphtheria, pertussis (Tdap, Td) immunization. / A one-time dose of Tdap vaccine if you are over 65 and have contact with an infant, are a Research scientist (physical sciences), or simply want to be protected from whooping cough. After that, you need a Td booster dose every 10 years.  Varicella immunization.** / Consult your caregiver.  Meningococcal immunization.** / Consult your caregiver.  Hepatitis A immunization.** / Consult your caregiver. 2 doses, 6 to 18 months apart.  Hepatitis B  immunization.** / Check with your caregiver. 3 doses, usually over 6 months. ** Family history and personal history of risk and conditions may change your caregiver's recommendations. Document Released: 02/27/2001 Document Revised: 03/26/2011 Document Reviewed: 05/29/2010 [facility] Patient Information 2013 [place], [place].

## 2012-03-18 NOTE — Progress Notes (Signed)
Chief Complaint  Patient presents with  . Annual Exam  . Hypertension    HPI: Patient comes in today for Preventive Health Care visit   Since last visit has seen Dr Russella Dar for evaluation of gi issues and planned edg .  rx for bowel overgrowth   Has soreness both lateral flank abd more on left  No trauma .   Still tobacco free for 3 months .Has gained weight with this .  Plans to lose weight with dec carbs etc consider w w   Finishing trial in [place]   Daughters and family membors murder   Guilty sentencing tomorrow  First degree is tired  Took xanax and heart was still pounding   Yesterday .  Doing better coping  To go back to work soon  bp Orland Jarred once pill per day   No cp sob edema  Neon prilosec  For refuls and told to stay on per dr stark for now.  Had uro appt  For recurrent blood in urine without infection     ROS:  GEN/ HEENT: No fever, significant weight changes sweats headaches vision problems hearing changes, CV/ PULM; No chest pain shortness of breath cough, syncope,edema  change in exercise tolerance. GI /GU: No adominal pain, vomiting, change in bowel habits. No blood in the stool. No significant GU symptoms. SKIN/HEME: ,no acute skin rashes suspicious lesions or bleeding. No lymphadenopathy, nodules, masses.  NEURO/ PSYCH:  No neurologic signs such as weakness numbness. No depression anxiety. IMM/ Allergy: No unusual infections.  Allergy .   REST of 12 system review negative except as per HPI   Past Medical History  Diagnosis Date  . HTN (hypertension)   . GERD (gastroesophageal reflux disease)   . Anxiety   . History of gallstones   . Obesity   . TMJ (temporomandibular joint disorder)     Family History  Problem Relation Age of Onset  . Heart disease Mother 27    Died age 18, MI  . Hypertension Father   . Diabetes Father   . Heart disease Father     History   Social History  . Marital Status: Single    Spouse Name: N/A    Number of Children: 1   . Years of Education: N/A   Occupational History  . manager    Social History Main Topics  . Smoking status: Former Smoker -- 0.25 packs/day    Types: Cigarettes    Quit date: 03/16/2011  . Smokeless tobacco: Never Used  . Alcohol Use: No  . Drug Use: No  . Sexually Active: None   Other Topics Concern  . 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.,       Back in school gen ed   At [employer],      [facility] of [facility],     BF and dog,     G3P1,     Bereaved parent daughter murdered 2008,   Trial finished  Claris Gower guilty this week march    No etoh,      No caffeine,     Minimal tobacco,       ETS     Got married  feb 14     Outpatient Encounter Prescriptions as of 03/18/2012  Medication Sig Dispense Refill  . ALPRAZolam (XANAX) 0.25 MG tablet Take 1 tablet (0.25 mg total) by mouth 3 (three) times daily as needed for anxiety.  24 tablet  0  . cyclobenzaprine (FLEXERIL) 10 MG tablet Take 0.5 tablets (5 mg total) by mouth daily as needed. Muscle spasms  30 tablet  2  . fluticasone (FLONASE) 50 MCG/ACT nasal spray       . ibuprofen (ADVIL,MOTRIN) 800 MG tablet TAKE 1 TABLET BY MOUTH EVERY 8 HOURS AS NEEDED FOR CRAMPS  30 tablet  2  . lisinopril-hydrochlorothiazide (PRINZIDE,ZESTORETIC) 20-12.5 MG per tablet Take 1 tablet by mouth daily.  90 tablet  3  . omeprazole (PRILOSEC) 20 MG capsule Take 1 capsule (20 mg total) by mouth daily. For heartburn  90 capsule  1  . rifaximin (XIFAXAN) 550 MG TABS Take 1 tablet (550 mg total) by mouth 2 (two) times daily.  14 tablet  0  . [DISCONTINUED] lisinopril-hydrochlorothiazide (PRINZIDE,ZESTORETIC) 20-12.5 MG per tablet Take 1 tablet by mouth daily.  90 tablet  3  . [DISCONTINUED] omeprazole (PRILOSEC) 20 MG capsule Take 1 capsule (20 mg total) by mouth daily as  needed. For heartburn  90 capsule  0   No facility-administered encounter medications on file as of 03/18/2012.    EXAM:  BP 128/88  Pulse 91  Temp(Src) 98.4 F (36.9 C) (Oral)  Ht 5' 7.5" (1.715 m)  Wt 284 lb (128.822 kg)  BMI 43.8 kg/m2  SpO2 97%  Body mass index is 43.8 kg/(m^2). Wt Readings from Last 3 Encounters:  03/18/12 284 lb (128.822 kg)  03/05/12 278 lb 12.8 oz (126.463 kg)  01/23/12 280 lb (127.007 kg)    Physical Exam: Vital signs reviewed [id number] is a well-developed well-nourished alert cooperative   AA  Sleepy appearing female who appears her stated age in no acute distress.  Looks tired sleep deprived  but well  HEENT: normocephalic atraumatic , Eyes: PERRL EOM's full, conjunctiva clear, Nares: paten,t no deformity discharge or tenderness., Ears: no deformity EAC's clear TMs with normal landmarks. Mouth: clear OP, no lesions, edema.  Moist mucous membranes. Dentition in adequate repair. NECK: supple without masses, thyromegaly or bruits. CHEST/PULM:  Clear to auscultation and percussion breath sounds equal no wheeze , rales or rhonchi. No chest wall deformities or tenderness. Breast: normal by inspection . No dimpling, discharge, masses, tenderness or discharge .  CV: PMI is nondisplaced, S1 S2 no gallops, murmurs, rubs. Peripheral pulses are full without delay.No JVD .  ABDOMEN: Bowel sounds normal nontender  No guard or rebound, no hepato splenomegal no CVA tenderness.  No hernia. Extremtities:  No clubbing cyanosis or edema, no acute joint swelling or redness no focal atrophy NEURO:  Oriented x3, cranial nerves 3-12 appear to be intact, no obvious focal weakness,gait within normal limits no abnormal reflexes or asymmetrical SKIN: No acute rashes normal turgor, color, no bruising or petechiae. PSYCH: Oriented, good eye contact, no obvious depression anxiety, cognition and judgment appear normal. LN: no cervical axillary inguinal adenopathy  Lab Results   Component Value Date   WBC 6.3 01/23/2012   HGB 13.8 01/23/2012   HCT 41.4 01/23/2012   PLT 288.0 01/23/2012   GLUCOSE 89 01/23/2012   CHOL 183 01/23/2012   TRIG 126.0 01/23/2012   HDL 32.80* 01/23/2012   LDLCALC 125* 01/23/2012   ALT 31 01/23/2012   AST 21 01/23/2012   NA 137 01/23/2012  K 3.9 01/23/2012   CL 102 01/23/2012   CREATININE 0.7 01/23/2012   BUN 11 01/23/2012   CO2 30 01/23/2012   TSH 0.63 01/23/2012    ASSESSMENT AND PLAN:  Discussed the following assessment and plan:  Visit for preventive health examination  HYPERTENSION - monitor readings has been good  same med for now    Heartburn  H/O bee sting allergy  GERD (gastroesophageal reflux disease) - edg pending  Chest discomfort - lateral almost seems like cw p  etc   OBESITY  Stress  Patient Care Team: Madelin Headings, MD as PCP - General Loney Laurence, MD (Obstetrics and Gynecology) Meryl Dare, MD as Attending Physician (Gastroenterology) Patient Instructions  Work on sleep position.    Losing weight may help some of your sx.   Will continue to monitor  Your blood pressure to make sure .  Below 140/90  Continue tobacco free.  Exercise as much as possible .  ROV  6  Months  Call us if you want a dietitcian nutrition  Referral .     Preventive Care for Adults, Female A healthy lifestyle and preventive care can promote health and wellness. Preventive health guidelines for women include the following key practices.  A routine yearly physical is a good way to check with your caregiver about your health and preventive screening. It is a chance to share any concerns and updates on your health, and to receive a thorough exam.  Visit your dentist for a routine exam and preventive care every 6 months. Brush your teeth twice a day and floss once a day. Good oral hygiene prevents tooth decay and gum disease.  The frequency of eye exams is based on your age, health, family medical history, use of contact lenses, and other  factors. Follow your caregiver's recommendations for frequency of eye exams.  Eat a healthy diet. Foods like vegetables, fruits, whole grains, low-fat dairy products, and lean protein foods contain the nutrients you need without too many calories. Decrease your intake of foods high in solid fats, added sugars, and salt. Eat the right amount of calories for you.Get information about a proper diet from your caregiver, if necessary.  Regular physical exercise is one of the most important things you can do for your health. Most adults should get at least 150 minutes of moderate-intensity exercise (any activity that increases your heart rate and causes you to sweat) each week. In addition, most adults need muscle-strengthening exercises on 2 or more days a week.  Maintain a healthy weight. The body mass index (BMI) is a screening tool to identify possible weight problems. It provides an estimate of body fat based on height and weight. Your caregiver can help determine your BMI, and can help you achieve or maintain a healthy weight.For adults 20 years and older:  A BMI below 18.5 is considered underweight.  A BMI of 18.5 to 24.9 is normal.  A BMI of 25 to 29.9 is considered overweight.  A BMI of 30 and above is considered obese.  Maintain normal blood lipids and cholesterol levels by exercising and minimizing your intake of saturated fat. Eat a balanced diet with plenty of fruit and vegetables. Blood tests for lipids and cholesterol should begin at age 67 and be repeated every 5 years. If your lipid or cholesterol levels are high, you are over 50, or you are at high risk for heart disease, you may need your cholesterol levels checked more frequently.Ongoing high lipid  and cholesterol levels should be treated with medicines if diet and exercise are not effective.  If you smoke, find out from your caregiver how to quit. If you do not use tobacco, do not start.  If you are pregnant, do not drink  alcohol. If you are breastfeeding, be very cautious about drinking alcohol. If you are not pregnant and choose to drink alcohol, do not exceed 1 drink per day. One drink is considered to be 12 ounces (355 mL) of beer, 5 ounces (148 mL) of wine, or 1.5 ounces (44 mL) of liquor.  Avoid use of street drugs. Do not share needles with anyone. Ask for help if you need support or instructions about stopping the use of drugs.  High blood pressure causes heart disease and increases the risk of stroke. Your blood pressure should be checked at least every 1 to 2 years. Ongoing high blood pressure should be treated with medicines if weight loss and exercise are not effective.  If you are 79 to 45 years old, ask your caregiver if you should take aspirin to prevent strokes.  Diabetes screening involves taking a blood sample to check your fasting blood sugar level. This should be done once every 3 years, after age 80, if you are within normal weight and without risk factors for diabetes. Testing should be considered at a younger age or be carried out more frequently if you are overweight and have at least 1 risk factor for diabetes.  Breast cancer screening is essential preventive care for women. You should practice "breast self-awareness." This means understanding the normal appearance and feel of your breasts and may include breast self-examination. Any changes detected, no matter how small, should be reported to a caregiver. Women in their 53s and 30s should have a clinical breast exam (CBE) by a caregiver as part of a regular health exam every 1 to 3 years. After age 57, women should have a CBE every year. Starting at age 65, women should consider having a mammography (breast X-ray test) every year. Women who have a family history of breast cancer should talk to their caregiver about genetic screening. Women at a high risk of breast cancer should talk to their caregivers about having magnetic resonance imaging (MRI)  and a mammography every year.  The Pap test is a screening test for cervical cancer. A Pap test can show cell changes on the cervix that might become cervical cancer if left untreated. A Pap test is a procedure in which cells are obtained and examined from the lower end of the uterus (cervix).  Women should have a Pap test starting at age 61.  Between ages 57 and 9, Pap tests should be repeated every 2 years.  Beginning at age 16, you should have a Pap test every 3 years as long as the past 3 Pap tests have been normal.  Some women have medical problems that increase the chance of getting cervical cancer. Talk to your caregiver about these problems. It is especially important to talk to your caregiver if a new problem develops soon after your last Pap test. In these cases, your caregiver may recommend more frequent screening and Pap tests.  The above recommendations are the same for women who have or have not gotten the vaccine for human papillomavirus (HPV).  If you had a hysterectomy for a problem that was not cancer or a condition that could lead to cancer, then you no longer need Pap tests. Even if you no longer  need a Pap test, a regular exam is a good idea to make sure no other problems are starting.  If you are between ages 3 and 4, and you have had normal Pap tests going back 10 years, you no longer need Pap tests. Even if you no longer need a Pap test, a regular exam is a good idea to make sure no other problems are starting.  If you have had past treatment for cervical cancer or a condition that could lead to cancer, you need Pap tests and screening for cancer for at least 20 years after your treatment.  If Pap tests have been discontinued, risk factors (such as a new sexual partner) need to be reassessed to determine if screening should be resumed.  The HPV test is an additional test that may be used for cervical cancer screening. The HPV test looks for the virus that can cause  the cell changes on the cervix. The cells collected during the Pap test can be tested for HPV. The HPV test could be used to screen women aged 80 years and older, and should be used in women of any age who have unclear Pap test results. After the age of 55, women should have HPV testing at the same frequency as a Pap test.  Colorectal cancer can be detected and often prevented. Most routine colorectal cancer screening begins at the age of 42 and continues through age 4. However, your caregiver may recommend screening at an earlier age if you have risk factors for colon cancer. On a yearly basis, your caregiver may provide home test kits to check for hidden blood in the stool. Use of a small camera at the end of a tube, to directly examine the colon (sigmoidoscopy or colonoscopy), can detect the earliest forms of colorectal cancer. Talk to your caregiver about this at age 32, when routine screening begins. Direct examination of the colon should be repeated every 5 to 10 years through age 9, unless early forms of pre-cancerous polyps or small growths are found.  Hepatitis C blood testing is recommended for all people born from [birth year] through 1965 and any individual with known risks for hepatitis C.  Practice safe sex. Use condoms and avoid high-risk sexual practices to reduce the spread of sexually transmitted infections (STIs). STIs include gonorrhea, chlamydia, syphilis, trichomonas, herpes, HPV, and human immunodeficiency virus (HIV). Herpes, HIV, and HPV are viral illnesses that have no cure. They can result in disability, cancer, and death. Sexually active women aged 69 and younger should be checked for chlamydia. Older women with new or multiple partners should also be tested for chlamydia. Testing for other STIs is recommended if you are sexually active and at increased risk.  Osteoporosis is a disease in which the bones lose minerals and strength with aging. This can result in serious bone fractures.  The risk of osteoporosis can be identified using a bone density scan. Women ages 2 and over and women at risk for fractures or osteoporosis should discuss screening with their caregivers. Ask your caregiver whether you should take a calcium supplement or vitamin D to reduce the rate of osteoporosis.  Menopause can be associated with physical symptoms and risks. Hormone replacement therapy is available to decrease symptoms and risks. You should talk to your caregiver about whether hormone replacement therapy is right for you.  Use sunscreen with sun protection factor (SPF) of 30 or more. Apply sunscreen liberally and repeatedly throughout the day. You should seek shade when your  shadow is shorter than you. Protect yourself by wearing long sleeves, pants, a wide-brimmed hat, and sunglasses year round, whenever you are outdoors.  Once a month, do a whole body skin exam, using a mirror to look at the skin on your back. Notify your caregiver of new moles, moles that have irregular borders, moles that are larger than a pencil eraser, or moles that have changed in shape or color.  Stay current with required immunizations.  Influenza. You need a dose every fall (or winter). The composition of the flu vaccine changes each year, so being vaccinated once is not enough.  Pneumococcal polysaccharide. You need 1 to 2 doses if you smoke cigarettes or if you have certain chronic medical conditions. You need 1 dose at age 3 (or older) if you have never been vaccinated.  Tetanus, diphtheria, pertussis (Tdap, Td). Get 1 dose of Tdap vaccine if you are younger than age 35, are over 53 and have contact with an infant, are a Research scientist (physical sciences), are pregnant, or simply want to be protected from whooping cough. After that, you need a Td booster dose every 10 years. Consult your caregiver if you have not had at least 3 tetanus and diphtheria-containing shots sometime in your life or have a deep or dirty wound.  HPV. You  need this vaccine if you are a woman age 49 or younger. The vaccine is given in 3 doses over 6 months.  Measles, mumps, rubella (MMR). You need at least 1 dose of MMR if you were born in 1957 or later. You may also need a second dose.  Meningococcal. If you are age 68 to 14 and a first-year college student living in a residence hall, or have one of several medical conditions, you need to get vaccinated against meningococcal disease. You may also need additional booster doses.  Zoster (shingles). If you are age 12 or older, you should get this vaccine.  Varicella (chickenpox). If you have never had chickenpox or you were vaccinated but received only 1 dose, talk to your caregiver to find out if you need this vaccine.  Hepatitis A. You need this vaccine if you have a specific risk factor for hepatitis A virus infection or you simply wish to be protected from this disease. The vaccine is usually given as 2 doses, 6 to 18 months apart.  Hepatitis B. You need this vaccine if you have a specific risk factor for hepatitis B virus infection or you simply wish to be protected from this disease. The vaccine is given in 3 doses, usually over 6 months. Preventive Services / Frequency Ages 12 to 60  Blood pressure check.** / Every 1 to 2 years.  Lipid and cholesterol check.** / Every 5 years beginning at age 45.  Clinical breast exam.** / Every 3 years for women in their 24s and 30s.  Pap test.** / Every 2 years from ages 39 through 61. Every 3 years starting at age 94 through age 71 or 69 with a history of 3 consecutive normal Pap tests.  HPV screening.** / Every 3 years from ages 57 through ages 9 to 15 with a history of 3 consecutive normal Pap tests.  Hepatitis C blood test.** / For any individual with known risks for hepatitis C.  Skin self-exam. / Monthly.  Influenza immunization.** / Every year.  Pneumococcal polysaccharide immunization.** / 1 to 2 doses if you smoke cigarettes or if you  have certain chronic medical conditions.  Tetanus, diphtheria, pertussis (Tdap, Td) immunization. /  A one-time dose of Tdap vaccine. After that, you need a Td booster dose every 10 years.  HPV immunization. / 3 doses over 6 months, if you are 44 and younger.  Measles, mumps, rubella (MMR) immunization. / You need at least 1 dose of MMR if you were born in 1957 or later. You may also need a second dose.  Meningococcal immunization. / 1 dose if you are age 69 to 43 and a first-year college student living in a residence hall, or have one of several medical conditions, you need to get vaccinated against meningococcal disease. You may also need additional booster doses.  Varicella immunization.** / Consult your caregiver.  Hepatitis A immunization.** / Consult your caregiver. 2 doses, 6 to 18 months apart.  Hepatitis B immunization.** / Consult your caregiver. 3 doses usually over 6 months. Ages 67 to 4  Blood pressure check.** / Every 1 to 2 years.  Lipid and cholesterol check.** / Every 5 years beginning at age 59.  Clinical breast exam.** / Every year after age 55.  Mammogram.** / Every year beginning at age 31 and continuing for as long as you are in good health. Consult with your caregiver.  Pap test.** / Every 3 years starting at age 61 through age 62 or 48 with a history of 3 consecutive normal Pap tests.  HPV screening.** / Every 3 years from ages 75 through ages 40 to 59 with a history of 3 consecutive normal Pap tests.  Fecal occult blood test (FOBT) of stool. / Every year beginning at age 55 and continuing until age 25. You may not need to do this test if you get a colonoscopy every 10 years.  Flexible sigmoidoscopy or colonoscopy.** / Every 5 years for a flexible sigmoidoscopy or every 10 years for a colonoscopy beginning at age 27 and continuing until age 87.  Hepatitis C blood test.** / For all people born from [birth year] through 1963 and any individual with known risks for  hepatitis C.  Skin self-exam. / Monthly.  Influenza immunization.** / Every year.  Pneumococcal polysaccharide immunization.** / 1 to 2 doses if you smoke cigarettes or if you have certain chronic medical conditions.  Tetanus, diphtheria, pertussis (Tdap, Td) immunization.** / A one-time dose of Tdap vaccine. After that, you need a Td booster dose every 10 years.  Measles, mumps, rubella (MMR) immunization. / You need at least 1 dose of MMR if you were born in 1957 or later. You may also need a second dose.  Varicella immunization.** / Consult your caregiver.  Meningococcal immunization.** / Consult your caregiver.  Hepatitis A immunization.** / Consult your caregiver. 2 doses, 6 to 18 months apart.  Hepatitis B immunization.** / Consult your caregiver. 3 doses, usually over 6 months. Ages 69 and over  Blood pressure check.** / Every 1 to 2 years.  Lipid and cholesterol check.** / Every 5 years beginning at age 15.  Clinical breast exam.** / Every year after age 36.  Mammogram.** / Every year beginning at age 64 and continuing for as long as you are in good health. Consult with your caregiver.  Pap test.** / Every 3 years starting at age 31 through age 26 or 84 with a 3 consecutive normal Pap tests. Testing can be stopped between 65 and 70 with 3 consecutive normal Pap tests and no abnormal Pap or HPV tests in the past 10 years.  HPV screening.** / Every 3 years from ages 32 through ages 37 or 62 with a history  of 3 consecutive normal Pap tests. Testing can be stopped between 65 and 70 with 3 consecutive normal Pap tests and no abnormal Pap or HPV tests in the past 10 years.  Fecal occult blood test (FOBT) of stool. / Every year beginning at age 33 and continuing until age 84. You may not need to do this test if you get a colonoscopy every 10 years.  Flexible sigmoidoscopy or colonoscopy.** / Every 5 years for a flexible sigmoidoscopy or every 10 years for a colonoscopy beginning  at age 32 and continuing until age 61.  Hepatitis C blood test.** / For all people born from [birth year] through 1963 and any individual with known risks for hepatitis C.  Osteoporosis screening.** / A one-time screening for women ages 55 and over and women at risk for fractures or osteoporosis.  Skin self-exam. / Monthly.  Influenza immunization.** / Every year.  Pneumococcal polysaccharide immunization.** / 1 dose at age 51 (or older) if you have never been vaccinated.  Tetanus, diphtheria, pertussis (Tdap, Td) immunization. / A one-time dose of Tdap vaccine if you are over 65 and have contact with an infant, are a Research scientist (physical sciences), or simply want to be protected from whooping cough. After that, you need a Td booster dose every 10 years.  Varicella immunization.** / Consult your caregiver.  Meningococcal immunization.** / Consult your caregiver.  Hepatitis A immunization.** / Consult your caregiver. 2 doses, 6 to 18 months apart.  Hepatitis B immunization.** / Check with your caregiver. 3 doses, usually over 6 months. ** Family history and personal history of risk and conditions may change your caregiver's recommendations. Document Released: 02/27/2001 Document Revised: 03/26/2011 Document Reviewed: 05/29/2010 [facility] Patient Information 2013 [place], [place].       [name]. Panosh M.D. Health Maintenance  Topic Date Due  . Influenza Vaccine  09/15/2012  . Pap Smear  03/12/2014  . Tetanus/tdap  05/16/2019   Health Maintenance Review }

## 2012-03-25 ENCOUNTER — Encounter: Payer: Self-pay | Admitting: Gastroenterology

## 2012-03-25 ENCOUNTER — Ambulatory Visit (AMBULATORY_SURGERY_CENTER): Payer: 59 | Admitting: Gastroenterology

## 2012-03-25 VITALS — BP 142/86 | HR 63 | Temp 98.1°F | Resp 12 | Ht 67.0 in | Wt 278.0 lb

## 2012-03-25 DIAGNOSIS — R109 Unspecified abdominal pain: Secondary | ICD-10-CM

## 2012-03-25 DIAGNOSIS — R141 Gas pain: Secondary | ICD-10-CM

## 2012-03-25 DIAGNOSIS — K219 Gastro-esophageal reflux disease without esophagitis: Secondary | ICD-10-CM

## 2012-03-25 MED ORDER — SODIUM CHLORIDE 0.9 % IV SOLN: 500.0000 mL | INTRAVENOUS | Status: DC

## 2012-03-25 NOTE — Progress Notes (Signed)
Patient did not experience any of the following events: a burn prior to discharge; a fall within the facility; wrong site/side/patient/procedure/implant event; or a hospital transfer or hospital admission upon discharge from the facility. (G8907) Patient did not have preoperative order for IV antibiotic SSI prophylaxis. (G8918)  

## 2012-03-25 NOTE — Op Note (Signed)
[facility] [address] [place], [address]   ENDOSCOPY PROCEDURE REPORT  PATIENT: Beth Wallace, Beth Wallace  MR#: [id number] BIRTHDATE: [birth year] , 46  yrs. old GENDER: Female ENDOSCOPIST: Meryl Dare, MD, [facility] PROCEDURE DATE:  03/25/2012 PROCEDURE:  EGD, diagnostic ASA CLASS:     Class II INDICATIONS:  abdominal pain in upper left quadrant.   History of esophageal reflux. MEDICATIONS: MAC sedation, administered by CRNA and propofol (Diprivan) 150mg  IV TOPICAL ANESTHETIC: none DESCRIPTION OF PROCEDURE: After the risks benefits and alternatives of the procedure were thoroughly explained, informed consent was obtained.  The LB GIF-H180 [id number] endoscope was introduced through the mouth and advanced to the second portion of the duodenum without limitations.  The instrument was slowly withdrawn as the mucosa was fully examined.  ESOPHAGUS: The mucosa of the esophagus appeared normal. STOMACH: The mucosa and folds of the stomach appeared normal. DUODENUM: The duodenal mucosa showed no abnormalities in the bulb and second portion of the duodenum.  Retroflexed views revealed a small hiatal hernia.   The scope was then withdrawn from the patient and the procedure completed.  COMPLICATIONS: There were no complications.  ENDOSCOPIC IMPRESSION: 1.   Small hiatal hernia, otherwise the EGD appeared normal  RECOMMENDATIONS: 1.  Anti-reflux regimen 2.  Continue PPI    eSigned:  Meryl Dare, MD, [facility] 03/25/2012 1:28 PM

## 2012-03-25 NOTE — Progress Notes (Signed)
Report to pacu rn, vss, bbs=clear 

## 2012-03-25 NOTE — Patient Instructions (Addendum)
Discharge instructions given with verbal understanding. Handout on a hiatal hernia given. Resume previous medications. YOU HAD AN ENDOSCOPIC PROCEDURE TODAY AT THE [facility]: Refer to the procedure report that was given to you for any specific questions about what was found during the examination.  If the procedure report does not answer your questions, please call your gastroenterologist to clarify.  If you requested that your care partner not be given the details of your procedure findings, then the procedure report has been included in a sealed envelope for you to review at your convenience later.  YOU SHOULD EXPECT: Some feelings of bloating in the abdomen. Passage of more gas than usual.  Walking can help get rid of the air that was put into your GI tract during the procedure and reduce the bloating. If you had a lower endoscopy (such as a colonoscopy or flexible sigmoidoscopy) you may notice spotting of blood in your stool or on the toilet paper. If you underwent a bowel prep for your procedure, then you may not have a normal bowel movement for a few days.  DIET: Your first meal following the procedure should be a light meal and then it is ok to progress to your normal diet.  A half-sandwich or bowl of soup is an example of a good first meal.  Heavy or fried foods are harder to digest and may make you feel nauseous or bloated.  Likewise meals heavy in dairy and vegetables can cause extra gas to form and this can also increase the bloating.  Drink plenty of fluids but you should avoid alcoholic beverages for 24 hours.  ACTIVITY: Your care partner should take you home directly after the procedure.  You should plan to take it easy, moving slowly for the rest of the day.  You can resume normal activity the day after the procedure however you should NOT DRIVE or use heavy machinery for 24 hours (because of the sedation medicines used during the test).    SYMPTOMS TO REPORT IMMEDIATELY: A  gastroenterologist can be reached at any hour.  During normal business hours, 8:30 AM to 5:00 PM Monday through Friday, call [phone].  After hours and on weekends, please call the GI answering service at [phone] who will take a message and have the physician on call contact you.   Following upper endoscopy (EGD)  Vomiting of blood or coffee ground material  New chest pain or pain under the shoulder blades  Painful or persistently difficult swallowing  New shortness of breath  Fever of 100F or higher  Black, tarry-looking stools  FOLLOW UP: If any biopsies were taken you will be contacted by phone or by letter within the next 1-3 weeks.  Call your gastroenterologist if you have not heard about the biopsies in 3 weeks.  Our staff will call the home number listed on your records the next business day following your procedure to check on you and address any questions or concerns that you may have at that time regarding the information given to you following your procedure. This is a courtesy call and so if there is no answer at the home number and we have not heard from you through the emergency physician on call, we will assume that you have returned to your regular daily activities without incident.  SIGNATURES/CONFIDENTIALITY: You and/or your care partner have signed paperwork which will be entered into your electronic medical record.  These signatures attest to the fact that that the information   above on your After Visit Summary has been reviewed and is understood.  Full responsibility of the confidentiality of this discharge information lies with you and/or your care-partner.  

## 2012-03-26 ENCOUNTER — Telehealth: Payer: Self-pay

## 2012-03-26 NOTE — Telephone Encounter (Signed)
Left message on answering machine. 

## 2012-04-04 ENCOUNTER — Ambulatory Visit (INDEPENDENT_AMBULATORY_CARE_PROVIDER_SITE_OTHER): Payer: 59 | Admitting: Urology

## 2012-04-04 DIAGNOSIS — R3129 Other microscopic hematuria: Secondary | ICD-10-CM

## 2012-05-19 ENCOUNTER — Telehealth: Payer: Self-pay | Admitting: Internal Medicine

## 2012-05-19 NOTE — Telephone Encounter (Signed)
Pt has been take omeprazole 20 mg twice a day instead of once a day. Please [facility] [place] [phone]

## 2012-05-19 NOTE — Telephone Encounter (Signed)
Please have dr Russella Dar decide on this medication dosage just had endo  with him in march.  Send request to him as she was last seen by him in march

## 2012-05-19 NOTE — Telephone Encounter (Signed)
Okay to change to bid?

## 2012-05-20 ENCOUNTER — Other Ambulatory Visit: Payer: Self-pay | Admitting: Family Medicine

## 2012-05-20 NOTE — Telephone Encounter (Signed)
I think that gi doc should manage the ppi because of her situation .  And use of increase dosing.  i will send flag to gi about this

## 2012-05-20 NOTE — Telephone Encounter (Signed)
Beth Wallace, I don't recall what I said butI didn't put it in my notes. However if PPI BID on occasion helps her symptoms we will prescribe for her. [name], please change omeprazole to 20 mg po bid for 1 year. She can take qam if that is adequate.

## 2012-05-20 NOTE — Telephone Encounter (Signed)
I spoke to the pt.  She said Dr. Ailene Ravel said it was fine to take 2 daily prn.  Pt states she does not do this everyday.  I viewed Dr. Darcella Gasman note and do not see this mentioned.  Do you want to prescribe?

## 2012-05-21 MED ORDER — OMEPRAZOLE 20 MG PO CPDR: 20.0000 mg | DELAYED_RELEASE_CAPSULE | Freq: Two times a day (BID) | ORAL | Status: DC

## 2012-05-21 NOTE — Addendum Note (Signed)
Addended by: Annett Fabian on: 05/21/2012 09:33 AM   Modules accepted: Orders

## 2012-05-21 NOTE — Telephone Encounter (Signed)
I have left a detailed message with the new rx and directions.  She is asked to call for any questions or concerns

## 2012-06-12 ENCOUNTER — Ambulatory Visit: Payer: 59 | Admitting: Internal Medicine

## 2012-06-15 LAB — HM MAMMOGRAPHY: HM Mammogram: NORMAL

## 2012-07-07 ENCOUNTER — Other Ambulatory Visit: Payer: Self-pay | Admitting: Internal Medicine

## 2012-07-08 ENCOUNTER — Ambulatory Visit (INDEPENDENT_AMBULATORY_CARE_PROVIDER_SITE_OTHER): Payer: 59 | Admitting: Internal Medicine

## 2012-07-08 ENCOUNTER — Encounter: Payer: Self-pay | Admitting: Internal Medicine

## 2012-07-08 VITALS — BP 136/86 | HR 91 | Temp 98.1°F

## 2012-07-08 DIAGNOSIS — M549 Dorsalgia, unspecified: Secondary | ICD-10-CM | POA: Insufficient documentation

## 2012-07-08 DIAGNOSIS — M5430 Sciatica, unspecified side: Secondary | ICD-10-CM | POA: Insufficient documentation

## 2012-07-08 DIAGNOSIS — M5432 Sciatica, left side: Secondary | ICD-10-CM

## 2012-07-08 DIAGNOSIS — I10 Essential (primary) hypertension: Secondary | ICD-10-CM

## 2012-07-08 MED ORDER — LISINOPRIL-HYDROCHLOROTHIAZIDE 20-12.5 MG PO TABS: 1.0000 | ORAL_TABLET | Freq: Every day | ORAL | Status: DC

## 2012-07-08 MED ORDER — IBUPROFEN 800 MG PO TABS: ORAL_TABLET | ORAL | Status: DC

## 2012-07-08 MED ORDER — PREDNISONE 10 MG PO TABS: ORAL_TABLET | ORAL | Status: DC

## 2012-07-08 MED ORDER — KETOROLAC TROMETHAMINE 60 MG/2ML IM SOLN: 60.0000 mg | Freq: Once | INTRAMUSCULAR | Status: DC

## 2012-07-08 NOTE — Patient Instructions (Addendum)
Take ibuprofen 800 every 8 hours   Ice   Can doa prednisone rx if continuing  And  We can refer to specialistand or  physical therapy  if not getting better  When youo get back .   Sciatica Sciatica is pain, weakness, numbness, or tingling along the path of the sciatic nerve. The nerve starts in the lower back and runs down the back of each leg. The nerve controls the muscles in the lower leg and in the back of the knee, while also providing sensation to the back of the thigh, lower leg, and the sole of your foot. Sciatica is a symptom of another medical condition. For instance, nerve damage or certain conditions, such as a herniated disk or bone spur on the spine, pinch or put pressure on the sciatic nerve. This causes the pain, weakness, or other sensations normally associated with sciatica. Generally, sciatica only affects one side of the body. CAUSES   Herniated or slipped disc.  Degenerative disk disease.  A pain disorder involving the narrow muscle in the buttocks (piriformis syndrome).  Pelvic injury or fracture.  Pregnancy.  Tumor (rare). SYMPTOMS  Symptoms can vary from mild to very severe. The symptoms usually travel from the low back to the buttocks and down the back of the leg. Symptoms can include:  Mild tingling or dull aches in the lower back, leg, or hip.  Numbness in the back of the calf or sole of the foot.  Burning sensations in the lower back, leg, or hip.  Sharp pains in the lower back, leg, or hip.  Leg weakness.  Severe back pain inhibiting movement. These symptoms may get worse with coughing, sneezing, laughing, or prolonged sitting or standing. Also, being overweight may worsen symptoms. DIAGNOSIS  Your caregiver will perform a physical exam to look for common symptoms of sciatica. He or she may ask you to do certain movements or activities that would trigger sciatic nerve pain. Other tests may be performed to find the cause of the sciatica. These may  include:  Blood tests.  X-rays.  Imaging tests, such as an MRI or CT scan. TREATMENT  Treatment is directed at the cause of the sciatic pain. Sometimes, treatment is not necessary and the pain and discomfort goes away on its own. If treatment is needed, your caregiver may suggest:  Over-the-counter medicines to relieve pain.  Prescription medicines, such as anti-inflammatory medicine, muscle relaxants, or narcotics.  Applying heat or ice to the painful area.  Steroid injections to lessen pain, irritation, and inflammation around the nerve.  Reducing activity during periods of pain.  Exercising and stretching to strengthen your abdomen and improve flexibility of your spine. Your caregiver may suggest losing weight if the extra weight makes the back pain worse.  Physical therapy.  Surgery to eliminate what is pressing or pinching the nerve, such as a bone spur or part of a herniated disk. HOME CARE INSTRUCTIONS   Only take over-the-counter or prescription medicines for pain or discomfort as directed by your caregiver.  Apply ice to the affected area for 20 minutes, 3 4 times a day for the first 48 72 hours. Then try heat in the same way.  Exercise, stretch, or perform your usual activities if these do not aggravate your pain.  Attend physical therapy sessions as directed by your caregiver.  Keep all follow-up appointments as directed by your caregiver.  Do not wear high heels or shoes that do not provide proper support.  Check your  mattress to see if it is too soft. A firm mattress may lessen your pain and discomfort. SEEK IMMEDIATE MEDICAL CARE IF:   You lose control of your bowel or bladder (incontinence).  You have increasing weakness in the lower back, pelvis, buttocks, or legs.  You have redness or swelling of your back.  You have a burning sensation when you urinate.  You have pain that gets worse when you lie down or awakens you at night.  Your pain is worse  than you have experienced in the past.  Your pain is lasting longer than 4 weeks.  You are suddenly losing weight without reason. MAKE SURE YOU:  Understand these instructions.  Will watch your condition.  Will get help right away if you are not doing well or get worse. Document Released: 12/26/2000 Document Revised: 07/03/2011 Document Reviewed: 05/13/2011 [facility] Patient Information 2014 [place], [place].

## 2012-07-08 NOTE — Progress Notes (Signed)
Chief Complaint  Patient presents with  . Back Pain    Has had sciatic problems in the past.    HPI: Patient comes in today for SDA for  new problem evaluation. Recently married and had a reception 5 days ago  and was very busy then got left buttock pain to leg and foot   "sciatica" tha has been very painful to sit   . Radiates down back of left leg  . Hard to lift leg from pain  No weakness.Took 1 ibuprofen and no help with heat  .  Remote hx of same and seen at urgent care and had pt cause was job related   And eventually got better.     This time  Lasting longer and quite painful muscle relaxant no help.   To travel to [place] on vacation in car .   Needs refill BP meds   Seems controlled  Nose of meds   runnign in 130/80 range   ROS: See pertinent positives and negatives per HPI. No cv sx and no numbness no falling  Past Medical History  Diagnosis Date  . HTN (hypertension)   . GERD (gastroesophageal reflux disease)   . Anxiety   . History of gallstones   . Obesity   . TMJ (temporomandibular joint disorder)     Family History  Problem Relation Age of Onset  . Heart disease Mother 42    Died age 10, MI  . Hypertension Father   . Diabetes Father   . Heart disease Father     History   Social History  . Marital Status: Single    Spouse Name: N/A    Number of Children: 1  . Years of Education: N/A   Occupational History  . manager    Social History Main Topics  . Smoking status: Former Smoker -- 0.25 packs/day    Types: Cigarettes    Quit date: 01/22/2012  . Smokeless tobacco: Never Used  . Alcohol Use: No  . Drug Use: No  . Sexually Active: None   Other Topics Concern  . 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 fluticasone (FLONASE) 50 MCG/ACT nasal spray       . ibuprofen (ADVIL,MOTRIN) 800 MG tablet TAKE 1 TABLET BY MOUTH EVERY 8 HOURS AS NEEDED FOR CRAMPS  30 tablet  5  . lisinopril-hydrochlorothiazide (PRINZIDE,ZESTORETIC) 20-12.5 MG per tablet Take 1 tablet by mouth daily.  90 tablet  1  . omeprazole (PRILOSEC) 20 MG capsule Take 1 capsule (20 mg total) by mouth 2 (two) times daily. For heartburn  60 capsule  11  . [DISCONTINUED] ibuprofen (ADVIL,MOTRIN) 800 MG tablet TAKE 1 TABLET BY MOUTH EVERY 8 HOURS AS NEEDED FOR CRAMPS  30 tablet  2  . [DISCONTINUED] lisinopril-hydrochlorothiazide (PRINZIDE,ZESTORETIC) 20-12.5 MG per tablet Take 1 tablet by mouth daily.  90 tablet  3  . [DISCONTINUED] rifaximin (XIFAXAN) 550 MG TABS Take 1 tablet (550 mg total) by mouth 2 (two) times daily.  14 tablet  0  . cyclobenzaprine (FLEXERIL) 10 MG tablet Take 0.5 tablets (5 mg total) by mouth daily as needed. Muscle spasms  30 tablet  2  . predniSONE (DELTASONE) 10 MG tablet Take pills per day,6,6,6,4,4,4,2,2,2,1,1,1  40 tablet  0  . [DISCONTINUED] ALPRAZolam (XANAX) 0.25 MG tablet Take 1 tablet (0.25 mg total) by mouth 3 (three) times daily as needed for anxiety.  24 tablet  0   Facility-Administered Encounter Medications as of 07/08/2012  Medication Dose Route Frequency Provider Last Rate Last Dose  . ketorolac (TORADOL) injection 60 mg  60 mg Intramuscular Once Madelin Headings, MD        EXAM:  BP 136/86  Pulse 91  Temp(Src) 98.1 F (36.7 C) (Oral)  SpO2 96%  Body mass index is 0.00 kg/(m^2).  GENERAL: vitals reviewed and listed above, alert, oriented, appears well hydrated and in no mild to mod  distress  Better with standing  Sits back on table to examine e HEENT: atraumatic, conjunctiva  clear, no obvious abnormalities on inspection of external nose and ears NECK: no obvious masses on inspection palpation CV: HRRR, no clubbing cyanosis Back tender righ buttock no rash toe heel elevation  Intact  dtrs intact   Guarded  Pos? slr left  MS: moves all extremities without noticeable focal  abnormality PSYCH: pleasant and cooperative, no obvious depression or anxiety Lab Results  Component Value Date   WBC 6.3 01/23/2012   HGB 13.8 01/23/2012   HCT 41.4 01/23/2012   PLT 288.0 01/23/2012   GLUCOSE 89 01/23/2012   CHOL 183 01/23/2012   TRIG 126.0 01/23/2012   HDL 32.80* 01/23/2012   LDLCALC 125* 01/23/2012   ALT 31 01/23/2012   AST 21 01/23/2012   NA 137 01/23/2012   K 3.9 01/23/2012   CL 102 01/23/2012   CREATININE 0.7 01/23/2012   BUN 11 01/23/2012   CO2 30 01/23/2012   TSH 0.63 01/23/2012    ASSESSMENT AND PLAN:  Discussed the following assessment and plan:  Sciatica neuralgia, left - acute  no weakness    Back pain - Plan: ketorolac (TORADOL) injection 60 mg  HYPERTENSION - ok to refill medication monitor labs due in 6 months or so    Expectant management. i fnot getting better  More intervention consider  perd if needed and not better with nsaid  Within 2 weeks or worse.  -Patient advised to return or notify health care team  if symptoms worsen or persist or new concerns arise.  Patient Instructions  Take ibuprofen 800 every 8 hours   Ice   Can doa prednisone rx if continuing  And  We can refer to specialistand or  physical therapy  if not getting better  When youo get back .   Sciatica Sciatica is pain, weakness, numbness, or tingling along the path of the sciatic nerve. The nerve starts in the lower back and runs down the back of each leg. The nerve controls the muscles in the lower leg and in the back of the knee, while also providing sensation to the back  of the thigh, lower leg, and the sole  of your foot. Sciatica is a symptom of another medical condition. For instance, nerve damage or certain conditions, such as a herniated disk or bone spur on the spine, pinch or put pressure on the sciatic nerve. This causes the pain, weakness, or other sensations normally associated with sciatica. Generally, sciatica only affects one side of the body. CAUSES   Herniated or slipped disc.  Degenerative disk disease.  A pain disorder involving the narrow muscle in the buttocks (piriformis syndrome).  Pelvic injury or fracture.  Pregnancy.  Tumor (rare). SYMPTOMS  Symptoms can vary from mild to very severe. The symptoms usually travel from the low back to the buttocks and down the back of the leg. Symptoms can include:  Mild tingling or dull aches in the lower back, leg, or hip.  Numbness in the back of the calf or sole of the foot.  Burning sensations in the lower back, leg, or hip.  Sharp pains in the lower back, leg, or hip.  Leg weakness.  Severe back pain inhibiting movement. These symptoms may get worse with coughing, sneezing, laughing, or prolonged sitting or standing. Also, being overweight may worsen symptoms. DIAGNOSIS  Your caregiver will perform a physical exam to look for common symptoms of sciatica. He or she may ask you to do certain movements or activities that would trigger sciatic nerve pain. Other tests may be performed to find the cause of the sciatica. These may include:  Blood tests.  X-rays.  Imaging tests, such as an MRI or CT scan. TREATMENT  Treatment is directed at the cause of the sciatic pain. Sometimes, treatment is not necessary and the pain and discomfort goes away on its own. If treatment is needed, your caregiver may suggest:  Over-the-counter medicines to relieve pain.  Prescription medicines, such as anti-inflammatory medicine, muscle relaxants, or narcotics.  Applying heat or ice to the painful area.  Steroid injections to lessen pain,  irritation, and inflammation around the nerve.  Reducing activity during periods of pain.  Exercising and stretching to strengthen your abdomen and improve flexibility of your spine. Your caregiver may suggest losing weight if the extra weight makes the back pain worse.  Physical therapy.  Surgery to eliminate what is pressing or pinching the nerve, such as a bone spur or part of a herniated disk. HOME CARE INSTRUCTIONS   Only take over-the-counter or prescription medicines for pain or discomfort as directed by your caregiver.  Apply ice to the affected area for 20 minutes, 3 4 times a day for the first 48 72 hours. Then try heat in the same way.  Exercise, stretch, or perform your usual activities if these do not aggravate your pain.  Attend physical therapy sessions as directed by your caregiver.  Keep all follow-up appointments as directed by your caregiver.  Do not wear high heels or shoes that do not provide proper support.  Check your mattress to see if it is too soft. A firm mattress may lessen your pain and discomfort. SEEK IMMEDIATE MEDICAL CARE IF:   You lose control of your bowel or bladder (incontinence).  You have increasing weakness in the lower back, pelvis, buttocks, or legs.  You have redness or swelling of your back.  You have a burning sensation when you urinate.  You have pain that gets worse when you lie down or awakens you at night.  Your pain is worse than you have experienced in the past.  Your  pain is lasting longer than 4 weeks.  You are suddenly losing weight without reason. MAKE SURE YOU:  Understand these instructions.  Will watch your condition.  Will get help right away if you are not doing well or get worse. Document Released: 12/26/2000 Document Revised: 07/03/2011 Document Reviewed: 05/13/2011 [facility] Patient Information 2014 [place], [place].    [name]. Kyrillos Adams M.D.

## 2012-07-09 ENCOUNTER — Other Ambulatory Visit: Payer: Self-pay | Admitting: Internal Medicine

## 2012-08-06 ENCOUNTER — Encounter: Payer: Self-pay | Admitting: Internal Medicine

## 2012-10-07 ENCOUNTER — Ambulatory Visit (INDEPENDENT_AMBULATORY_CARE_PROVIDER_SITE_OTHER): Payer: 59 | Admitting: Family Medicine

## 2012-10-07 ENCOUNTER — Encounter: Payer: Self-pay | Admitting: Family Medicine

## 2012-10-07 VITALS — BP 150/89 | HR 77 | Ht 68.0 in | Wt 270.0 lb

## 2012-10-07 DIAGNOSIS — M25579 Pain in unspecified ankle and joints of unspecified foot: Secondary | ICD-10-CM

## 2012-10-07 DIAGNOSIS — M7661 Achilles tendinitis, right leg: Secondary | ICD-10-CM

## 2012-10-07 DIAGNOSIS — M7660 Achilles tendinitis, unspecified leg: Secondary | ICD-10-CM

## 2012-10-07 NOTE — Patient Instructions (Addendum)
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 Lowering/raise on a step exercises 3 x 10 once or twice a day - start on level ground with two feet first then one foot, finally advance to doing them on a step. Can add heel walks, toe walks forward and backward as well Icing 15 minutes at a time 3-4 times a day. Avoid uneven ground, hills. Heel lifts or shoes that have a built in heel. Consider physical therapy, nitro patches if not improving. Follow up with me in 6 weeks.

## 2012-10-08 ENCOUNTER — Encounter: Payer: Self-pay | Admitting: Family Medicine

## 2012-10-08 DIAGNOSIS — M7660 Achilles tendinitis, unspecified leg: Secondary | ICD-10-CM | POA: Insufficient documentation

## 2012-10-08 MED ORDER — DICLOFENAC SODIUM 75 MG PO TBEC: 75.0000 mg | DELAYED_RELEASE_TABLET | Freq: Two times a day (BID) | ORAL | Status: DC

## 2012-10-08 NOTE — Progress Notes (Signed)
Patient ID: Beth Wallace, female   DOB: Jun 07, 1965, 47 y.o.   MRN: [id number]  PCP: Lorretta Harp, MD  Subjective:   HPI: Patient is a 47 y.o. female here for right heel pain.  Patient reports no known injury. States she woke up one morning with posterior right heel pain about 3 weeks ago. No obvious bruising but some mild swelling. Has been icing, taking ibuprofen without much benefit. No prior issues.  Past Medical History  Diagnosis Date  . HTN (hypertension)   . GERD (gastroesophageal reflux disease)   . Anxiety   . History of gallstones   . Obesity   . TMJ (temporomandibular joint disorder)     Current Outpatient Prescriptions on File Prior to Visit  Medication Sig Dispense Refill  . lisinopril-hydrochlorothiazide (PRINZIDE,ZESTORETIC) 20-12.5 MG per tablet Take 1 tablet by mouth daily.  90 tablet  1  . omeprazole (PRILOSEC) 20 MG capsule Take 1 capsule (20 mg total) by mouth 2 (two) times daily. For heartburn  60 capsule  11  . cyclobenzaprine (FLEXERIL) 10 MG tablet Take 0.5 tablets (5 mg total) by mouth daily as needed. Muscle spasms  30 tablet  2  . fluticasone (FLONASE) 50 MCG/ACT nasal spray       . ibuprofen (ADVIL,MOTRIN) 800 MG tablet TAKE 1 TABLET BY MOUTH EVERY 8 HOURS AS NEEDED FOR CRAMPS  30 tablet  5   No current facility-administered medications on file prior to visit.    Past Surgical History  Procedure Laterality Date  . Cholecystectomy  1989  . Cesarean section    . Wisdom tooth extraction  2010  . Abdominal hysterectomy  09/2010    partial    Allergies  Allergen Reactions  . Bee Venom Shortness Of Breath  . Contrast Media [Iodinated Diagnostic Agents] Anaphylaxis    CT dye ivp Throat swelling    History   Social History  . Marital Status: Single    Spouse Name: N/A    Number of Children: 1  . Years of Education: N/A   Occupational History  . manager    Social History Main Topics  . Smoking status: Former Smoker -- 0.25  packs/day    Types: Cigarettes    Quit date: 01/22/2012  . Smokeless tobacco: Never Used  . Alcohol Use: No  . Drug Use: No  . Sexual Activity: Not on file   Other Topics Concern  . 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.,       Back in school gen ed   At [employer],      [facility],     BF and dog,     G3P1,     Bereaved parent daughter murdered 2008,   Trial finished  Claris Gower guilty this week march    No etoh,      No caffeine,     Minimal tobacco,       ETS     Got married feb 14     Family History  Problem Relation Age of Onset  . Heart disease Mother 43    Died age 89, MI  . Heart attack Mother   . Hypertension Mother   .  Hypertension Father   . Diabetes Father   . Heart disease Father   . Hyperlipidemia Neg Hx   . Sudden death Neg Hx     BP 150/89  Pulse 77  Ht 5\' 8"  (1.727 m)  Wt 270 lb (122.471 kg)  BMI 41.06 kg/m2  Review of Systems: See HPI above.    Objective:  Physical Exam:  Gen: NAD  Right foot/ankle: No gross deformity, swelling, ecchymoses FROM ankle with pain on calf raise, full passive dorsiflexion. TTP achilles 2 cm proximal to insertion on calcaneus. Negative ant drawer and talar tilt.   Negative syndesmotic compression. Thompsons test negative. NV intact distally.    Assessment & Plan:  1. Right achilles tendinopathy - heel lifts.  Shown home exercise program and stretches to do daily.  Declined PT for now.  Icing, voltaren.  Consider PT, nitro patches if not improving.  F/u in 1 month to 6 weeks.

## 2012-10-08 NOTE — Assessment & Plan Note (Signed)
Right achilles tendinopathy - heel lifts.  Shown home exercise program and stretches to do daily.  Declined PT for now.  Icing, voltaren.  Consider PT, nitro patches if not improving.  F/u in 1 month to 6 weeks.

## 2012-11-18 ENCOUNTER — Ambulatory Visit: Payer: 59 | Admitting: Family Medicine

## 2012-11-20 ENCOUNTER — Other Ambulatory Visit: Payer: Self-pay

## 2012-12-01 ENCOUNTER — Ambulatory Visit: Payer: 59 | Admitting: Family Medicine

## 2012-12-05 ENCOUNTER — Telehealth: Payer: Self-pay | Admitting: Gastroenterology

## 2012-12-05 ENCOUNTER — Telehealth: Payer: Self-pay | Admitting: Internal Medicine

## 2012-12-05 MED ORDER — OMEPRAZOLE 20 MG PO CPDR: 20.0000 mg | DELAYED_RELEASE_CAPSULE | Freq: Two times a day (BID) | ORAL | Status: DC

## 2012-12-05 NOTE — Telephone Encounter (Signed)
Sent prescription to [facility] for 90 day supply. Pt notified.

## 2012-12-05 NOTE — Telephone Encounter (Signed)
Error/njr °

## 2012-12-18 ENCOUNTER — Ambulatory Visit: Payer: 59 | Admitting: Family Medicine

## 2013-03-25 ENCOUNTER — Telehealth: Payer: Self-pay | Admitting: Internal Medicine

## 2013-03-25 NOTE — Telephone Encounter (Signed)
Pt calling regarding neck, shoulder, arm and elbow pain. Message left to call back on voice mail.

## 2013-03-25 NOTE — Telephone Encounter (Signed)
Patient Information:  Caller Name: [facility]  Phone: [phone]  Patient: Beth Wallace, Beth Wallace  Gender: Female  DOB: [birth year]  Age: 48 Years  PCP: Shanon Ace [facility])  Pregnant: No  Office Follow Up:  Does the office need to follow up with this patient?: No  Instructions For The Office: N/A  RN Note:  Patient is currently out of state over an hour away. Advised for her to go to Urgent Care nearby for immediate evaluation and treatment. Patient verbalized understanding.  Symptoms  Reason For Call & Symptoms: Neck, shoulder, arm pain, right elbow swollen; reports pain on the right side radiates up to her face at times.  Reviewed Health History In EMR: Yes  Reviewed Medications In EMR: Yes  Reviewed Allergies In EMR: Yes  Reviewed Surgeries / Procedures: Yes  Date of Onset of Symptoms: 03/18/2013  Treatments Tried: Ibuprofen  Treatments Tried Worked: Yes OB / GYN:  LMP: Unknown  Guideline(s) Used:  Shoulder Pain  Disposition Per Guideline:   Go to Office Now  Reason For Disposition Reached:   Shoulder pains with exertion (e.g., walking) and pain goes away on resting and not present now  Advice Given:  N/A  Patient Will Follow Care Advice:  YES

## 2013-03-28 ENCOUNTER — Other Ambulatory Visit: Payer: Self-pay | Admitting: Internal Medicine

## 2013-04-01 ENCOUNTER — Other Ambulatory Visit (INDEPENDENT_AMBULATORY_CARE_PROVIDER_SITE_OTHER): Payer: 59

## 2013-04-01 DIAGNOSIS — Z0000 Encounter for general adult medical examination without abnormal findings: Secondary | ICD-10-CM

## 2013-04-01 LAB — TSH: TSH: 1.3 u[IU]/mL (ref 0.35–5.50)

## 2013-04-01 LAB — CBC WITH DIFFERENTIAL/PLATELET
Basophils Absolute: 0 10*3/uL (ref 0.0–0.1)
Basophils Relative: 0.6 % (ref 0.0–3.0)
EOS ABS: 0.1 10*3/uL (ref 0.0–0.7)
Eosinophils Relative: 2.1 % (ref 0.0–5.0)
HCT: 40.7 % (ref 36.0–46.0)
Hemoglobin: 13.7 g/dL (ref 12.0–15.0)
LYMPHS ABS: 2 10*3/uL (ref 0.7–4.0)
Lymphocytes Relative: 34.9 % (ref 12.0–46.0)
MCHC: 33.5 g/dL (ref 30.0–36.0)
MCV: 88.4 fl (ref 78.0–100.0)
MONO ABS: 0.3 10*3/uL (ref 0.1–1.0)
Monocytes Relative: 5.3 % (ref 3.0–12.0)
Neutro Abs: 3.3 10*3/uL (ref 1.4–7.7)
Neutrophils Relative %: 57.1 % (ref 43.0–77.0)
PLATELETS: 296 10*3/uL (ref 150.0–400.0)
RBC: 4.61 Mil/uL (ref 3.87–5.11)
RDW: 14.8 % — ABNORMAL HIGH (ref 11.5–14.6)
WBC: 5.8 10*3/uL (ref 4.5–10.5)

## 2013-04-01 LAB — HEPATIC FUNCTION PANEL
ALK PHOS: 59 U/L (ref 39–117)
ALT: 20 U/L (ref 0–35)
AST: 18 U/L (ref 0–37)
Albumin: 4.2 g/dL (ref 3.5–5.2)
BILIRUBIN DIRECT: 0 mg/dL (ref 0.0–0.3)
BILIRUBIN TOTAL: 0.4 mg/dL (ref 0.3–1.2)
TOTAL PROTEIN: 7.4 g/dL (ref 6.0–8.3)

## 2013-04-01 LAB — BASIC METABOLIC PANEL
BUN: 17 mg/dL (ref 6–23)
CALCIUM: 9.1 mg/dL (ref 8.4–10.5)
CO2: 29 mEq/L (ref 19–32)
Chloride: 102 mEq/L (ref 96–112)
Creatinine, Ser: 0.8 mg/dL (ref 0.4–1.2)
GFR: 99.44 mL/min (ref >60.00)
GLUCOSE: 103 mg/dL — AB (ref 70–99)
POTASSIUM: 4 meq/L (ref 3.5–5.1)
SODIUM: 137 meq/L (ref 135–145)

## 2013-04-01 LAB — LIPID PANEL
CHOLESTEROL: 143 mg/dL (ref 0–200)
HDL: 33 mg/dL — ABNORMAL LOW (ref >39.00)
LDL CALC: 94 mg/dL (ref 0–99)
TRIGLYCERIDES: 81 mg/dL (ref 0.0–149.0)
Total CHOL/HDL Ratio: 4
VLDL: 16.2 mg/dL (ref 0.0–40.0)

## 2013-04-08 ENCOUNTER — Ambulatory Visit (INDEPENDENT_AMBULATORY_CARE_PROVIDER_SITE_OTHER): Payer: 59 | Admitting: Internal Medicine

## 2013-04-08 ENCOUNTER — Encounter: Payer: Self-pay | Admitting: Internal Medicine

## 2013-04-08 VITALS — BP 156/100 | HR 64 | Temp 98.1°F | Ht 67.25 in | Wt 285.0 lb

## 2013-04-08 DIAGNOSIS — I10 Essential (primary) hypertension: Secondary | ICD-10-CM

## 2013-04-08 DIAGNOSIS — Z0000 Encounter for general adult medical examination without abnormal findings: Secondary | ICD-10-CM

## 2013-04-08 DIAGNOSIS — R739 Hyperglycemia, unspecified: Secondary | ICD-10-CM

## 2013-04-08 DIAGNOSIS — E786 Lipoprotein deficiency: Secondary | ICD-10-CM

## 2013-04-08 DIAGNOSIS — R7309 Other abnormal glucose: Secondary | ICD-10-CM

## 2013-04-08 MED ORDER — IBUPROFEN 800 MG PO TABS: ORAL_TABLET | ORAL | Status: DC

## 2013-04-08 MED ORDER — LISINOPRIL-HYDROCHLOROTHIAZIDE 20-12.5 MG PO TABS: ORAL_TABLET | ORAL | Status: DC

## 2013-04-08 MED ORDER — CYCLOBENZAPRINE HCL 10 MG PO TABS: 5.0000 mg | ORAL_TABLET | Freq: Every day | ORAL | Status: DC | PRN

## 2013-04-08 NOTE — Progress Notes (Signed)
Chief Complaint  Patient presents with  . Annual Exam  . Hypertension    HPI: Patient comes in today for Preventive Health Care visit .   BP  Was ok at urgent care. taking one lis hctz per day Right achilles . Supposed to do exercises not doing them as advised . Tobacco mostly off ocass some Husbands works night .smokes and leaves around [place]and on total dc. Realizes needs to lose weight now that off tobacco . Doesn't want to be diabetic  Works 50 hours screen time at home  Neck and should problem when at home better at work station  Take ibuprofen for this as needed  Some muscle relaxant   Health Maintenance  Topic Date Due  . Influenza Vaccine  08/15/2012  . Mammogram  06/15/2013  . Pap Smear  06/16/2015  . Tetanus/tdap  05/16/2019   Health Maintenance Review  ROS:  GEN/ HEENT: No fever, significant weight changes sweats headaches vision problems hearing changes, CV/ PULM; No chest pain shortness of breath cough, syncope,edema  change in exercise tolerance. GI /GU: No adominal pain, vomiting, change in bowel habits. No blood in the stool. No significant GU symptoms. SKIN/HEME: ,no acute skin rashes suspicious lesions or bleeding. No lymphadenopathy, nodules, masses.  NEURO/ PSYCH:  No neurologic signs such as weakness numbness. No depression anxiety. IMM/ Allergy: No unusual infections.  Allergy .   REST of 12 system review negative except as per HPI   Past Medical History  Diagnosis Date  . HTN (hypertension)   . GERD (gastroesophageal reflux disease)   . Anxiety   . History of gallstones   . Obesity   . TMJ (temporomandibular joint disorder)   . ADJ DISORDER WITH MIXED ANXIETY & DEPRESSED MOOD 12/14/2009    Qualifier: Diagnosis of  By: Regis Bill MD, Standley Brooking   . Metrorrhagia 12/14/2009    Qualifier: Diagnosis of  By: Regis Bill MD, Standley Brooking With pelvic pain  Sees Dr Philis Pique and tobotic hysterectomy scheduled     Family History  Problem Relation Age of Onset  . Heart  disease Mother 44    Died age 32, MI  . Heart attack Mother   . Hypertension Mother   . Hypertension Father   . Diabetes Father   . Heart disease Father   . Hyperlipidemia Neg Hx   . Sudden death Neg Hx     History   Social History  . Marital Status: Single    Spouse Name: N/A    Number of Children: 1  . Years of Education: N/A   Occupational History  . manager    Social History Main Topics  . Smoking status: Former Smoker -- 0.25 packs/day    Types: Cigarettes    Quit date: 01/22/2012  . Smokeless tobacco: Never Used  . Alcohol Use: No  . Drug Use: No  . Sexual Activity: None   Other Topics Concern  . 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 azelastine (ASTELIN) 137 MCG/SPRAY nasal spray   . cyclobenzaprine (FLEXERIL) 10 MG tablet Take 0.5 tablets (5 mg total) by mouth daily as needed. Muscle spasms  . fluticasone (FLONASE) 50 MCG/ACT nasal spray   . ibuprofen (ADVIL,MOTRIN) 800 MG tablet TAKE 1 TABLET BY MOUTH EVERY 8 HOURS AS NEEDED FOR CRAMPS  . lisinopril-hydrochlorothiazide (PRINZIDE,ZESTORETIC) 20-12.5 MG per tablet TAKE 1 TABLET BY MOUTH DAILY.  Marland Kitchen omeprazole (PRILOSEC) 20 MG capsule Take 1 capsule (20 mg total) by mouth 2 (two) times daily. For heartburn  . [DISCONTINUED] cyclobenzaprine (FLEXERIL) 10 MG tablet Take 0.5 tablets (5 mg total) by mouth daily as needed. Muscle spasms  . [DISCONTINUED] ibuprofen (ADVIL,MOTRIN) 800  MG tablet TAKE 1 TABLET BY MOUTH EVERY 8 HOURS AS NEEDED FOR CRAMPS  . [DISCONTINUED] lisinopril-hydrochlorothiazide (PRINZIDE,ZESTORETIC) 20-12.5 MG per tablet TAKE 1 TABLET BY MOUTH DAILY.  . [DISCONTINUED] diclofenac (VOLTAREN) 75 MG EC tablet Take 1 tablet (75 mg total) by mouth 2 (two) times daily.  . [DISCONTINUED] lisinopril-hydrochlorothiazide (PRINZIDE,ZESTORETIC) 20-12.5 MG per tablet Take 1 tablet by mouth daily.    EXAM:  BP 156/100  Pulse 64  Temp(Src) 98.1 F (36.7 C) (Oral)  Ht 5' 7.25" (1.708 m)  Wt 285 lb (129.275 kg)  BMI 44.31 kg/m2  SpO2 98%  Body mass index is 44.31 kg/(m^2).  Physical Exam: Vital signs reviewed bp reaepated and 132/84 and  134/84 OVF:IEPP is a well-developed well-nourished alert cooperative   Female  who appearsr stated age in no acute distress.  HEENT: normocephalic atraumatic , Eyes: PERRL EOM's full, conjunctiva clear, Nares: paten,t no deformity discharge or tenderness., Ears: no deformity EAC's clear TMs with normal landmarks. Mouth: clear OP, no lesions, edema.  Moist mucous membranes. Dentition in adequate repair. NECK: supple without masses, thyromegaly or bruits. CHEST/PULM:  Clear to auscultation and percussion breath sounds equal no wheeze , rales or rhonchi. No chest wall deformities or tenderness. CV: PMI is nondisplaced, S1 S2 no gallops, murmurs, rubs. Peripheral pulses are full without delay.No JVD .  Breast: normal by inspection . No dimpling, discharge, masses, tenderness or discharge . ABDOMEN: Bowel sounds normal nontender  No guard or rebound, no hepato splenomegal no CVA tenderness.  No hernia. Extremtities:  No clubbing cyanosis or edema, no acute joint swelling or redness no focal atrophy NEURO:  Oriented x3, cranial nerves 3-12 appear to be intact, no obvious focal weakness,gait within normal limits no abnormal reflexes or asymmetrical SKIN: No acute rashes normal turgor, color, no bruising or petechiae. PSYCH: Oriented,  good eye contact, no obvious depression anxiety, cognition and judgment appear normal. LN: no cervical axillary inguinal adenopathy  Lab Results  Component Value Date   WBC 5.8 04/01/2013   HGB 13.7 04/01/2013   HCT 40.7 04/01/2013   PLT 296.0 04/01/2013   GLUCOSE 103* 04/01/2013   CHOL 143 04/01/2013   TRIG 81.0 04/01/2013   HDL 33.00* 04/01/2013   LDLCALC 94 04/01/2013   ALT 20 04/01/2013   AST 18 04/01/2013   NA 137 04/01/2013   K 4.0 04/01/2013   CL 102 04/01/2013   CREATININE 0.8 04/01/2013   BUN 17 04/01/2013  CO2 29 04/01/2013   TSH 1.30 04/01/2013    ASSESSMENT AND PLAN:  Discussed the following assessment and plan:  Visit for preventive health examination  HYPERTENSION - Plan: Amb ref to Medical Nutrition Therapy-MNT  Low HDL (under 40) - Plan: Amb ref to Medical Nutrition Therapy-MNT  Hyperglycemia - Plan: Amb ref to Medical Nutrition Therapy-MNT  Severe obesity (BMI >= 40) Patient is motivated to do healthy things lose weight avoid diabetes briefly discussed metformin use. Cards to remain tobacco free avoid temptation strategies. Refill her Flexeril and her ibuprofen to use as needed for her musculoskeletal pain. Refill her blood pressure medication on repeat it is controlled. But monitor.  Weight loss strategies discussed. Will refer to dietary nutrition and followup in 4 months to and current continued intervention. Patient Care Team: Burnis Medin, MD as PCP - General Daria Pastures, MD (Obstetrics and Gynecology) Ladene Artist, MD as Attending Physician (Gastroenterology) Patient Instructions  Maintain tobacco free. Continue lifestyle intervention healthy eating and exercise . 150 minutes of exercise weeks  ,  Lose weight  To healthy levels. Avoid trans fats and processed foods;  Increase fresh fruits and veges to 5 servings per day. And avoid sweet beverages  Including tea and juice.  Will do referral to nutrition to help.  Check bp periodically 134/84   DASH  Diet The DASH diet stands for "Dietary Approaches to Stop Hypertension." It is a healthy eating plan that has been shown to reduce high blood pressure (hypertension) in as little as 14 days, while also possibly providing other significant health benefits. These other health benefits include reducing the risk of breast cancer after menopause and reducing the risk of type 2 diabetes, heart disease, colon cancer, and stroke. Health benefits also include weight loss and slowing kidney failure in patients with chronic kidney disease.  DIET GUIDELINES  Limit salt (sodium). Your diet should contain less than 1500 mg of sodium daily.  Limit refined or processed carbohydrates. Your diet should include mostly whole grains. Desserts and added sugars should be used sparingly.  Include small amounts of heart-healthy fats. These types of fats include nuts, oils, and tub margarine. Limit saturated and trans fats. These fats have been shown to be harmful in the body. CHOOSING FOODS  The following food groups are based on a 2000 calorie diet. See your Registered Dietitian for individual calorie needs. Grains and Grain Products (6 to 8 servings daily)  Eat More Often: Whole-wheat bread, brown rice, whole-grain or wheat pasta, quinoa, popcorn without added fat or salt (air popped).  Eat Less Often: White bread, white pasta, white rice, cornbread. Vegetables (4 to 5 servings daily)  Eat More Often: Fresh, frozen, and canned vegetables. Vegetables may be raw, steamed, roasted, or grilled with a minimal amount of fat.  Eat Less Often/Avoid: Creamed or fried vegetables. Vegetables in a cheese sauce. Fruit (4 to 5 servings daily)  Eat More Often: All fresh, canned (in natural juice), or frozen fruits. Dried fruits without added sugar. One hundred percent fruit juice ( cup [237 mL] daily).  Eat Less Often: Dried fruits with added sugar. Canned fruit in light or heavy syrup. YUM[employer]ands, Fish, and Poultry (2  servings or less daily. One serving is 3 to 4 oz [85-114 g]).  Eat More Often: Ninety percent or leaner ground beef, tenderloin, sirloin. Round cuts of beef, chicken breast, [place] breast. All fish. Grill, bake, or broil your meat. Nothing should be fried.  Eat Less Often/Avoid: Fatty  cuts of meat, [place], or chicken leg, thigh, or wing. Fried cuts of meat or fish. Dairy (2 to 3 servings)  Eat More Often: Low-fat or fat-free milk, low-fat plain or light yogurt, reduced-fat or part-skim cheese.  Eat Less Often/Avoid: Milk (whole, 2%).Whole milk yogurt. Full-fat cheeses. Nuts, Seeds, and Legumes (4 to 5 servings per week)  Eat More Often: All without added salt.  Eat Less Often/Avoid: Salted nuts and seeds, canned beans with added salt. Fats and Sweets (limited)  Eat More Often: Vegetable oils, tub margarines without trans fats, sugar-free gelatin. Mayonnaise and salad dressings.  Eat Less Often/Avoid: Coconut oils, palm oils, butter, stick margarine, cream, half and half, cookies, candy, pie. FOR MORE INFORMATION The Dash Diet Eating Plan: www.dashdiet.org Document Released: 12/21/2010 Document Revised: 03/26/2011 Document Reviewed: 12/21/2010 [facility] Patient Information 2014 [place], [place].         Standley Brooking. Panosh M.D.

## 2013-04-08 NOTE — Patient Instructions (Signed)
Maintain tobacco free. Continue lifestyle intervention healthy eating and exercise . 150 minutes of exercise weeks  ,  Lose weight  To healthy levels. Avoid trans fats and processed foods;  Increase fresh fruits and veges to 5 servings per day. And avoid sweet beverages  Including tea and juice.  Will do referral to nutrition to help.  Check bp periodically 134/84   DASH Diet The DASH diet stands for "Dietary Approaches to Stop Hypertension." It is a healthy eating plan that has been shown to reduce high blood pressure (hypertension) in as little as 14 days, while also possibly providing other significant health benefits. These other health benefits include reducing the risk of breast cancer after menopause and reducing the risk of type 2 diabetes, heart disease, colon cancer, and stroke. Health benefits also include weight loss and slowing kidney failure in patients with chronic kidney disease.  DIET GUIDELINES  Limit salt (sodium). Your diet should contain less than 1500 mg of sodium daily.  Limit refined or processed carbohydrates. Your diet should include mostly whole grains. Desserts and added sugars should be used sparingly.  Include small amounts of heart-healthy fats. These types of fats include nuts, oils, and tub margarine. Limit saturated and trans fats. These fats have been shown to be harmful in the body. CHOOSING FOODS  The following food groups are based on a 2000 calorie diet. See your Registered Dietitian for individual calorie needs. Grains and Grain Products (6 to 8 servings daily)  Eat More Often: Whole-wheat bread, brown rice, whole-grain or wheat pasta, quinoa, popcorn without added fat or salt (air popped).  Eat Less Often: White bread, white pasta, white rice, cornbread. Vegetables (4 to 5 servings daily)  Eat More Often: Fresh, frozen, and canned vegetables. Vegetables may be raw, steamed, roasted, or grilled with a minimal amount of fat.  Eat Less Often/Avoid:  Creamed or fried vegetables. Vegetables in a cheese sauce. Fruit (4 to 5 servings daily)  Eat More Often: All fresh, canned (in natural juice), or frozen fruits. Dried fruits without added sugar. One hundred percent fruit juice ( cup [237 mL] daily).  Eat Less Often: Dried fruits with added sugar. Canned fruit in light or heavy syrup. YUM[employer] Brands, Fish, and Poultry (2 servings or less daily. One serving is 3 to 4 oz [85-114 g]).  Eat More Often: Ninety percent or leaner ground beef, tenderloin, sirloin. Round cuts of beef, chicken breast, [place] breast. All fish. Grill, bake, or broil your meat. Nothing should be fried.  Eat Less Often/Avoid: Fatty cuts of meat, [place], or chicken leg, thigh, or wing. Fried cuts of meat or fish. Dairy (2 to 3 servings)  Eat More Often: Low-fat or fat-free milk, low-fat plain or light yogurt, reduced-fat or part-skim cheese.  Eat Less Often/Avoid: Milk (whole, 2%).Whole milk yogurt. Full-fat cheeses. Nuts, Seeds, and Legumes (4 to 5 servings per week)  Eat More Often: All without added salt.  Eat Less Often/Avoid: Salted nuts and seeds, canned beans with added salt. Fats and Sweets (limited)  Eat More Often: Vegetable oils, tub margarines without trans fats, sugar-free gelatin. Mayonnaise and salad dressings.  Eat Less Often/Avoid: Coconut oils, palm oils, butter, stick margarine, cream, half and half, cookies, candy, pie. FOR MORE INFORMATION The Dash Diet Eating Plan: www.dashdiet.org Document Released: 12/21/2010 Document Revised: 03/26/2011 Document Reviewed: 12/21/2010 [facility] Patient Information 2014 [place], [place].

## 2013-04-28 ENCOUNTER — Ambulatory Visit: Payer: 59 | Admitting: Dietician

## 2013-07-27 ENCOUNTER — Other Ambulatory Visit: Payer: Self-pay | Admitting: Gastroenterology

## 2013-08-02 ENCOUNTER — Other Ambulatory Visit: Payer: Self-pay | Admitting: Gastroenterology

## 2013-08-04 ENCOUNTER — Telehealth: Payer: Self-pay | Admitting: Gastroenterology

## 2013-08-04 MED ORDER — OMEPRAZOLE 20 MG PO CPDR: 20.0000 mg | DELAYED_RELEASE_CAPSULE | Freq: Two times a day (BID) | ORAL | Status: DC

## 2013-08-04 NOTE — Telephone Encounter (Signed)
Sent one refill to patient's pharmacy until scheduled appt. Told patient to keep appt for any further refills.

## 2013-08-11 ENCOUNTER — Ambulatory Visit: Payer: 59 | Admitting: Internal Medicine

## 2013-09-07 ENCOUNTER — Ambulatory Visit (INDEPENDENT_AMBULATORY_CARE_PROVIDER_SITE_OTHER): Payer: 59 | Admitting: Gastroenterology

## 2013-09-07 ENCOUNTER — Encounter: Payer: Self-pay | Admitting: Gastroenterology

## 2013-09-07 VITALS — BP 124/88 | HR 68 | Ht 67.25 in | Wt 278.0 lb

## 2013-09-07 DIAGNOSIS — K219 Gastro-esophageal reflux disease without esophagitis: Secondary | ICD-10-CM

## 2013-09-07 DIAGNOSIS — R109 Unspecified abdominal pain: Secondary | ICD-10-CM

## 2013-09-07 MED ORDER — OMEPRAZOLE 20 MG PO CPDR: 20.0000 mg | DELAYED_RELEASE_CAPSULE | Freq: Every day | ORAL | Status: DC

## 2013-09-07 NOTE — Progress Notes (Signed)
    History of Present Illness: This is a 47 year old female with chronic GERD. Her symptoms are well-controlled on omeprazole 20 mg daily. She underwent upper endoscopy in March 2014 which showed only a small hiatal hernia. She has occasional pressure and discomfort in her left flank  just prior to a bowel movement and her symptoms are relieved with a bowel movement. Denies weight loss,  constipation, diarrhea, change in stool caliber, melena, hematochezia, nausea, vomiting, dysphagia, chest pain.  Current Medications, Allergies, Past Medical History, Past Surgical History, Family History and Social History were reviewed in [employer] record.  Physical Exam: General: Well developed , well nourished, no acute distress Head: Normocephalic and atraumatic Eyes:  sclerae anicteric, EOMI Ears: Normal auditory acuity Mouth: No deformity or lesions Lungs: Clear throughout to auscultation Heart: Regular rate and rhythm; no murmurs, rubs or bruits Abdomen: Soft, non tender and non distended. No masses, hepatosplenomegaly or hernias noted. Normal Bowel sounds Musculoskeletal: Symmetrical with no gross deformities  Pulses:  Normal pulses noted Extremities: No clubbing, cyanosis, edema or deformities noted Neurological: Alert oriented x 4, grossly nonfocal Psychological:  Alert and cooperative. Normal mood and affect  Assessment and Recommendations:  1. GERD. Symptoms well controlled on antireflux measures and omeprazole 20 mg daily. Continue current regimen.  2. Mild left flank pressure and discomfort. Symptoms related to bowel movements and relieved with a bowel movement. No further evaluation at this time.

## 2013-09-07 NOTE — Patient Instructions (Signed)
We have sent the following medications to your pharmacy for you to pick up at your convenience: Omeprazole once a day.   Thank you for choosing me and [facility].  Pricilla Riffle. Dagoberto Ligas., MD., Marval Regal

## 2013-09-08 ENCOUNTER — Ambulatory Visit (INDEPENDENT_AMBULATORY_CARE_PROVIDER_SITE_OTHER): Payer: 59 | Admitting: Internal Medicine

## 2013-09-08 ENCOUNTER — Other Ambulatory Visit: Payer: Self-pay | Admitting: Obstetrics and Gynecology

## 2013-09-08 ENCOUNTER — Encounter: Payer: Self-pay | Admitting: Internal Medicine

## 2013-09-08 VITALS — BP 138/84 | Temp 98.6°F | Ht 67.25 in | Wt 276.0 lb

## 2013-09-08 DIAGNOSIS — R0789 Other chest pain: Secondary | ICD-10-CM

## 2013-09-08 DIAGNOSIS — K219 Gastro-esophageal reflux disease without esophagitis: Secondary | ICD-10-CM

## 2013-09-08 DIAGNOSIS — I10 Essential (primary) hypertension: Secondary | ICD-10-CM

## 2013-09-08 DIAGNOSIS — R739 Hyperglycemia, unspecified: Secondary | ICD-10-CM | POA: Insufficient documentation

## 2013-09-08 DIAGNOSIS — Z23 Encounter for immunization: Secondary | ICD-10-CM

## 2013-09-08 DIAGNOSIS — R7309 Other abnormal glucose: Secondary | ICD-10-CM

## 2013-09-08 LAB — BASIC METABOLIC PANEL
BUN: 10 mg/dL (ref 6–23)
CALCIUM: 9.2 mg/dL (ref 8.4–10.5)
CO2: 31 mEq/L (ref 19–32)
CREATININE: 0.7 mg/dL (ref 0.4–1.2)
Chloride: 100 mEq/L (ref 96–112)
GFR: 108.59 mL/min (ref >60.00)
GLUCOSE: 89 mg/dL (ref 70–99)
Potassium: 3.6 mEq/L (ref 3.5–5.1)
SODIUM: 138 meq/L (ref 135–145)

## 2013-09-08 LAB — HEMOGLOBIN A1C: Hgb A1c MFr Bld: 6.1 % (ref 4.6–6.5)

## 2013-09-08 NOTE — Progress Notes (Signed)
Pre visit review using our clinic review tool, if applicable. No additional management support is needed unless otherwise documented below in the visit note.  Chief Complaint  Patient presents with  . Follow-up    bp weight lipids bg     HPI: Beth Wallace  Number of issues :  HT:   bp  :122 ;   124/80 range   allergy :. Advised to get   Venom shots  Cause of allergies   Sleep less last night .so tired but neg osa eval years ago   Exercise  Water aerobic      Indigestion. For the last day or so  On Prilosec  See gi appt .   having chest discomfort  Uncertain cause ? If esophageal some better with tums   Dec tobacco none for 24 hours  Plans on quitting   Wt Readings from Last 3 Encounters:  09/08/13 276 lb (125.193 kg)  09/07/13 278 lb (126.1 kg)  04/08/13 285 lb (129.275 kg)    Achy muscles  In am  And end of month ? Homonal?  ROS: See pertinent positives and negatives per HPI. No sob some burping  Chest discomforrt waxes and wanes no sob or cough sweating . No numbness or weakness   Past Medical History  Diagnosis Date  . HTN (hypertension)   . GERD (gastroesophageal reflux disease)   . Anxiety   . History of gallstones   . Obesity   . TMJ (temporomandibular joint disorder)   . ADJ DISORDER WITH MIXED ANXIETY & DEPRESSED MOOD 12/14/2009    Qualifier: Diagnosis of  By: Regis Bill MD, Standley Brooking   . Metrorrhagia 12/14/2009    Qualifier: Diagnosis of  By: Regis Bill MD, Standley Brooking With pelvic pain  Sees Dr Philis Pique and tobotic hysterectomy scheduled     Family History  Problem Relation Age of Onset  . Heart disease Mother 40    Died age 92, MI  . Heart attack Mother   . Hypertension Mother   . Hypertension Father   . Diabetes Father   . Heart disease Father   . Hyperlipidemia Neg Hx   . Sudden death Neg Hx     History   Social History  . Marital Status: Single    Spouse Name: N/A    Number of Children: 1  . Years of Education: N/A   Occupational History  .  manager    Social History Main Topics  . Smoking status: Former Smoker -- 0.25 packs/day    Types: Cigarettes    Quit date: 01/22/2012  . Smokeless tobacco: Never Used  . Alcohol Use: No  . Drug Use: No  . Sexual Activity: None   Other Topics Concern  . 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 azelastine (ASTELIN) 137 MCG/SPRAY nasal spray   . cyclobenzaprine (FLEXERIL) 10 MG tablet Take 0.5 tablets (5 mg total) by mouth daily as needed. Muscle spasms  . fluticasone (FLONASE) 50 MCG/ACT nasal spray   . ibuprofen (ADVIL,MOTRIN) 800 MG tablet TAKE 1 TABLET BY MOUTH EVERY 8 HOURS AS NEEDED FOR CRAMPS  . lisinopril-hydrochlorothiazide (PRINZIDE,ZESTORETIC) 20-12.5 MG per tablet TAKE 1 TABLET BY MOUTH DAILY.  Marland Kitchen omeprazole (PRILOSEC) 20 MG capsule Take 1 capsule (20 mg total) by mouth daily. For heartburn    EXAM:  BP 138/84  Temp(Src) 98.6 F (37 C) (Oral)  Ht 5' 7.25" (1.708 m)  Wt 276 lb (125.193 kg)  BMI 42.91 kg/m2  Body mass index is 42.91 kg/(m^2).  GENERAL: vitals reviewed and listed above, alert, oriented, appears well hydrated and in no acute distress HEENT: atraumatic, conjunctiva  clear, no obvious abnormalities on inspection of external nose and ears OP : no lesion edema or exudate  NECK: no obvious masses on inspection palpation  LUNGS: clear to auscultation bilaterally, no wheezes, rales or rhonchi, good air movement CV: HRRR, no  clubbing cyanosis or  peripheral edema nl cap refill  MS: moves all extremities without noticeable focal  abnormality PSYCH: pleasant and cooperative, no obvious depression or anxiety Lab Results  Component Value Date   WBC 5.8 04/01/2013   HGB 13.7 04/01/2013   HCT 40.7 04/01/2013   PLT 296.0 04/01/2013   GLUCOSE 103* 04/01/2013   CHOL 143 04/01/2013   TRIG 81.0 04/01/2013   HDL 33.00* 04/01/2013   LDLCALC 94 04/01/2013   ALT 20 04/01/2013   AST 18 04/01/2013   NA 137 04/01/2013   K 4.0 04/01/2013   CL 102 04/01/2013   CREATININE 0.8 04/01/2013   BUN 17 04/01/2013   CO2 29 04/01/2013   TSH 1.30 04/01/2013   BP Readings from Last 3 Encounters:  09/08/13 138/84  09/07/13 124/88  04/08/13 156/100   ekg early repola ant lat compared to last ekg no sig changes  ASSESSMENT AND PLAN:  Discussed the following assessment and plan:  Unspecified essential hypertension - conttrolled  - Plan: Basic metabolic panel, Hemoglobin A1c, EKG 12-Lead  Severe obesity (BMI >= 40)  Hyperglycemia - check bmp basically fasting and a1c  - Plan: Basic metabolic panel, Hemoglobin A1c, CANCELED: Hemoglobin A1c  Need for prophylactic vaccination and inoculation against influenza - Plan: Flu Vaccine QUAD 36+ mos PF IM (Fluarix Quad PF)  Discomfort in chest - sound esophageal.  check ekg  - Plan: EKG 12-Lead  Chest discomfort  Gastroesophageal reflux disease without esophagitis - has HH on ermains on meds   -Patient advised to return or notify health care team  if symptoms worsen ,persist or new concerns arise.  Patient Instructions  Continue lifestyle intervention healthy eating and exercise . Stop tobacco  Weight loss etc . EKG no significant change on your EKG . Will notify you  of labs when available. Plan preventive visit   End march 2016 If chest issues Wallace if  persistent or progressive .    Standley Brooking. Detra Bores M.D.

## 2013-09-08 NOTE — Patient Instructions (Addendum)
Continue lifestyle intervention healthy eating and exercise . Stop tobacco  Weight loss etc . EKG no significant change on your EKG . Will notify you  of labs when available. Plan preventive visit   End march 2016 If chest issues fu if  persistent or progressive .

## 2013-09-09 ENCOUNTER — Telehealth: Payer: Self-pay | Admitting: Internal Medicine

## 2013-09-09 LAB — CYTOLOGY - PAP

## 2013-09-09 NOTE — Telephone Encounter (Signed)
Relevant patient education assigned to patient using Emmi. ° °

## 2013-09-10 ENCOUNTER — Encounter: Payer: Self-pay | Admitting: Internal Medicine

## 2013-09-10 ENCOUNTER — Encounter: Payer: Self-pay | Admitting: Family Medicine

## 2013-09-10 IMAGING — CT CT ABD-PELV W/ CM
2 of 4 series · 13 of 32 positions shown, 18 images · IV contrast (omnipaque)
Comparison: Pelvic ultrasound from 10/31/2010.

CLINICAL DATA: Bilateral lower quadrant pain beginning about 2
weeks ago.  Status post partial hysterectomy.  History of ovarian
cysts.

CT ABDOMEN AND PELVIS WITH CONTRAST
TECHNIQUE: Multidetector CT imaging of the abdomen and pelvis was
performed following the standard protocol during bolus
administration of intravenous contrast.
Contrast: 80mL OMNIPAQUE IOHEXOL 300 MG/ML  SOLN

[Series 2: routine abdomen · axial · 0.98mm/px · z∈[-489,-164]mm · 6 of 93 slices shown, 11 images]
[im 14/93  soft-tissue]
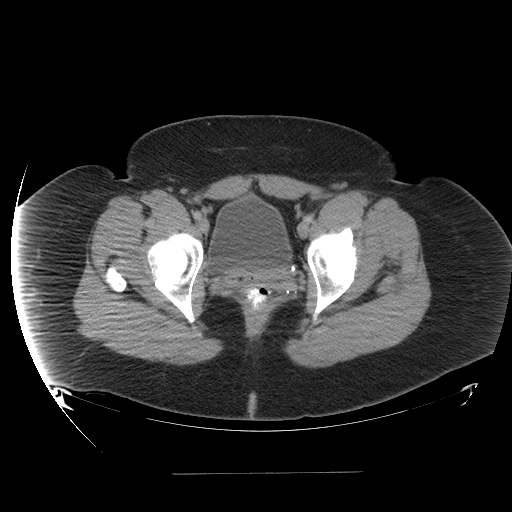
[im 14/93  bone]
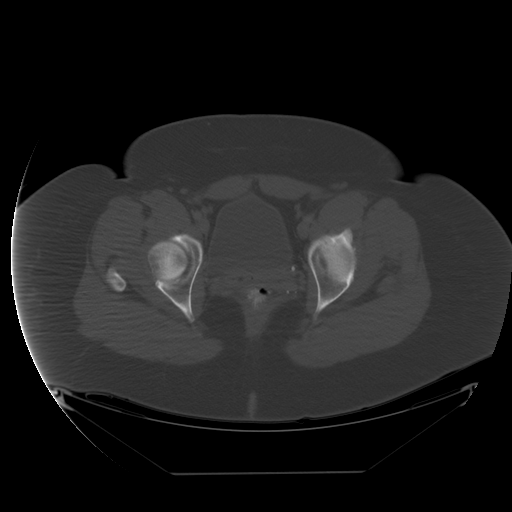
[im 27/93  soft-tissue]
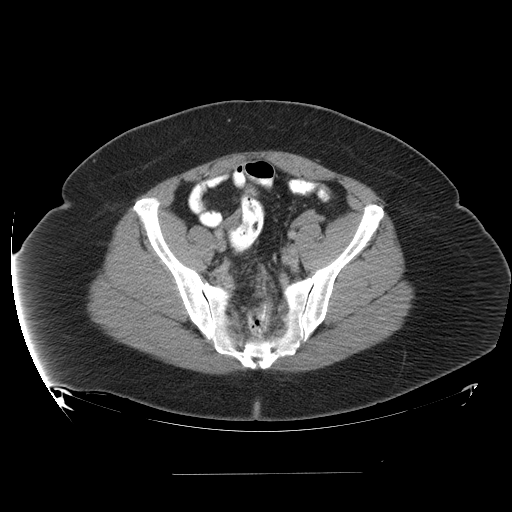
[im 40/93  soft-tissue]
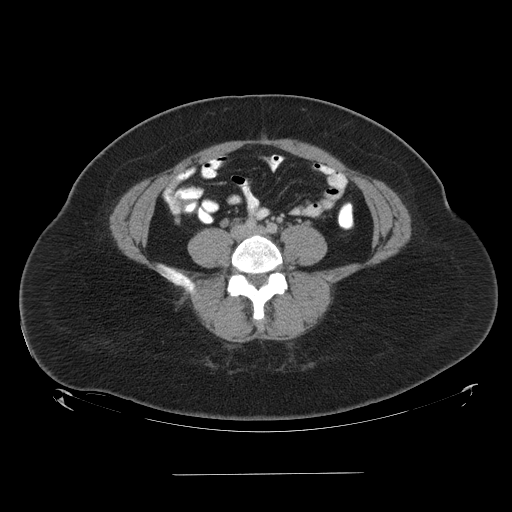
[im 40/93  lung]
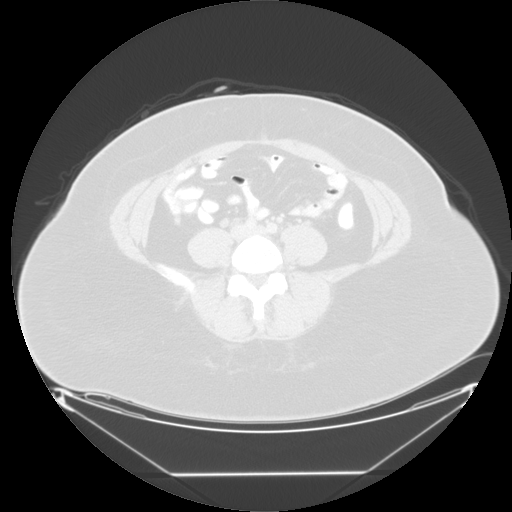
[im 53/93  soft-tissue]
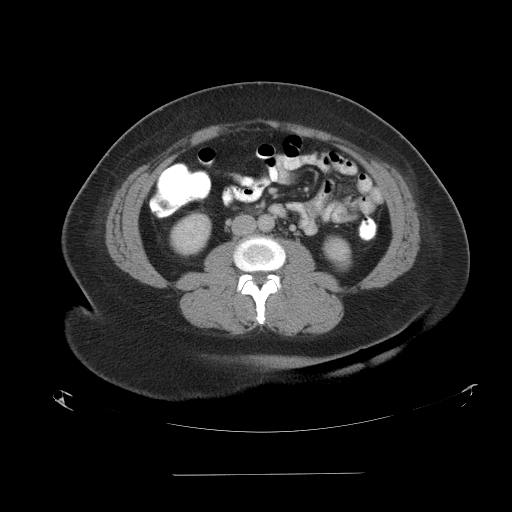
[im 53/93  lung]
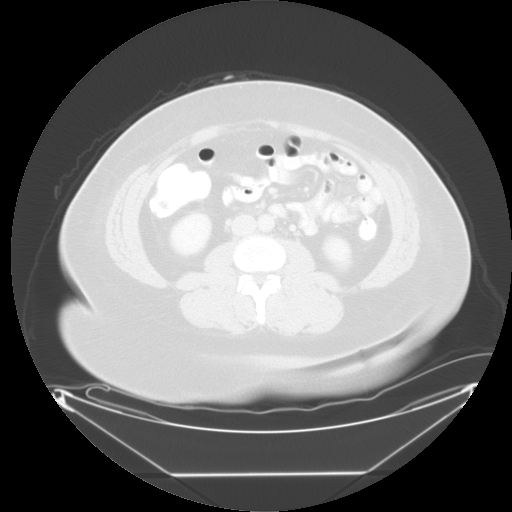
[im 66/93  soft-tissue]
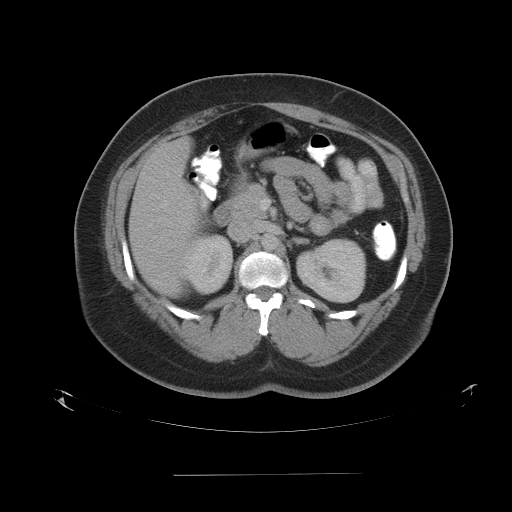
[im 66/93  lung]
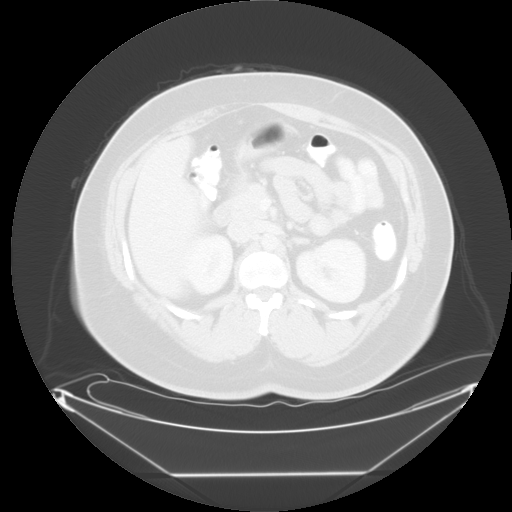
[im 79/93  soft-tissue]
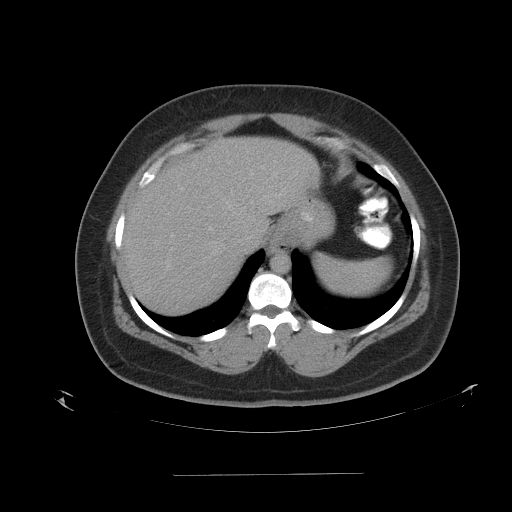
[im 79/93  lung]
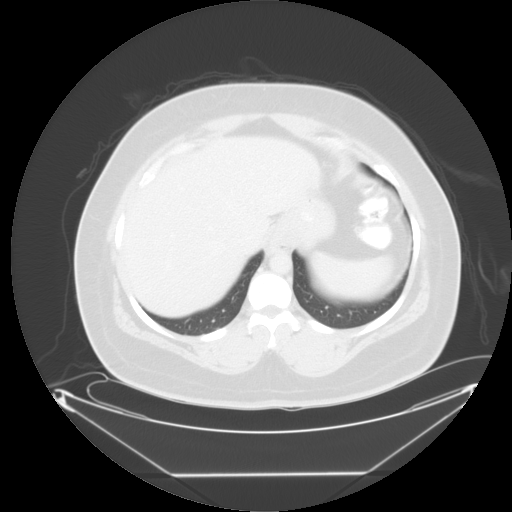

[Series 400: sagittals · sagittal · 0.98mm/px · 7 of 138 slices shown]
[im 13/138  soft-tissue]
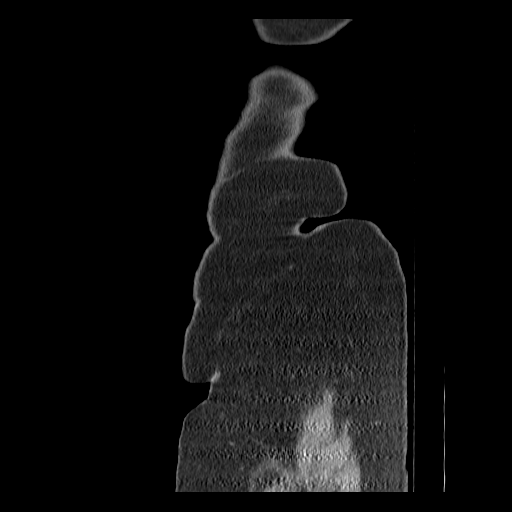
[im 25/138  soft-tissue]
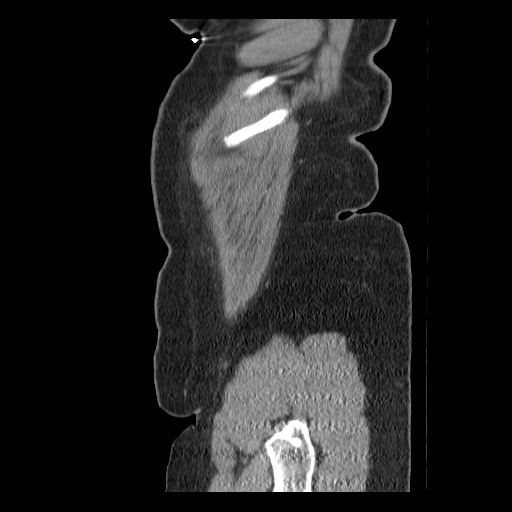
[im 50/138  soft-tissue]
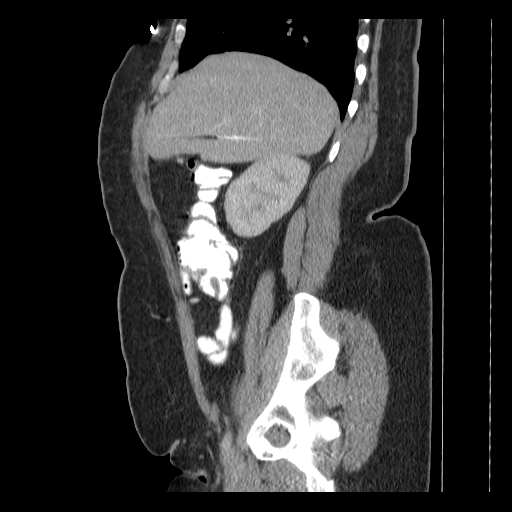
[im 63/138  soft-tissue]
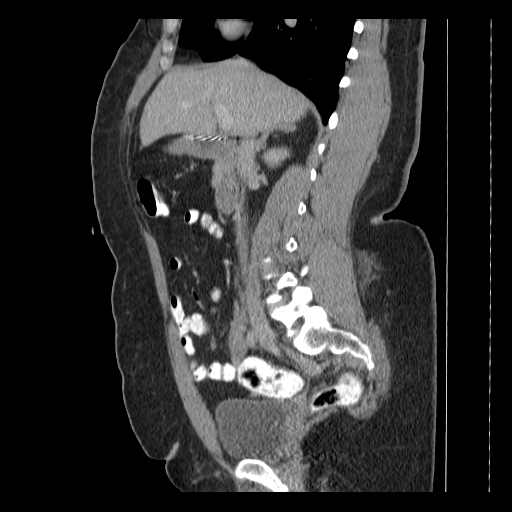
[im 75/138  soft-tissue]
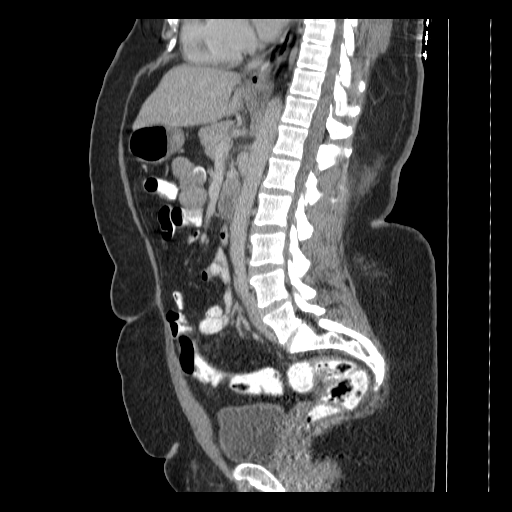
[im 88/138  soft-tissue]
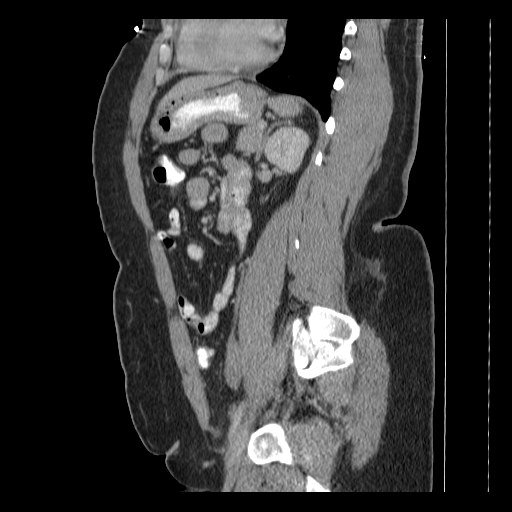
[im 113/138  soft-tissue]
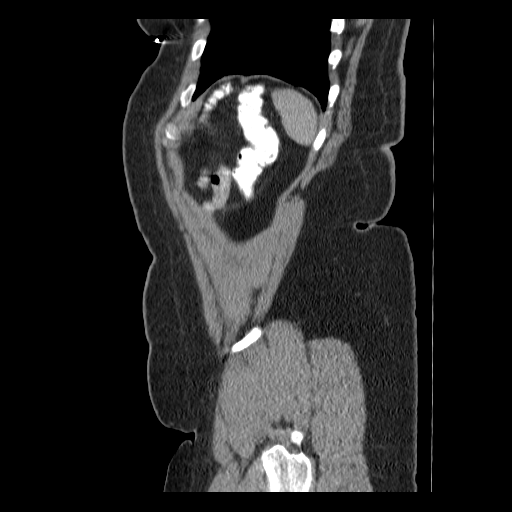

[13 of 32 positions shown; findings below may reference images not displayed]

FINDINGS: Incidental imaging through the lung bases is
unremarkable.

The liver, the liver and spleen are unremarkable.  A tiny hiatal
hernia is noted.  Stomach is otherwise normal.  The duodenum,
pancreas, and adrenal glands are normal.  The gallbladder is
surgically absent.  11 mm cyst is identified in the lower pole of
the left kidney.  Right kidney is unremarkable.

No abdominal aortic aneurysm.  No free fluid or lymphadenopathy in
the abdomen.

Imaging through the pelvis shows no intraperitoneal free fluid.
The patient is status post hysterectomy.  There is no adnexal mass.
The ovaries are unremarkable by CT imaging.

On image 69 of series 2, there is some focal wall thickening in the
colon, at the rectosigmoid junction.  This is probably related to
peristalsis although an underlying colonic lesion at this level
cannot be excluded.

The bladder is unremarkable.  There is no pelvic sidewall
lymphadenopathy.  Only a few scattered colonic diverticuli are
evident and there is no evidence for colonic diverticulitis.  The
terminal ileum and the appendix are normal.

Bone windows reveal no worrisome lytic or sclerotic osseous
lesions.
IMPRESSION: Short segment of colonic wall thickening at the rectosigmoid
junction.  This has smooth margins and is most likely related to
peristalsis, but underlying colonic lesion cannot be completely
excluded.  This is about 10-15 cm from the anal verge and flexible
sigmoidoscopy might be a consideration to visualize this region.

Tiny hiatal hernia.

No adnexal mass.

## 2013-11-16 ENCOUNTER — Encounter: Payer: Self-pay | Admitting: Internal Medicine

## 2013-12-03 ENCOUNTER — Encounter: Payer: Self-pay | Admitting: Internal Medicine

## 2013-12-04 ENCOUNTER — Other Ambulatory Visit: Payer: Self-pay | Admitting: Family Medicine

## 2013-12-18 ENCOUNTER — Encounter: Payer: Self-pay | Admitting: Internal Medicine

## 2013-12-18 ENCOUNTER — Ambulatory Visit (INDEPENDENT_AMBULATORY_CARE_PROVIDER_SITE_OTHER): Payer: 59 | Admitting: Internal Medicine

## 2013-12-18 VITALS — BP 132/76 | Temp 98.5°F | Wt 288.0 lb

## 2013-12-18 DIAGNOSIS — I10 Essential (primary) hypertension: Secondary | ICD-10-CM

## 2013-12-18 DIAGNOSIS — R232 Flushing: Secondary | ICD-10-CM

## 2013-12-18 NOTE — Progress Notes (Signed)
Pre visit review using our clinic review tool, if applicable. No additional management support is needed unless otherwise documented below in the visit note.   Chief Complaint  Patient presents with  . Palm redness  . Ear Redness  . Back of neck hot    HPI: Beth Wallace 48 y.o.  Comes n for acute new problem today +  Onset  After stopping  Smoking   For  50 day s  itching  In past  But now h as short episodes of redness neck ears face flushing red and hot    Palms get  pink also Not preventing her from doing anything. NOt sick with it  ? Cause  bp readings.ok taking lisinopril for a while . No supplements and no vitamins didn't take  Hormones given by gyne  After visit for moodiness and  Breast tenderness.  THesee episodes last Minutes  Warm Sensation.   No fever chills   ROS: See pertinent positives and negatives per HPI.  Past Medical History  Diagnosis Date  . HTN (hypertension)   . GERD (gastroesophageal reflux disease)   . Anxiety   . History of gallstones   . Obesity   . TMJ (temporomandibular joint disorder)   . ADJ DISORDER WITH MIXED ANXIETY & DEPRESSED MOOD 12/14/2009    Qualifier: Diagnosis of  By: Regis Bill MD, Standley Brooking   . Metrorrhagia 12/14/2009    Qualifier: Diagnosis of  By: Regis Bill MD, Standley Brooking With pelvic pain  Sees Dr Philis Pique and tobotic hysterectomy scheduled     Family History  Problem Relation Age of Onset  . Heart disease Mother 24    Died age 44, MI  . Heart attack Mother   . Hypertension Mother   . Hypertension Father   . Diabetes Father   . Heart disease Father   . Hyperlipidemia Neg Hx   . Sudden death Neg Hx     History   Social History  . Marital Status: Single    Spouse Name: N/A    Number of Children: 1  . Years of Education: N/A   Occupational History  . manager    Social History Main Topics  . Smoking status: Former Smoker -- 0.25 packs/day    Types: Cigarettes    Quit date: 01/22/2012  . Smokeless tobacco: Never Used  .  Alcohol Use: No  . Drug Use: No  . Sexual Activity: None   Other Topics Concern  . 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 azelastine (ASTELIN) 137 MCG/SPRAY nasal spray   . cyclobenzaprine (FLEXERIL) 10 MG  tablet Take 0.5 tablets (5 mg total) by mouth daily as needed. Muscle spasms  . fluticasone (FLONASE) 50 MCG/ACT nasal spray   . ibuprofen (ADVIL,MOTRIN) 800 MG tablet TAKE 1 TABLET BY MOUTH EVERY 8 HOURS AS NEEDED FOR CRAMPS  . lisinopril-hydrochlorothiazide (PRINZIDE,ZESTORETIC) 20-12.5 MG per tablet TAKE 1 TABLET BY MOUTH DAILY.  Marland Kitchen omeprazole (PRILOSEC) 20 MG capsule Take 1 capsule (20 mg total) by mouth daily. For heartburn    EXAM:  BP 132/76 mmHg  Temp(Src) 98.5 F (36.9 C) (Oral)  Wt 288 lb (130.636 kg)  Body mass index is 44.78 kg/(m^2).  GENERAL: vitals reviewed and listed above, alert, oriented, appears well hydrated and in no acute distress tops of ears flushed  As well as cheeks  And palms without rash  Looks well  HEENT: atraumatic, conjunctiva  clear, no obvious abnormalities on inspection of external nose and ears OP : no lesion edema or exudate  NECK: no obvious masses on inspection palpation  LUNGS: clear to auscultation bilaterally, no wheezes, rales or rhonchi, good air movement CV: HRRR, no clubbing cyanosis or  peripheral edema nl cap refill  MS: moves all extremities without noticeable focal  abnormality PSYCH: pleasant  and cooperative, no obvious depression or anxiety Lab Results  Component Value Date   WBC 5.8 04/01/2013   HGB 13.7 04/01/2013   HCT 40.7 04/01/2013   PLT 296.0 04/01/2013   GLUCOSE 89 09/08/2013   CHOL 143 04/01/2013   TRIG 81.0 04/01/2013   HDL 33.00* 04/01/2013   LDLCALC 94 04/01/2013   ALT 20 04/01/2013   AST 18 04/01/2013   NA 138 09/08/2013   K 3.6 09/08/2013   CL 100 09/08/2013   CREATININE 0.7 09/08/2013   BUN 10 09/08/2013   CO2 31 09/08/2013   TSH 1.30 04/01/2013   HGBA1C 6.1 09/08/2013    ASSESSMENT AND PLAN:  Discussed the following assessment and plan:  Flushing - uncertain cause but is well and no niacin supp. poss perimenopausal congrats on dc tobacco  Essential hypertension - appears controlled Last set of labs ok    Expectant management. Labs due march  On  Schedule congrats on tobacco free counseled about return i fprogresssive   Other worrisome sx  -Patient advised to return or notify health care team  if symptoms worsen ,persist or new concerns arise.  Patient Instructions  Uncertain what the flushing is but your exam is good and last labs were good also. Consider perimenopausal sx .   But can do  Labs tess sometimes help decide on this . Agree with exercise   For now . If  persistent or progressive then we can repeat som fo the lab tests and include the [facility] level.     Standley Brooking. Panosh M.D. Total visit 5mins > 50% spent counseling and coordinating care

## 2013-12-18 NOTE — Patient Instructions (Signed)
Uncertain what the flushing is but your exam is good and last labs were good also. Consider perimenopausal sx .   But can do  Labs tess sometimes help decide on this . Agree with exercise   For now . If  persistent or progressive then we can repeat som fo the lab tests and include the [facility] level.

## 2013-12-29 ENCOUNTER — Telehealth: Payer: Self-pay | Admitting: Internal Medicine

## 2013-12-29 NOTE — Telephone Encounter (Signed)
error 

## 2013-12-29 NOTE — Telephone Encounter (Signed)
[facility] Patient Name: Beth Wallace Gender: Female DOB: 10-09-1965 Age: 48 Y 42 M 9 D Return Phone Number: [phone] (Primary), [phone] (Secondary) Address: [address] City/State/Zip: [place] [address] Client [facility] - Client Client Site [facility] - Day Physician Shanon Ace Contact Type Call Call Type Triage / Clinical Relationship To Patient Self Return Phone Number [phone] (Primary) Chief Complaint Neck Pain Initial Comment Caller states she has a pinched nerve in her neck needs to know what MD she needs to see. PreDisposition Call Doctor Nurse Assessment Nurse: Marcelline Deist, RN, Kermit Balo Date/Time Eilene Ghazi Time): 12/29/2013 9:48:57 AM Confirm and document reason for call. If symptomatic, describe symptoms. ---Caller states she has a pinched nerve in her neck needs to know what MD she needs to see. Pain in back of neck into left arm/ shoulder, tingling in left hand, too. Has had this off/on for awhile. Flexaril & ice improves it. Has the patient traveled out of the country within the last 30 days? ---Not Applicable Does the patient require triage? ---Yes Related visit to physician within the last 2 weeks? ---No Does the PT have any chronic conditions? (i.e. diabetes, asthma, etc.) ---Yes List chronic conditions. ---BP rx. Did the patient indicate they were pregnant? ---No Guidelines Guideline Title Affirmed Question Affirmed Notes Nurse Date/Time Eilene Ghazi Time) Neck Pain or Stiffness Numbness in an arm or hand (i.e., loss of sensation) Marcelline Deist, RN, Lynda 12/29/2013 9:50:52 AM Disp. Time Eilene Ghazi Time) Disposition Final User 12/29/2013 9:55:10 AM See Physician within 24 Hours Yes Marcelline Deist, RN, Milana Kidney Understands: Yes Disagree/Comply: Comply PLEASE NOTE: All timestamps contained within this report are  represented as [place] Standard Time. CONFIDENTIALTY NOTICE: This fax transmission is intended only for the addressee. It contains information that is legally privileged, confidential or otherwise protected from use or disclosure. If you are not the intended recipient, you are strictly prohibited from reviewing, disclosing, copying using or disseminating any of this information or taking any action in reliance on or regarding this information. If you have received this fax in error, please notify us immediately by telephone so that we can arrange for its return to [name]. Phone: [phone], Toll-Free: [phone], Fax: [phone] Page: 2 of 2 Call Id: [id number] Care Advice Given Per Guideline SEE PHYSICIAN WITHIN 24 HOURS: * IF OFFICE WILL BE OPEN: You need to be examined within the next 24 hours. Call your doctor when the office opens, and make an appointment. PAIN MEDICINES: * For pain relief, take acetaminophen, ibuprofen, or naproxen. * Use the lowest amount that makes your pain feel better. CALL BACK IF: * You become worse. CARE ADVICE given per Neck Pain (Adult) guideline. After Care Instructions Given Call Event Type User Date / Time Description Comments User: Donald Siva, RN Date/Time Eilene Ghazi Time): 12/29/2013 9:56:37 AM Warm transferred to scheduling for appt. Advised that she can't be seen within 24 hours at office to be seen at walk-in clinic or UC & possibly referred to neurologist if this is a nerve issue. Referrals REFERRED TO PCP OFFICE

## 2013-12-30 ENCOUNTER — Ambulatory Visit (INDEPENDENT_AMBULATORY_CARE_PROVIDER_SITE_OTHER)
Admission: RE | Admit: 2013-12-30 | Discharge: 2013-12-30 | Disposition: A | Discharge status: Home / Self Care | Payer: 59 | Source: Ambulatory Visit | Attending: Family Medicine | Admitting: Family Medicine

## 2013-12-30 ENCOUNTER — Ambulatory Visit (INDEPENDENT_AMBULATORY_CARE_PROVIDER_SITE_OTHER): Payer: 59 | Admitting: Family Medicine

## 2013-12-30 ENCOUNTER — Encounter: Payer: Self-pay | Admitting: Family Medicine

## 2013-12-30 VITALS — BP 132/80 | HR 81 | Temp 97.9°F | Wt 278.0 lb

## 2013-12-30 DIAGNOSIS — M5412 Radiculopathy, cervical region: Secondary | ICD-10-CM

## 2013-12-30 MED ORDER — PREDNISONE 10 MG PO TABS: ORAL_TABLET | ORAL | Status: DC

## 2013-12-30 NOTE — Progress Notes (Signed)
   Subjective:    Patient ID: Beth Wallace, female    DOB: Sep 03, 1965, 48 y.o.   MRN: [id number]  HPI  patient seen as a work in with left neck and left upper shoulder pain progressive over 6 months. She denies any injury. She describes pain as dull to sharp and radiates down to hand at times. 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 Recent pains 7 out of 10 severity at it's worst. She takes some Flexeril and Motrin which helped and also icing to help somewhat. Denies ever experiencing upper extremity weakness. Occasional numbness involving the left hand. She is not sure which digits are involved. Her symptoms have been progressive over several months.  Past Medical History  Diagnosis Date  . HTN (hypertension)   . GERD (gastroesophageal reflux disease)   . Anxiety   . History of gallstones   . Obesity   . TMJ (temporomandibular joint disorder)   . ADJ DISORDER WITH MIXED ANXIETY & DEPRESSED MOOD 12/14/2009    Qualifier: Diagnosis of  By: Regis Bill MD, Standley Brooking   . Metrorrhagia 12/14/2009    Qualifier: Diagnosis of  By: Regis Bill MD, Standley Brooking With pelvic pain  Sees Dr Philis Pique and tobotic hysterectomy scheduled    Past Surgical History  Procedure Laterality Date  . Cholecystectomy  1989  . Cesarean section    . Wisdom tooth extraction  2010  . Abdominal hysterectomy  09/2010    partial    reports that she quit smoking about 23 months ago. Her smoking use included Cigarettes. She smoked 0.25 packs per day. She has never used smokeless tobacco. She reports that she does not drink alcohol or use illicit drugs. family history includes Diabetes in her father; Heart attack in her mother; Heart disease in her father; Heart disease (age of onset: 75) in her mother; Hypertension in her father and mother. There is no history of Hyperlipidemia or Sudden death. Allergies  Allergen Reactions  . Bee Venom Shortness Of Breath  . Contrast Media [Iodinated Diagnostic Agents]  Anaphylaxis    CT dye ivp Throat swelling      Review of Systems  HENT: Negative for trouble swallowing.   Respiratory: Negative for shortness of breath.   Cardiovascular: Negative for chest pain.  Gastrointestinal: Negative for abdominal pain.  Musculoskeletal: Positive for neck pain and neck stiffness.  Skin: Negative for rash.  Neurological: Positive for numbness. Negative for weakness.       Objective:   Physical Exam  Constitutional: She appears well-developed and well-nourished.  Cardiovascular: Normal rate and regular rhythm.   Pulmonary/Chest: Effort normal and breath sounds normal. No respiratory distress. She has no wheezes. She has no rales.  Musculoskeletal:  She has some pain with lateral bending and rotation to the left and right but fairly good range of motion cervical spine  Neurological:  Symmetric upper extremity reflexes. Full-strength upper extremities.          Assessment & Plan:   Left cervical radiculitis symptoms. Nonfocal exam neurologically. Recommend cervical spine films given duration. Prednisone taper and reviewed possible side effects. Pain is relatively mild most of time and she is not requesting additional pain medication. May need MRI to further assess

## 2013-12-30 NOTE — Progress Notes (Signed)
Pre visit review using our clinic review tool, if applicable. No additional management support is needed unless otherwise documented below in the visit note. 

## 2014-04-06 ENCOUNTER — Other Ambulatory Visit (INDEPENDENT_AMBULATORY_CARE_PROVIDER_SITE_OTHER): Payer: 59

## 2014-04-06 DIAGNOSIS — Z0000 Encounter for general adult medical examination without abnormal findings: Secondary | ICD-10-CM | POA: Diagnosis not present

## 2014-04-06 LAB — BASIC METABOLIC PANEL
BUN: 14 mg/dL (ref 6–23)
CO2: 31 mEq/L (ref 19–32)
Calcium: 9 mg/dL (ref 8.4–10.5)
Chloride: 102 mEq/L (ref 96–112)
Creatinine, Ser: 0.83 mg/dL (ref 0.40–1.20)
GFR: 94.88 mL/min (ref >60.00)
Glucose, Bld: 99 mg/dL (ref 70–99)
Potassium: 3.7 mEq/L (ref 3.5–5.1)
SODIUM: 138 meq/L (ref 135–145)

## 2014-04-06 LAB — HEPATIC FUNCTION PANEL
ALBUMIN: 3.9 g/dL (ref 3.5–5.2)
ALK PHOS: 59 U/L (ref 39–117)
ALT: 19 U/L (ref 0–35)
AST: 18 U/L (ref 0–37)
BILIRUBIN DIRECT: 0.1 mg/dL (ref 0.0–0.3)
Total Bilirubin: 0.5 mg/dL (ref 0.2–1.2)
Total Protein: 7 g/dL (ref 6.0–8.3)

## 2014-04-06 LAB — LIPID PANEL
Cholesterol: 152 mg/dL (ref 0–200)
HDL: 31.7 mg/dL — ABNORMAL LOW (ref >39.00)
LDL CALC: 103 mg/dL — AB (ref 0–99)
NonHDL: 120.3
Total CHOL/HDL Ratio: 5
Triglycerides: 89 mg/dL (ref 0.0–149.0)
VLDL: 17.8 mg/dL (ref 0.0–40.0)

## 2014-04-06 LAB — CBC WITH DIFFERENTIAL/PLATELET
BASOS PCT: 0.5 % (ref 0.0–3.0)
Basophils Absolute: 0 10*3/uL (ref 0.0–0.1)
EOS PCT: 1.7 % (ref 0.0–5.0)
Eosinophils Absolute: 0.1 10*3/uL (ref 0.0–0.7)
HEMATOCRIT: 38.2 % (ref 36.0–46.0)
HEMOGLOBIN: 13.2 g/dL (ref 12.0–15.0)
LYMPHS ABS: 1.9 10*3/uL (ref 0.7–4.0)
Lymphocytes Relative: 36.2 % (ref 12.0–46.0)
MCHC: 34.6 g/dL (ref 30.0–36.0)
MCV: 86.2 fl (ref 78.0–100.0)
MONOS PCT: 6.5 % (ref 3.0–12.0)
Monocytes Absolute: 0.3 10*3/uL (ref 0.1–1.0)
NEUTROS ABS: 3 10*3/uL (ref 1.4–7.7)
Neutrophils Relative %: 55.1 % (ref 43.0–77.0)
Platelets: 300 10*3/uL (ref 150.0–400.0)
RBC: 4.43 Mil/uL (ref 3.87–5.11)
RDW: 14.8 % (ref 11.5–15.5)
WBC: 5.4 10*3/uL (ref 4.0–10.5)

## 2014-04-06 LAB — TSH: TSH: 1.77 u[IU]/mL (ref 0.35–4.50)

## 2014-04-13 ENCOUNTER — Telehealth: Payer: Self-pay | Admitting: Internal Medicine

## 2014-04-13 ENCOUNTER — Ambulatory Visit (INDEPENDENT_AMBULATORY_CARE_PROVIDER_SITE_OTHER): Payer: 59 | Admitting: Internal Medicine

## 2014-04-13 ENCOUNTER — Encounter: Payer: Self-pay | Admitting: Internal Medicine

## 2014-04-13 VITALS — BP 138/78 | Temp 98.2°F | Ht 67.0 in | Wt 286.7 lb

## 2014-04-13 DIAGNOSIS — R51 Headache: Secondary | ICD-10-CM

## 2014-04-13 DIAGNOSIS — K219 Gastro-esophageal reflux disease without esophagitis: Secondary | ICD-10-CM

## 2014-04-13 DIAGNOSIS — Z0000 Encounter for general adult medical examination without abnormal findings: Secondary | ICD-10-CM | POA: Diagnosis not present

## 2014-04-13 DIAGNOSIS — I10 Essential (primary) hypertension: Secondary | ICD-10-CM

## 2014-04-13 DIAGNOSIS — F439 Reaction to severe stress, unspecified: Secondary | ICD-10-CM

## 2014-04-13 DIAGNOSIS — E786 Lipoprotein deficiency: Secondary | ICD-10-CM

## 2014-04-13 DIAGNOSIS — R519 Headache, unspecified: Secondary | ICD-10-CM

## 2014-04-13 NOTE — Progress Notes (Signed)
Pre visit review using our clinic review tool, if applicable. No additional management support is needed unless otherwise documented below in the visit note.  Chief Complaint  Patient presents with  . Annual Exam    HPI: Patient  Beth Wallace  49 y.o. comes in today for Preventive Health Care visit  And Chronic disease management  Not exercising works many hours sedentary at home . Know needs to exercise  Has allergy seeing allergist gets ha top of head scalp and neck area  Has tmj no osa  Bp med taking  Seems ok. Getting ocass anxiety work has  afew left over xanax  ? Varicella immunity didn't have but sibs had  Vit d defic now on 2000 per day ? Lab level?   Health Maintenance  Topic Date Due  . HIV Screening  03/16/2015 (Originally 07/21/1980)  . INFLUENZA VACCINE  08/16/2014  . MAMMOGRAM  08/16/2014  . PAP SMEAR  09/08/2016  . TETANUS/TDAP  05/16/2019   Health Maintenance Review LIFESTYLE:  Exercise:   no Tobacco/ETS: ocass  Alcohol:  no Sugar beverages:  No Sleep:   5-6  Hours  Drug use: no Colonoscopy:  PAP:  utd this year.  MAMMO:done   ROS:  GEN/ HEENT: No fever, significant weight changes sweats  vision problems hearing changes, CV/ PULM; No chest pain shortness of breath cough, syncope,edema  change in exercise tolerance. GI /GU: No adominal pain, vomiting, change in bowel habits. No blood in the stool. No significant GU symptoms. SKIN/HEME: ,no acute skin rashes suspicious lesions or bleeding. No lymphadenopathy, nodules, masses.  NEURO/ PSYCH:  No neurologic signs such as weakness numbness. No depression anxiety. IMM/ Allergy: No unusual infections.  Allergy .   REST of 12 system review negative except as per HPI   Past Medical History  Diagnosis Date  . HTN (hypertension)   . GERD (gastroesophageal reflux disease)   . Anxiety   . History of gallstones   . Obesity   . TMJ (temporomandibular joint disorder)   . ADJ DISORDER WITH MIXED ANXIETY &  DEPRESSED MOOD 12/14/2009    Qualifier: Diagnosis of  By: Regis Bill MD, Standley Brooking   . Metrorrhagia 12/14/2009    Qualifier: Diagnosis of  By: Regis Bill MD, Standley Brooking With pelvic pain  Sees Dr Philis Pique and tobotic hysterectomy scheduled     Past Surgical History  Procedure Laterality Date  . Cholecystectomy  1989  . Cesarean section    . Wisdom tooth extraction  2010  . Abdominal hysterectomy  09/2010    partial    Family History  Problem Relation Age of Onset  . Heart disease Mother 57    Died age 35, MI  . Heart attack Mother   . Hypertension Mother   . Hypertension Father   . Diabetes Father   . Heart disease Father   . Hyperlipidemia Neg Hx   . Sudden death Neg Hx     History   Social History  . Marital Status: Single    Spouse Name: N/A  . Number of Children: 1  . Years of Education: N/A   Occupational History  . manager    Social History Main Topics  . Smoking status: Former Smoker -- 0.25 packs/day    Types: Cigarettes    Quit date: 01/22/2012  . Smokeless tobacco: Never Used  . Alcohol Use: No  . Drug Use: No  . Sexual Activity: Not on file   Other Topics Concern  . 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 azelastine (ASTELIN) 137 MCG/SPRAY nasal spray   . cyclobenzaprine (FLEXERIL) 10 MG tablet Take 0.5 tablets (5 mg total) by mouth daily as needed. Muscle spasms  . ibuprofen (ADVIL,MOTRIN) 800 MG tablet TAKE 1 TABLET BY MOUTH  EVERY 8 HOURS AS NEEDED FOR CRAMPS  . lisinopril-hydrochlorothiazide (PRINZIDE,ZESTORETIC) 20-12.5 MG per tablet TAKE 1 TABLET BY MOUTH DAILY.  Marland Kitchen omeprazole (PRILOSEC) 20 MG capsule Take 1 capsule (20 mg total) by mouth daily. For heartburn  . [DISCONTINUED] fluticasone (FLONASE) 50 MCG/ACT nasal spray   . [DISCONTINUED] predniSONE (DELTASONE) 10 MG tablet Taper as follows: 4-4-4-3-3-2-2-1-1    EXAM:  BP 138/78 mmHg  Temp(Src) 98.2 F (36.8 C) (Oral)  Ht 5\' 7"  (1.702 m)  Wt 286 lb 11.2 oz (130.046 kg)  BMI 44.89 kg/m2  Body mass index is 44.89 kg/(m^2).  Physical Exam: Vital signs reviewed QTM:AUQJ is a well-developed well-nourished alert cooperative    who appearsr stated age in no acute distress. Looks mildly allergic HEENT: normocephalic atraumatic , Eyes: PERRL EOM's full, conjunctiva clear, Nares: paten,t no deformity discharge or tenderness., Ears: no deformity EAC's clear TMs with normal landmarks. Mouth: clear OP, no lesions, edema.  Moist mucous membranes. Dentition in adequate repair. NECK: supple without masses, thyromegaly or bruits. CHEST/PULM:  Clear to auscultation and percussion breath sounds equal no wheeze , rales or rhonchi. No chest wall deformities or tenderness. Breast: normal by inspection . No dimpling, discharge, masses, tenderness or discharge . CV: PMI is nondisplaced, S1 S2 no gallops, murmurs, rubs. Peripheral pulses are full without delay.No JVD .  ABDOMEN: Bowel sounds normal nontender  No guard or rebound, no hepato splenomegal no CVA tenderness.  No hernia. Extremtities:  No clubbing cyanosis or edema, no acute joint swelling or redness no focal atrophy NEURO:  Oriented x3, cranial nerves 3-12 appear to be intact, no obvious focal weakness,gait within normal limits no abnormal reflexes or asymmetrical SKIN: No acute rashes normal turgor, color, no bruising or petechiae. PSYCH: Oriented, good eye contact, no obvious depression anxiety, cognition and  judgment appear normal. LN: no cervical axillary inguinal adenopathy  Lab Results  Component Value Date   WBC 5.4 04/06/2014   HGB 13.2 04/06/2014   HCT 38.2 04/06/2014   PLT 300.0 04/06/2014   GLUCOSE 99 04/06/2014   CHOL 152 04/06/2014   TRIG 89.0 04/06/2014   HDL 31.70* 04/06/2014   LDLCALC 103* 04/06/2014   ALT 19 04/06/2014   AST 18 04/06/2014   NA 138 04/06/2014   K 3.7 04/06/2014   CL 102 04/06/2014   CREATININE  0.83 04/06/2014   BUN 14 04/06/2014   CO2 31 04/06/2014   TSH 1.77 04/06/2014   HGBA1C 6.1 09/08/2013   Wt Readings from Last 3 Encounters:  04/13/14 286 lb 11.2 oz (130.046 kg)  12/30/13 278 lb (126.1 kg)  12/18/13 288 lb (130.636 kg)   BP: 138/78 mmHg  BP Readings from Last 3 Encounters:  04/13/14 138/78  12/30/13 132/80  12/18/13 132/76    ASSESSMENT AND PLAN:  Discussed the following assessment and plan:  Visit for preventive health examination  Essential hypertension  Low HDL (under 40)  Severe obesity (BMI >= 40)  Gastroesophageal reflux disease without esophagitis  Stress  Headache, unspecified headache type - poss from neck and postural etc  counseling Disc stress adaptation counseling call if feels med needed .  Time over routine preventinve counseling Stop all tobacco even ocassional. Patient Care Team: Burnis Medin, MD as PCP - General Bobbye Charleston, MD (Obstetrics and Gynecology) Ladene Artist, MD as Attending Physician (Gastroenterology) Patient Instructions      Take blood pressure readings twice a day for 10 - 14  days and then periodically .To ensure below 140/90   .Send in readings    .  If elevated we can change  bp doseage  Headache could be coming from neck and tmj and posture . Reassess work station. Get sleep . Stretch  Like yoga and activity in day walking  Etc.   Can do chicken pox  Serology at next lab draw but you are probably immunie, Can tgake vit d 800 - 1000 vit d per day . Check vit d at next  lab test because you had a hx of vit d deficiency.   Healthy lifestyle includes : At least 150 minutes of exercise weeks  , weight at healthy levels, which is usually   BMI 19-25. Avoid trans fats and processed foods;  Increase fresh fruits and veges to 5 servings per day. And avoid sweet beverages including tea and juice. Mediterranean diet with olive oil and nuts have been noted to be heart and brain healthy . Avoid tobacco products . Limit  alcohol to  7 per week for women and 14 servings for men.  Get adequate sleep . Wear seat belts . Don't text and drive .      Why follow it? Research shows. . Those who follow the Mediterranean diet have a reduced risk of heart disease  . The diet is associated with a reduced incidence of Parkinson's and Alzheimer's diseases . People following the diet may have longer life expectancies and lower rates of chronic diseases  . The Dietary Guidelines for Americans recommends the Mediterranean diet as an eating plan to promote health and prevent disease  What Is the Mediterranean Diet?  . Healthy eating plan based on typical foods and recipes of Mediterranean-style cooking . The diet is primarily a plant based diet; these foods should make up a majority of meals   Starches - Plant based foods should make up a majority of meals - They are an important sources of vitamins, minerals, energy, antioxidants, and fiber - Choose whole grains, foods high in fiber and minimally processed items  - Typical grain sources include wheat, oats, barley, corn, brown rice, bulgar, farro, millet, polenta, couscous  - Various types of beans include chickpeas, lentils, fava beans, black beans, white beans   Fruits  Veggies - Large quantities of antioxidant rich fruits & veggies; 6 or more servings  - Vegetables can  be eaten raw or lightly drizzled with oil and cooked  - Vegetables common to the traditional Mediterranean Diet include: artichokes, arugula, beets, broccoli,  brussel sprouts, cabbage, carrots, celery, collard greens, cucumbers, eggplant, kale, leeks, lemons, lettuce, mushrooms, okra, onions, peas, peppers, potatoes, pumpkin, radishes, rutabaga, shallots, spinach, sweet potatoes, turnips, zucchini - Fruits common to the Mediterranean Diet include: apples, apricots, avocados, cherries, clementines, dates, figs, grapefruits, grapes, melons, nectarines, oranges, peaches, pears, pomegranates, strawberries, tangerines  Fats - Replace butter and margarine with healthy oils, such as olive oil, canola oil, and tahini  - Limit nuts to no more than a handful a day  - Nuts include walnuts, almonds, pecans, pistachios, pine nuts  - Limit or avoid candied, honey roasted or heavily salted nuts - Olives are central to the [employer] - can be eaten whole or used in a variety of dishes   Meats Protein - Limiting red meat: no more than a few times a month - When eating red meat: choose lean cuts and keep the portion to the size of deck of cards - Eggs: approx. 0 to 4 times a week  - Fish and lean poultry: at least 2 a week  - Healthy protein sources include, chicken, [place], lean beef, lamb - Increase intake of seafood such as tuna, salmon, trout, mackerel, shrimp, scallops - Avoid or limit high fat processed meats such as sausage and bacon  Dairy - Include moderate amounts of low fat dairy products  - Focus on healthy dairy such as fat free yogurt, skim milk, low or reduced fat cheese - Limit dairy products higher in fat such as whole or 2% milk, cheese, ice cream  Alcohol - Moderate amounts of red wine is ok  - No more than 5 oz daily for women (all ages) and men older than age 21  - No more than 10 oz of wine daily for men younger than 37  Other - Limit sweets and other desserts  - Use herbs and spices instead of salt to flavor foods  - Herbs and spices common to the traditional Mediterranean Diet include: basil, bay leaves, chives, cloves, cumin, fennel,  garlic, lavender, marjoram, mint, oregano, parsley, pepper, rosemary, sage, savory, sumac, tarragon, thyme   It's not just a diet, it's a lifestyle:  . The Mediterranean diet includes lifestyle factors typical of those in the region  . Foods, drinks and meals are best eaten with others and savored . Daily physical activity is important for overall good health . This could be strenuous exercise like running and aerobics . This could also be more leisurely activities such as walking, housework, yard-work, or taking the stairs . Moderation is the key; a balanced and healthy diet accommodates most foods and drinks . Consider portion sizes and frequency of consumption of certain foods   Meal Ideas & Options:  . Breakfast:  o Whole wheat toast or whole wheat English muffins with peanut butter & hard boiled egg o Steel cut oats topped with apples & cinnamon and skim milk  o Fresh fruit: banana, strawberries, melon, berries, peaches  o Smoothies: strawberries, bananas, greek yogurt, peanut butter o Low fat greek yogurt with blueberries and granola  o Egg white omelet with spinach and mushrooms o Breakfast couscous: whole wheat couscous, apricots, skim milk, cranberries  . Sandwiches:  o Hummus and grilled vegetables (peppers, zucchini, squash) on whole wheat bread   o Grilled chicken on whole wheat pita with lettuce, tomatoes, cucumbers or tzatziki  o  Tuna salad on whole wheat bread: tuna salad made with greek yogurt, olives, red peppers, capers, green onions o Garlic rosemary lamb pita: lamb sauted with garlic, rosemary, salt & pepper; add lettuce, cucumber, greek yogurt to pita - flavor with lemon juice and black pepper  . Seafood:  o Mediterranean grilled salmon, seasoned with garlic, basil, parsley, lemon juice and black pepper o Shrimp, lemon, and spinach whole-grain pasta salad made with low fat greek yogurt  o Seared scallops with lemon orzo  o Seared tuna steaks seasoned salt, pepper,  coriander topped with tomato mixture of olives, tomatoes, olive oil, minced garlic, parsley, green onions and cappers  . Meats:  o Herbed greek chicken salad with kalamata olives, cucumber, feta  o Red bell peppers stuffed with spinach, bulgur, lean ground beef (or lentils) & topped with feta   o Kebabs: skewers of chicken, tomatoes, onions, zucchini, squash  o [place] burgers: made with red onions, mint, dill, lemon juice, feta cheese topped with roasted red peppers . Vegetarian o Cucumber salad: cucumbers, artichoke hearts, celery, red onion, feta cheese, tossed in olive oil & lemon juice  o Hummus and whole grain pita points with a greek salad (lettuce, tomato, feta, olives, cucumbers, red onion) o Lentil soup with celery, carrots made with vegetable broth, garlic, salt and pepper  o Tabouli salad: parsley, bulgur, mint, scallions, cucumbers, tomato, radishes, lemon juice, olive oil, salt and pepper.          Standley Brooking. Slayden Mennenga M.D.

## 2014-04-13 NOTE — Telephone Encounter (Signed)
emmi emailed °

## 2014-04-13 NOTE — Patient Instructions (Signed)
Take blood pressure readings twice a day for 10 - 14  days and then periodically .To ensure below 140/90   .Send in readings    .  If elevated we can change  bp doseage  Headache could be coming from neck and tmj and posture . Reassess work station. Get sleep . Stretch  Like yoga and activity in day walking  Etc.   Can do chicken pox  Serology at next lab draw but you are probably immunie, Can tgake vit d 800 - 1000 vit d per day . Check vit d at next lab test because you had a hx of vit d deficiency.   Healthy lifestyle includes : At least 150 minutes of exercise weeks  , weight at healthy levels, which is usually   BMI 19-25. Avoid trans fats and processed foods;  Increase fresh fruits and veges to 5 servings per day. And avoid sweet beverages including tea and juice. Mediterranean diet with olive oil and nuts have been noted to be heart and brain healthy . Avoid tobacco products . Limit  alcohol to  7 per week for women and 14 servings for men.  Get adequate sleep . Wear seat belts . Don't text and drive .      Why follow it? Research shows. . Those who follow the Mediterranean diet have a reduced risk of heart disease  . The diet is associated with a reduced incidence of Parkinson's and Alzheimer's diseases . People following the diet may have longer life expectancies and lower rates of chronic diseases  . The Dietary Guidelines for Americans recommends the Mediterranean diet as an eating plan to promote health and prevent disease  What Is the Mediterranean Diet?  . Healthy eating plan based on typical foods and recipes of Mediterranean-style cooking . The diet is primarily a plant based diet; these foods should make up a majority of meals   Starches - Plant based foods should make up a majority of meals - They are an important sources of vitamins, minerals, energy, antioxidants, and fiber - Choose whole grains, foods high in fiber and minimally processed items  - Typical  grain sources include wheat, oats, barley, corn, brown rice, bulgar, farro, millet, polenta, couscous  - Various types of beans include chickpeas, lentils, fava beans, black beans, white beans   Fruits  Veggies - Large quantities of antioxidant rich fruits & veggies; 6 or more servings  - Vegetables can be eaten raw or lightly drizzled with oil and cooked  - Vegetables common to the traditional Mediterranean Diet include: artichokes, arugula, beets, broccoli, brussel sprouts, cabbage, carrots, celery, collard greens, cucumbers, eggplant, kale, leeks, lemons, lettuce, mushrooms, okra, onions, peas, peppers, potatoes, pumpkin, radishes, rutabaga, shallots, spinach, sweet potatoes, turnips, zucchini - Fruits common to the Mediterranean Diet include: apples, apricots, avocados, cherries, clementines, dates, figs, grapefruits, grapes, melons, nectarines, oranges, peaches, pears, pomegranates, strawberries, tangerines  Fats - Replace butter and margarine with healthy oils, such as olive oil, canola oil, and tahini  - Limit nuts to no more than a handful a day  - Nuts include walnuts, almonds, pecans, pistachios, pine nuts  - Limit or avoid candied, honey roasted or heavily salted nuts - Olives are central to the [employer] - can be eaten whole or used in a variety of dishes   Meats Protein - Limiting red meat: no more than a few times a month - When eating red meat: choose lean cuts and keep the portion  to the size of deck of cards - Eggs: approx. 0 to 4 times a week  - Fish and lean poultry: at least 2 a week  - Healthy protein sources include, chicken, [place], lean beef, lamb - Increase intake of seafood such as tuna, salmon, trout, mackerel, shrimp, scallops - Avoid or limit high fat processed meats such as sausage and bacon  Dairy - Include moderate amounts of low fat dairy products  - Focus on healthy dairy such as fat free yogurt, skim milk, low or reduced fat cheese - Limit dairy  products higher in fat such as whole or 2% milk, cheese, ice cream  Alcohol - Moderate amounts of red wine is ok  - No more than 5 oz daily for women (all ages) and men older than age 84  - No more than 10 oz of wine daily for men younger than 37  Other - Limit sweets and other desserts  - Use herbs and spices instead of salt to flavor foods  - Herbs and spices common to the traditional Mediterranean Diet include: basil, bay leaves, chives, cloves, cumin, fennel, garlic, lavender, marjoram, mint, oregano, parsley, pepper, rosemary, sage, savory, sumac, tarragon, thyme   It's not just a diet, it's a lifestyle:  . The Mediterranean diet includes lifestyle factors typical of those in the region  . Foods, drinks and meals are best eaten with others and savored . Daily physical activity is important for overall good health . This could be strenuous exercise like running and aerobics . This could also be more leisurely activities such as walking, housework, yard-work, or taking the stairs . Moderation is the key; a balanced and healthy diet accommodates most foods and drinks . Consider portion sizes and frequency of consumption of certain foods   Meal Ideas & Options:  . Breakfast:  o Whole wheat toast or whole wheat English muffins with peanut butter & hard boiled egg o Steel cut oats topped with apples & cinnamon and skim milk  o Fresh fruit: banana, strawberries, melon, berries, peaches  o Smoothies: strawberries, bananas, greek yogurt, peanut butter o Low fat greek yogurt with blueberries and granola  o Egg white omelet with spinach and mushrooms o Breakfast couscous: whole wheat couscous, apricots, skim milk, cranberries  . Sandwiches:  o Hummus and grilled vegetables (peppers, zucchini, squash) on whole wheat bread   o Grilled chicken on whole wheat pita with lettuce, tomatoes, cucumbers or tzatziki  o Tuna salad on whole wheat bread: tuna salad made with greek yogurt, olives, red  peppers, capers, green onions o Garlic rosemary lamb pita: lamb sauted with garlic, rosemary, salt & pepper; add lettuce, cucumber, greek yogurt to pita - flavor with lemon juice and black pepper  . Seafood:  o Mediterranean grilled salmon, seasoned with garlic, basil, parsley, lemon juice and black pepper o Shrimp, lemon, and spinach whole-grain pasta salad made with low fat greek yogurt  o Seared scallops with lemon orzo  o Seared tuna steaks seasoned salt, pepper, coriander topped with tomato mixture of olives, tomatoes, olive oil, minced garlic, parsley, green onions and cappers  . Meats:  o Herbed greek chicken salad with kalamata olives, cucumber, feta  o Red bell peppers stuffed with spinach, bulgur, lean ground beef (or lentils) & topped with feta   o Kebabs: skewers of chicken, tomatoes, onions, zucchini, squash  o [place] burgers: made with red onions, mint, dill, lemon juice, feta cheese topped with roasted red peppers . Vegetarian o Cucumber salad:  cucumbers, artichoke hearts, celery, red onion, feta cheese, tossed in olive oil & lemon juice  o Hummus and whole grain pita points with a greek salad (lettuce, tomato, feta, olives, cucumbers, red onion) o Lentil soup with celery, carrots made with vegetable broth, garlic, salt and pepper  o Tabouli salad: parsley, bulgur, mint, scallions, cucumbers, tomato, radishes, lemon juice, olive oil, salt and pepper.

## 2014-05-25 ENCOUNTER — Encounter (HOSPITAL_COMMUNITY): Payer: Self-pay | Admitting: Emergency Medicine

## 2014-05-25 ENCOUNTER — Emergency Department (HOSPITAL_COMMUNITY)
Admission: EM | Admit: 2014-05-25 | Discharge: 2014-05-25 | Disposition: A | Discharge status: Home / Self Care | Payer: 59 | Attending: Emergency Medicine | Admitting: Emergency Medicine

## 2014-05-25 DIAGNOSIS — J0111 Acute recurrent frontal sinusitis: Secondary | ICD-10-CM

## 2014-05-25 DIAGNOSIS — Z8742 Personal history of other diseases of the female genital tract: Secondary | ICD-10-CM | POA: Diagnosis not present

## 2014-05-25 DIAGNOSIS — E669 Obesity, unspecified: Secondary | ICD-10-CM | POA: Insufficient documentation

## 2014-05-25 DIAGNOSIS — Z8659 Personal history of other mental and behavioral disorders: Secondary | ICD-10-CM | POA: Insufficient documentation

## 2014-05-25 DIAGNOSIS — H53149 Visual discomfort, unspecified: Secondary | ICD-10-CM | POA: Insufficient documentation

## 2014-05-25 DIAGNOSIS — J321 Chronic frontal sinusitis: Secondary | ICD-10-CM | POA: Diagnosis not present

## 2014-05-25 DIAGNOSIS — Z79899 Other long term (current) drug therapy: Secondary | ICD-10-CM | POA: Diagnosis not present

## 2014-05-25 DIAGNOSIS — K219 Gastro-esophageal reflux disease without esophagitis: Secondary | ICD-10-CM | POA: Insufficient documentation

## 2014-05-25 DIAGNOSIS — Z87891 Personal history of nicotine dependence: Secondary | ICD-10-CM | POA: Diagnosis not present

## 2014-05-25 DIAGNOSIS — R51 Headache: Secondary | ICD-10-CM | POA: Diagnosis present

## 2014-05-25 DIAGNOSIS — I10 Essential (primary) hypertension: Secondary | ICD-10-CM | POA: Diagnosis not present

## 2014-05-25 MED ORDER — DIPHENHYDRAMINE HCL 25 MG PO CAPS
25.0000 mg | ORAL_CAPSULE | Freq: Once | ORAL | Status: AC
  Administered 2014-05-25: 25 mg via ORAL
  Filled 2014-05-25: qty 1

## 2014-05-25 MED ORDER — AMOXICILLIN-POT CLAVULANATE 875-125 MG PO TABS: 1.0000 | ORAL_TABLET | Freq: Two times a day (BID) | ORAL | Status: DC

## 2014-05-25 MED ORDER — METOCLOPRAMIDE HCL 5 MG/ML IJ SOLN
10.0000 mg | Freq: Once | INTRAMUSCULAR | Status: AC
  Administered 2014-05-25: 10 mg via INTRAMUSCULAR
  Filled 2014-05-25: qty 2

## 2014-05-25 MED ORDER — KETOROLAC TROMETHAMINE 60 MG/2ML IM SOLN
60.0000 mg | Freq: Once | INTRAMUSCULAR | Status: AC
  Administered 2014-05-25: 60 mg via INTRAMUSCULAR
  Filled 2014-05-25: qty 2

## 2014-05-25 MED ORDER — PREDNISONE 10 MG PO TABS: ORAL_TABLET | ORAL | Status: DC

## 2014-05-25 NOTE — Discharge Instructions (Signed)
Sinusitis °Sinusitis is redness, soreness, and puffiness (inflammation) of the air pockets in the bones of your face (sinuses). The redness, soreness, and puffiness can cause air and mucus to get trapped in your sinuses. This can allow germs to grow and cause an infection.  °HOME CARE  °· Drink enough fluids to keep your pee (urine) clear or pale yellow. °· Use a humidifier in your home. °· Run a hot shower to create steam in the bathroom. Sit in the bathroom with the door closed. Breathe in the steam 3-4 times a day. °· Put a warm, moist washcloth on your face 3-4 times a day, or as told by your doctor. °· Use salt water sprays (saline sprays) to wet the thick fluid in your nose. This can help the sinuses drain. °· Only take medicine as told by your doctor. °GET HELP RIGHT AWAY IF:  °· Your pain gets worse. °· You have very bad headaches. °· You are sick to your stomach (nauseous). °· You throw up (vomit). °· You are very sleepy (drowsy) all the time. °· Your face is puffy (swollen). °· Your vision changes. °· You have a stiff neck. °· You have trouble breathing. °MAKE SURE YOU:  °· Understand these instructions. °· Will watch your condition. °· Will get help right away if you are not doing well or get worse. °Document Released: 06/20/2007 Document Revised: 09/26/2011 Document Reviewed: 08/07/2011 °Exit[employer]® Patient Information ©2015 [employer]. This information is not intended to replace advice given to you by your health care provider. Make sure you discuss any questions you have with your health care provider. ° °

## 2014-05-25 NOTE — ED Notes (Signed)
Patient with c/o facial pain/pressure. H/o allergies and chronic sinusitis. States steamy shower will alleviate some of the pain temporarily. No other complaints.

## 2014-05-27 NOTE — ED Provider Notes (Signed)
CSN: [id number]     Arrival date & time 05/25/14  1747 History   First MD Initiated Contact with Patient 05/25/14 1819     Chief Complaint  Patient presents with  . Facial Pain     (Consider location/radiation/quality/duration/timing/severity/associated sxs/prior Treatment) HPI  Beth Wallace is a 49 y.o. female who presents to the Emergency Department complaining of facial pain and sinus pressure for several days.  She reports facial pain feels like pressure behind her eyes and front of her face.  Also reports a throbbing frontal headache of gradual onset.  She states that she has frequent issues with allergies and sinus infections.  She states her symptoms improve with steam and nettie pot use.  She denies vomiting, fever, visual changes, neck pain or stiffness, or dizziness.     Past Medical History  Diagnosis Date  . HTN (hypertension)   . GERD (gastroesophageal reflux disease)   . Anxiety   . History of gallstones   . Obesity   . TMJ (temporomandibular joint disorder)   . ADJ DISORDER WITH MIXED ANXIETY & DEPRESSED MOOD 12/14/2009    Qualifier: Diagnosis of  By: Regis Bill MD, Standley Brooking   . Metrorrhagia 12/14/2009    Qualifier: Diagnosis of  By: Regis Bill MD, Standley Brooking With pelvic pain  Sees Dr Philis Pique and tobotic hysterectomy scheduled    Past Surgical History  Procedure Laterality Date  . Cholecystectomy  1989  . Cesarean section    . Wisdom tooth extraction  2010  . Abdominal hysterectomy  09/2010    partial   Family History  Problem Relation Age of Onset  . Heart disease Mother 32    Died age 67, MI  . Heart attack Mother   . Hypertension Mother   . Hypertension Father   . Diabetes Father   . Heart disease Father   . Hyperlipidemia Neg Hx   . Sudden death Neg Hx    History  Substance Use Topics  . Smoking status: Former Smoker -- 0.25 packs/day    Types: Cigarettes    Quit date: 01/22/2012  . Smokeless tobacco: Never Used  . Alcohol Use: No   OB History     Gravida Para Term Preterm AB TAB SAB Ectopic Multiple Living   2 1 1        0     Review of Systems  Constitutional: Negative for fever, activity change and appetite change.  HENT: Positive for congestion and sinus pressure. Negative for facial swelling, sore throat and trouble swallowing.   Eyes: Positive for photophobia. Negative for pain and visual disturbance.  Respiratory: Negative for shortness of breath.   Cardiovascular: Negative for chest pain.  Gastrointestinal: Negative for nausea, vomiting and abdominal pain.  Musculoskeletal: Negative for arthralgias, neck pain and neck stiffness.  Skin: Negative for rash and wound.  Neurological: Positive for headaches. Negative for dizziness, syncope, facial asymmetry, speech difficulty, weakness and numbness.  Psychiatric/Behavioral: Negative for confusion and decreased concentration.  All other systems reviewed and are negative.     Allergies  Bee venom and Contrast media  Home Medications   Prior to Admission medications   Medication Sig Start Date End Date Taking? Authorizing Provider  ibuprofen (ADVIL,MOTRIN) 800 MG tablet TAKE 1 TABLET BY MOUTH EVERY 8 HOURS AS NEEDED FOR CRAMPS Patient taking differently: Take 800 mg by mouth every 8 (eight) hours as needed.  04/08/13  Yes Burnis Medin, MD  lisinopril-hydrochlorothiazide (PRINZIDE,ZESTORETIC) 20-12.5 MG per tablet TAKE 1 TABLET BY MOUTH DAILY.  04/08/13  Yes Burnis Medin, MD  omeprazole (PRILOSEC) 20 MG capsule Take 1 capsule (20 mg total) by mouth daily. For heartburn 09/07/13  Yes Ladene Artist, MD  amoxicillin-clavulanate (AUGMENTIN) 875-125 MG per tablet Take 1 tablet by mouth 2 (two) times daily. For 7 days 05/25/14   Biridiana Twardowski, PA-C  cyclobenzaprine (FLEXERIL) 10 MG tablet Take 0.5 tablets (5 mg total) by mouth daily as needed. Muscle spasms Patient not taking: Reported on 05/25/2014 04/08/13   Burnis Medin, MD  predniSONE (DELTASONE) 10 MG tablet Take 6 tablets day  one, 5 tablets day two, 4 tablets day three, 3 tablets day four, 2 tablets day five, then 1 tablet day six 05/25/14   Cathey Fredenburg, PA-C   BP 135/75 mmHg  Pulse 77  Temp(Src) 98.6 F (37 C) (Oral)  Resp 16  Ht 5' 7.5" (1.715 m)  Wt 280 lb (127.007 kg)  BMI 43.18 kg/m2  SpO2 99% Physical Exam  Constitutional: She is oriented to person, place, and time. She appears well-developed and well-nourished. No distress.  HENT:  Head: Normocephalic and atraumatic.  Mouth/Throat: Oropharynx is clear and moist.  Eyes: Conjunctivae and EOM are normal. Pupils are equal, round, and reactive to light.  Neck: Normal range of motion, full passive range of motion without pain and phonation normal. Neck supple. No Kernig's sign noted.  Cardiovascular: Normal rate, regular rhythm, normal heart sounds and intact distal pulses.   No murmur heard. Pulmonary/Chest: Effort normal and breath sounds normal. No respiratory distress.  Musculoskeletal: Normal range of motion.  Lymphadenopathy:    She has no cervical adenopathy.  Neurological: She is alert and oriented to person, place, and time. She has normal strength. She exhibits normal muscle tone. Coordination normal.  Reflex Scores:      Tricep reflexes are 2+ on the right side and 2+ on the left side.      Bicep reflexes are 2+ on the right side and 2+ on the left side. Skin: Skin is warm and dry.  Psychiatric: She has a normal mood and affect.  Nursing note and vitals reviewed.   ED Course  Procedures (including critical care time) Labs Review Labs Reviewed - No data to display  Imaging Review No results found.   EKG Interpretation None      MDM   Final diagnoses:  Recurrent frontal sinusitis, unspecified chronicity    Pt is well appearing.  VSS.  Non toxic appearing.  No meningismus.  Likely headache related to sinusitis.    Pt is feeling better after the medications and states she is ready for d/c.  Pt agrees to PMD f/u if needed.       Bufford Lope 05/27/14 2234  Virgel Manifold, MD 05/28/14 [phone]

## 2014-05-30 ENCOUNTER — Other Ambulatory Visit: Payer: Self-pay | Admitting: Internal Medicine

## 2014-06-02 ENCOUNTER — Encounter: Payer: Self-pay | Admitting: Internal Medicine

## 2014-06-03 ENCOUNTER — Other Ambulatory Visit: Payer: Self-pay | Admitting: Family Medicine

## 2014-06-03 DIAGNOSIS — Z7689 Persons encountering health services in other specified circumstances: Secondary | ICD-10-CM

## 2014-06-03 NOTE — Telephone Encounter (Signed)
Please arrange neurology  Consult. Not certain of the dx .

## 2014-06-16 ENCOUNTER — Other Ambulatory Visit: Payer: Self-pay | Admitting: Internal Medicine

## 2014-06-18 NOTE — Telephone Encounter (Signed)
Sent to the pharmacy by e-scribe. 

## 2014-06-18 NOTE — Telephone Encounter (Signed)
Ok to refill x 3 

## 2014-07-23 ENCOUNTER — Ambulatory Visit: Payer: 59 | Admitting: Neurology

## 2014-08-26 ENCOUNTER — Ambulatory Visit (INDEPENDENT_AMBULATORY_CARE_PROVIDER_SITE_OTHER): Payer: 59 | Admitting: Otolaryngology

## 2014-08-26 DIAGNOSIS — J343 Hypertrophy of nasal turbinates: Secondary | ICD-10-CM | POA: Diagnosis not present

## 2014-08-26 DIAGNOSIS — J342 Deviated nasal septum: Secondary | ICD-10-CM | POA: Diagnosis not present

## 2014-09-09 ENCOUNTER — Encounter: Payer: Self-pay | Admitting: Neurology

## 2014-09-09 ENCOUNTER — Ambulatory Visit (INDEPENDENT_AMBULATORY_CARE_PROVIDER_SITE_OTHER): Payer: 59 | Admitting: Neurology

## 2014-09-09 VITALS — BP 120/84 | HR 76 | Ht 67.0 in | Wt 292.0 lb

## 2014-09-09 DIAGNOSIS — R51 Headache: Secondary | ICD-10-CM

## 2014-09-09 DIAGNOSIS — G4486 Cervicogenic headache: Secondary | ICD-10-CM

## 2014-09-09 MED ORDER — CYCLOBENZAPRINE HCL 5 MG PO TABS: 5.0000 mg | ORAL_TABLET | Freq: Every evening | ORAL | Status: DC | PRN

## 2014-09-09 NOTE — Patient Instructions (Addendum)
It was an absolute pleasure to see you day.  Your headaches are most consistent with cervicogenic headaches which can be due to increased muscle tension at the base of the head and neck. Let's plan to start neck physical therapy and also a flexeril 5mg  at bedtime, a muscle relaxant. I'll see you back in 2-3 months, or sooner as needed

## 2014-09-09 NOTE — Progress Notes (Signed)
[facility] Clinic Note - Initial Visit   Date: 09/09/2014  Beth Wallace MRN: [id number] DOB: 14-Nov-1965   Dear Dr. Regis Bill:  Thank you for your kind referral of Beth Wallace for consultation of head pressure. Although her history is well known to you, please allow [name] to reiterate it for the purpose of our medical record. The patient was accompanied to the clinic by self.    History of Present Illness: Beth Wallace is a 49 y.o. right-handed African American female with hypertension, seasonal allergies, and GERD presenting for evaluation of head pressure.    Starting around January 2016, she started developing pressure over the vertex of the head and ocassionally over the forehead.  Pain is improved by walking and resting.  It is worse when she is working and lasts several hours.  It can occur daily but she usually does not notice the discomfort when she is not working.  No photophobia, phonophobia, nasuea, vomiting.  Pain is ranked as 4-5/10.  It does not limit her daily functioning, but is annoying.  She has tried excedrin migraine, aleve, and tylenol none which provided significant relief.  She was given prednisone injection but this did not help either.   She went to her allergist and initially thought it was due allergies.  She then went to the emergency department and told to follow-up with ENT.  She was told she had a enlarged turbinate and deviated septum.  She works on her computer for 12-14 days and was told that headaches may simply be tension.    She previously had migraine when pregnant which would be associated with photophobia and phonophobia.    She is a delightful lady who unfortunately has lost her 78 year old daughter and younger sister in a triple homicide.  She has a tremendous strength and integrity of character and has managed to deal with such tragedy in an effective manner.   Out-side paper records, electronic medical record, and  images have been reviewed where available and summarized as:  Lab Results  Component Value Date   TSH 1.77 04/06/2014   Lab Results  Component Value Date   HGBA1C 6.1 09/08/2013   XR cervical spine 12/30/2013:  Mild spondylosis at C5-6. No acute osseous abnormality.  Past Medical History  Diagnosis Date  . HTN (hypertension)   . GERD (gastroesophageal reflux disease)   . Anxiety   . History of gallstones   . Obesity   . TMJ (temporomandibular joint disorder)   . ADJ DISORDER WITH MIXED ANXIETY & DEPRESSED MOOD 12/14/2009    Qualifier: Diagnosis of  By: Regis Bill MD, Standley Brooking   . Metrorrhagia 12/14/2009    Qualifier: Diagnosis of  By: Regis Bill MD, Standley Brooking With pelvic pain  Sees Dr Philis Pique and tobotic hysterectomy scheduled     Past Surgical History  Procedure Laterality Date  . Cholecystectomy  1989  . Cesarean section    . Wisdom tooth extraction  2010  . Abdominal hysterectomy  09/2010    partial     Medications:  Outpatient Encounter Prescriptions as of 09/09/2014  Medication Sig Note  . ibuprofen (ADVIL,MOTRIN) 800 MG tablet TAKE 1 TABLET BY MOUTH EVERY 8 HOURS AS NEEDED FOR CRAMPS   . lisinopril-hydrochlorothiazide (PRINZIDE,ZESTORETIC) 20-12.5 MG per tablet TAKE 1 TABLET BY MOUTH DAILY.   . [DISCONTINUED] amoxicillin-clavulanate (AUGMENTIN) 875-125 MG per tablet Take 1 tablet by mouth 2 (two) times daily. For 7 days   . [DISCONTINUED] Azelastine HCl 0.15 % SOLN  09/09/2014: Received  from: External Pharmacy  . [DISCONTINUED] cyclobenzaprine (FLEXERIL) 10 MG tablet Take 0.5 tablets (5 mg total) by mouth daily as needed. Muscle spasms (Patient not taking: Reported on 05/25/2014)   . [DISCONTINUED] HYDROcodone-acetaminophen (NORCO/VICODIN) 5-325 MG per tablet TAKE 1 TABLET BY MOUTH EVERY 4 TO 6 HOURS AS NEEDED FOR PAIN 09/09/2014: Received from: External Pharmacy  . [DISCONTINUED] ibuprofen (ADVIL,MOTRIN) 600 MG tablet TAKE 1 TABLET BY MOUTH EVERY 6 TO 8 HOURS AS NEEDED FOR PAIN  09/09/2014: Received from: External Pharmacy  . [DISCONTINUED] omeprazole (PRILOSEC) 20 MG capsule Take 1 capsule (20 mg total) by mouth daily. For heartburn   . [DISCONTINUED] predniSONE (DELTASONE) 10 MG tablet Take 6 tablets day one, 5 tablets day two, 4 tablets day three, 3 tablets day four, 2 tablets day five, then 1 tablet day six   . [DISCONTINUED] sulfamethoxazole-trimethoprim (BACTRIM DS,SEPTRA DS) 800-160 MG per tablet  09/09/2014: Received from: External Pharmacy   No facility-administered encounter medications on file as of 09/09/2014.     Allergies:  Allergies  Allergen Reactions  . Bee Venom Shortness Of Breath  . Contrast Media [Iodinated Diagnostic Agents] Anaphylaxis    CT dye ivp Throat swelling    Family History: Family History  Problem Relation Age of Onset  . Heart disease Mother 62    Died age 28, MI  . Heart attack Mother   . Hypertension Mother   . Hypertension Father   . Diabetes Father   . Heart disease Father   . Hyperlipidemia Neg Hx   . Sudden death Neg Hx     Social History: Social History  Substance Use Topics  . Smoking status: Former Smoker -- 0.25 packs/day    Types: Cigarettes    Quit date: 01/22/2012  . Smokeless tobacco: Never Used  . 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   EYES: No visual changes or eye pain ENT: No hearing changes.  No history of nose  bleeds.   RESPIRATORY: No cough, wheezing and shortness of breath.   CARDIOVASCULAR: Negative for chest pain, and palpitations.   GI: Negative for abdominal discomfort, blood in stools or black stools.  No recent change in bowel habits.   GU:  No history of incontinence.   MUSCLOSKELETAL: No history of joint pain or swelling.  No myalgias.   SKIN: Negative for lesions, rash, and itching.   HEMATOLOGY/ONCOLOGY: Negative for prolonged bleeding, bruising easily, and swollen nodes.   ENDOCRINE: Negative for cold or heat intolerance, polydipsia or goiter.   PSYCH:  No depression or  anxiety symptoms.   NEURO: As Above.   Vital Signs:  BP 120/84 mmHg  Pulse 76  Ht 5\' 7"  (1.702 m)  Wt 292 lb (132.45 kg)  BMI 45.72 kg/m2  SpO2 98%   General Medical Exam:   General:  Well appearing, comfortable.   Eyes/ENT: see cranial nerve examination.   Neck: No masses appreciated.  Reduced neck range of motion especially with rotation and side bending.  No carotid bruits.  Increased muscle tension over the cervical paraspinal muscles. Respiratory:  Clear to auscultation, good air entry bilaterally.   Cardiac:  Regular rate and rhythm, no murmur.   Extremities:  No deformities, edema, or skin discoloration.  Skin:  No rashes or lesions.  Neurological Exam: MENTAL STATUS including orientation to time, place, person, recent and remote memory, attention span and concentration, language, and fund of knowledge is normal.  Speech is not dysarthric.  CRANIAL NERVES: II:  No visual field defects.  Unremarkable fundi.   III-IV-VI: Pupils equal round and reactive to light.  Normal conjugate, extra-ocular eye movements in all directions of gaze.  No nystagmus.  No ptosis.   V:  Normal facial sensation.   VII:  Normal facial symmetry and movements.  VIII:  Normal hearing and vestibular function.   IX-X:  Normal palatal movement.   XI:  Normal shoulder shrug and head rotation.   XII:  Normal tongue strength and  range of motion, no deviation or fasciculation.  MOTOR:  No atrophy, fasciculations or abnormal movements.  No pronator drift.  Tone is normal.    Right Upper Extremity:    Left Upper Extremity:    Deltoid  5/5   Deltoid  5/5   Biceps  5/5   Biceps  5/5   Triceps  5/5   Triceps  5/5   Wrist extensors  5/5   Wrist extensors  5/5   Wrist flexors  5/5   Wrist flexors  5/5   Finger extensors  5/5   Finger extensors  5/5   Finger flexors  5/5   Finger flexors  5/5   Dorsal interossei  5/5   Dorsal interossei  5/5   Abductor pollicis  5/5   Abductor pollicis  5/5   Tone (Ashworth scale)  0  Tone (Ashworth scale)  0   Right Lower Extremity:    Left Lower Extremity:    Hip flexors  5/5   Hip flexors  5/5   Hip extensors  5/5   Hip extensors  5/5   Knee flexors  5/5   Knee flexors  5/5   Knee extensors  5/5   Knee extensors  5/5   Dorsiflexors  5/5   Dorsiflexors  5/5   Plantarflexors  5/5   Plantarflexors  5/5   Toe extensors  5/5   Toe extensors  5/5   Toe flexors  5/5   Toe flexors  5/5   Tone (Ashworth scale)  0  Tone (Ashworth scale)  0   MSRs:  Right                                                                 Left brachioradialis 2+  brachioradialis 2+  biceps 2+  biceps 2+  triceps 2+  triceps 2+  patellar 2+  patellar 2+  ankle jerk 2+  ankle jerk 2+  Hoffman no  Hoffman no  plantar response down  plantar response down   SENSORY:  Normal and symmetric perception of light touch, pinprick, vibration, and proprioception.  Romberg's sign absent.   COORDINATION/GAIT: Normal finger-to- nose-finger and heel-to-shin.  Intact rapid alternating movements bilaterally.  Able to rise from a chair without using arms.  Gait narrow based and stable. Tandem and stressed gait intact.    IMPRESSION: Beth Wallace is a most delightful 49 year-old female presenting for evaluation of head pressure.  Her exam is entirely normal and non-focal, except that there is increased muscle tension and  tenderness over the cervical region.   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  I recommend that she start neck physiotherapy and flexeril 5mg  at bedtime. If there is any worsening of headaches or she develops new neurological symptoms, proceed with MRI brain.  Return to clinic in 3 months   The duration of this appointment visit was 40 minutes of face-to-face time with the patient.  Greater than 50% of this time was spent in counseling, explanation of diagnosis, planning of further management, and coordination of care.   Thank you for allowing me to participate in patient's care.  If I can answer any additional questions, I would be pleased to do so.    Sincerely,    Donika K. Posey Pronto, DO

## 2014-09-23 ENCOUNTER — Ambulatory Visit (HOSPITAL_COMMUNITY): Payer: 59 | Attending: Neurology | Admitting: Physical Therapy

## 2014-09-23 DIAGNOSIS — M5382 Other specified dorsopathies, cervical region: Secondary | ICD-10-CM | POA: Insufficient documentation

## 2014-09-23 DIAGNOSIS — M542 Cervicalgia: Secondary | ICD-10-CM | POA: Diagnosis present

## 2014-09-23 DIAGNOSIS — M436 Torticollis: Secondary | ICD-10-CM | POA: Insufficient documentation

## 2014-09-23 DIAGNOSIS — R293 Abnormal posture: Secondary | ICD-10-CM | POA: Diagnosis present

## 2014-09-23 DIAGNOSIS — Z8739 Personal history of other diseases of the musculoskeletal system and connective tissue: Secondary | ICD-10-CM

## 2014-09-23 DIAGNOSIS — G4486 Cervicogenic headache: Secondary | ICD-10-CM

## 2014-09-23 DIAGNOSIS — R51 Headache: Secondary | ICD-10-CM | POA: Insufficient documentation

## 2014-09-23 NOTE — Patient Instructions (Signed)
AROM: Neck Flexion   Bend head forward. Hold __2__ seconds. Repeat __15__ times per set. Do __1__ sets per session. Do _2-3___ sessions per day.  http://orth.exer.us/299   Copyright  VHI. All rights reserved.   AROM: Lateral Neck Flexion   Slowly tilt head toward one shoulder, then the other. Hold each position __2__ seconds. Repeat _15___ times per set. Do _1___ sets per session. Do _2-3___ sessions per day.  http://orth.exer.us/297   Copyright  VHI. All rights reserved.   AROM: Neck Rotation   Turn head slowly to look over one shoulder, then the other. Hold each position __2__ seconds. Repeat __15__ times per set. Do _1___ sets per session. Do _2-3___ sessions per day.  http://orth.exer.us/295   Copyright  VHI. All rights reserved.      Upper Trapezius Stretch  Sit tall.  Hold on with one hand under your chair.  Lean the opposite direction until your shoulder is anchored.  Gently move the opposite ear toward the opposite shoulder until a comfortable stretch is felt in the upper shoulder area.  The head can be moved forward from this position.  The stretch should be held in whatever position provides the best stretch. This can also be done standing and holding on to any stable object.  Hold for 30 seconds, 2-3 times each side. Repeat 2-3 sessions per day.     CORNER PEC STRETCH  Place arms up against a corner as shown in the picture. Gently lean forward toward the wall until you feel a slight stretch in the front part of your chest. This exercise should not be painful.   Hold 30 seconds and repeat 2-3 times, two-three sessions per day.   Lateral Glide and retrusion of TMJ   Move jaw to right side. Hold _0___ seconds. Relax. Repeat _10___ times per set. Do __1__ sets per session. Do __2-3__ sessions per day.  You can also stick your bottom jaw forward, and also pull your bottom jaw back; if it is painful, just go through less of a range and move more  slowly.  Repeat 10 times per set, and repeat 2-3 times per day.   Copyright  VHI. All rights reserved.

## 2014-09-23 NOTE — Therapy (Signed)
Beth Wallace, [place], [address] Phone: [phone]   Fax:  [phone]  Physical Therapy Evaluation  Patient Details  Name: Beth Wallace MRN: [id number] Date of Birth: 1965-09-15 Referring Provider:  Alda Berthold, DO  Encounter Date: 09/23/2014      PT End of Session - 09/23/14 1737    Visit Number 1   Number of Visits 16   Date for PT Re-Evaluation 10/21/14   Authorization Type UHC/BCBS   Authorization Time Period 09/23/14 to 11/23/14   PT Start Time 1647   PT Stop Time 1731   PT Time Calculation (min) 44 min   Activity Tolerance Patient tolerated treatment well   Behavior During Therapy [facility] for tasks assessed/performed      Past Medical History  Diagnosis Date  . HTN (hypertension)   . GERD (gastroesophageal reflux disease)   . Anxiety   . History of gallstones   . Obesity   . TMJ (temporomandibular joint disorder)   . ADJ DISORDER WITH MIXED ANXIETY & DEPRESSED MOOD 12/14/2009    Qualifier: Diagnosis of  By: Regis Bill MD, Standley Brooking   . Metrorrhagia 12/14/2009    Qualifier: Diagnosis of  By: Regis Bill MD, Standley Brooking With pelvic pain  Sees Dr Philis Pique and tobotic hysterectomy scheduled     Past Surgical History  Procedure Laterality Date  . Cholecystectomy  1989  . Cesarean section    . Wisdom tooth extraction  2010  . Abdominal hysterectomy  09/2010    partial    There were no vitals filed for this visit.  Visit Diagnosis:  Cervicogenic headache - Plan: PT plan of care cert/re-cert  Neck stiffness - Plan: PT plan of care cert/re-cert  History of TMJ disorder - Plan: PT plan of care cert/re-cert  Neck pain - Plan: PT plan of care cert/re-cert  Poor posture - Plan: PT plan of care cert/re-cert  Neck muscle weakness - Plan: PT plan of care cert/re-cert      Subjective Assessment - 09/23/14 1650    Subjective Patient reports she just does not feel herself, she just does not feel "..like me". She  has put a heating pad on back of work chair that does help. She is originally from [place] and suffers from major allergies. Discomfort gets worst when she is sitting.    Pertinent History Patient reports that she is not sure if they are true headaches- they are more like pressure at the top of her head, sometimes in the back as well. MD thought it was sinuses at first; finally saw neurologist who said it was cervicogenic headaches.  Patient does have servere allergies, also has TMJ. 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    How long can you sit comfortably? Immediate discomfort    Patient Stated Goals pain relief, get back to normal    Currently in Pain? Yes   Pain Score 1    Pain Location Head   Pain Orientation --  mainly top of skull             [facility] PT Assessment - 09/23/14 0001    Assessment   Medical Diagnosis cervicogenic headache    Onset Date/Surgical Date 01/22/14  approximate    Next MD Visit November with Dr. Posey Pronto    Precautions   Precautions None   Restrictions   Weight Bearing Restrictions No   Balance Screen   Has  the patient fallen in the past 6 months No   Has the patient had a decrease in activity level because of a fear of falling?  Yes   Is the patient reluctant to leave their home because of a fear of falling?  Yes   Prior Function   Level of Independence Independent;Independent with basic ADLs;Independent with gait;Independent with transfers   Vocation Full time employment   Vocation Requirements lots of sitting- works from home and must work 12-14 hours per day    Leisure reading, DJ, riding bike, water aerobics    Observation/Other Assessments   Observations limited TMJ retrustion, L lateral protrustion; audible pops. R side of TMJ is much more painful than L.  Tenderness to [place] and Buccinator bilaterally.    Focus on Therapeutic Outcomes (FOTO)  11%   Posture/Postural Control   Posture Comments forward  head with B IR shoulders, some flatness to Thoracic spine    AROM   Right Shoulder Flexion --  WFL    Right Shoulder ABduction --  [facility]    Right Shoulder Internal Rotation --  [facility]    Right Shoulder External Rotation --  [facility]    Left Shoulder Flexion --  [facility]    Left Shoulder ABduction --  [facility]    Left Shoulder Internal Rotation --  [facility]    Left Shoulder External Rotation --  [facility]    Cervical Flexion 63   Cervical Extension 45   Cervical - Right Side Bend 39   Cervical - Left Side Bend 37   Cervical - Right Rotation 64   Cervical - Left Rotation 62   Strength   Right Shoulder Flexion 4+/5   Right Shoulder ABduction 4/5   Right Shoulder Internal Rotation 4+/5   Right Shoulder External Rotation 4-/5   Left Shoulder Flexion 4/5   Left Shoulder ABduction 4-/5   Left Shoulder Internal Rotation 4+/5   Left Shoulder External Rotation 4/5   Cervical Flexion 3-/5   Cervical Extension 4-/5   Cervical - Right Side Bend 4+/5   Cervical - Left Side Bend 4+/5   Cervical - Right Rotation 4/5   Cervical - Left Rotation 4/5   Palpation   Palpation comment significant tightness and muscle knotting noted in upper traps and cervical extensors                           PT Education - 09/23/14 1734    Education provided Yes   Education Details prognosis, plan of care moving forward, HEP; difference between muscles and nerves; possible pathoanatomical cause of headaches    Person(s) Educated Patient   Methods Explanation;Demonstration;Handout   Comprehension Verbalized understanding;Returned demonstration          PT Short Term Goals - 09/23/14 1743    PT SHORT TERM GOAL #1   Title Patient will demonstrate an improvement in cervical range by at least 10 degrees, pain-free    Time 4   Period Weeks   Status New   PT SHORT TERM GOAL #2   Title Patient will demonstrate moderate improvement in TMJ lateral protrusion and retraction, pain-free    Time 4   Period Weeks    Status New   PT SHORT TERM GOAL #3   Title Patient will verbalize the importance of improved posture and will demonstrate improved posture without cues at least 80% of the time    Time 4   Period Weeks   Status  New   PT SHORT TERM GOAL #4   Title Patient will be independent in correctly and consistently performing appropriate HEP, to be updated PRN    Time 4   Period Weeks   Status New   PT SHORT TERM GOAL #5   Title Patient will report that she has been regularly getting up from her desk for at least 10-15 minutes every 60-90 minutes to promote improved posture and improved general health habits    Time 4   Period Weeks   Status New           PT Long Term Goals - 09/23/14 1749    PT LONG TERM GOAL #1   Title Patient to demonstrate only minimal muscle tension in bilateral upper traps and cervical extensors    Time 8   Period Weeks   Status New   PT LONG TERM GOAL #2   Title Patient to demonstrate cervical strength 5/5 in all gravity present planes with pain 0/10   Time 8   Period Weeks   Status New   PT LONG TERM GOAL #3   Title Patient to demonstrate cervical and TMJ mobility WFL on all planes and in all motions, pain 0/10   Time 8   Period Weeks   Status New   PT LONG TERM GOAL #4   Title Patient to be independent in maintaining proper posture 100% of the time in all functional work based scenarios with head pressure no more than 30% of what it was at initial evaluation    Time 8   Period Weeks   Status New   PT LONG TERM GOAL #5   Title Patient to report that she has been performing regular walking program, at least 4 days per week, of moderate activities for at least 30 minutes in order to promote improved health habits    Time [place] - 09/23/14 1739    Clinical Impression Statement Patient presents with head pressure, significant muscle tightness, cervical weakness, cervical and TMJ stiffness, poor posture,  cervical pain and headaches, and reduced functional task performance tolerance at this time. Patient reports that her symtpoms started at the beginning of the year and appear to have worsened as she has worked from home and really does not move around much trhoughout the day; a typical work day for her can be as long as 14-18 hours at times. She reports that she has had TMJ for awhile but has not received therapy for it. At this time patient will benefit from skilled PT services in order to address her functional impairments and assist her in reaching an optimal level of function.    Pt will benefit from skilled therapeutic intervention in order to improve on the following deficits Hypomobility;Decreased strength;Pain;Increased muscle spasms;Increased fascial restricitons;Improper body mechanics;Impaired flexibility;Postural dysfunction   Rehab Potential Good   PT Frequency 2x / week   PT Duration 8 weeks   PT Treatment/Interventions ADLs/Self Care Home Management;Moist Heat;Therapeutic activities;Therapeutic exercise;Patient/family education;Manual techniques;Other (comment)  postural training    PT Next Visit Plan review HEP and goals; focus on functional stretches, mobility exercises, manual, TMJ exercises    PT Home Exercise Plan given    Consulted and Agree with Plan of Care Patient         Problem List Patient Active Problem List   Diagnosis Date Noted  .  Discomfort in chest 09/08/2013  . Hyperglycemia 09/08/2013  . Low HDL (under 40) 04/08/2013  . Visit for preventive health examination 04/08/2013  . Severe obesity (BMI >= 40) 04/08/2013  . Achilles tendinitis 10/08/2012  . Back pain 07/08/2012  . Sciatica neuralgia 07/08/2012  . Stress 01/23/2012  . Rapid palpitations 01/23/2012  . H/O bee sting allergy 05/18/2011  . Allergic reaction 05/18/2011  . Itching 05/18/2011  . Fatigue 04/25/2011  . GERD (gastroesophageal reflux disease) 04/12/2011  . Dysphagia, unspecified(787.20)  04/12/2011  . Upper abdominal pain 04/12/2011  . Abnormal urine 03/13/2011  . Chest discomfort 03/13/2011  . LOW HDL 01/31/2010  . OBESITY 01/31/2010  . HYPERTENSION 12/14/2009  . NECK AND BACK PAIN 12/14/2009  . BACK PAIN WITH RADICULOPATHY 12/14/2009  . HEARTBURN 12/14/2009    Deniece Ree PT, DPT [phone]  [place] [address] [place], [place], [address] Phone: [phone]   Fax:  [phone]

## 2014-09-29 ENCOUNTER — Other Ambulatory Visit: Payer: Self-pay | Admitting: Internal Medicine

## 2014-09-29 ENCOUNTER — Ambulatory Visit (HOSPITAL_COMMUNITY): Payer: 59 | Admitting: Physical Therapy

## 2014-09-30 NOTE — Telephone Encounter (Signed)
Call in #[phone] with no rf 

## 2014-10-01 ENCOUNTER — Ambulatory Visit (HOSPITAL_COMMUNITY): Payer: 59 | Admitting: Physical Therapy

## 2014-10-04 ENCOUNTER — Ambulatory Visit (HOSPITAL_COMMUNITY): Payer: 59 | Admitting: Physical Therapy

## 2014-10-04 NOTE — Telephone Encounter (Signed)
This med is not on her active med list  Last filled 2014 jan for temproary  Anxiety   Lorazepam was also  on her old med list . Please advise updated hx and request for refill  .  If needed can refill #24  No refill  And then would need ov to discuss . Need for Further refills

## 2014-10-04 NOTE — Telephone Encounter (Signed)
Will check with WP to see if she wants to fil longer

## 2014-10-05 ENCOUNTER — Other Ambulatory Visit: Payer: Self-pay | Admitting: Family Medicine

## 2014-10-05 DIAGNOSIS — E786 Lipoprotein deficiency: Secondary | ICD-10-CM

## 2014-10-05 DIAGNOSIS — Z0184 Encounter for antibody response examination: Secondary | ICD-10-CM

## 2014-10-05 DIAGNOSIS — R5383 Other fatigue: Secondary | ICD-10-CM

## 2014-10-05 DIAGNOSIS — R739 Hyperglycemia, unspecified: Secondary | ICD-10-CM

## 2014-10-05 NOTE — Telephone Encounter (Signed)
Left a message on home and cell for a return call. 

## 2014-10-05 NOTE — Telephone Encounter (Signed)
Called to the pharmacy and left on machine.  Pt has lab work on 11/12/14 and a follow up scheduled for 11/19/14.  Advised that she keep those appointments.

## 2014-10-07 ENCOUNTER — Other Ambulatory Visit: Payer: 59

## 2014-10-08 ENCOUNTER — Ambulatory Visit (HOSPITAL_COMMUNITY): Payer: 59 | Admitting: Physical Therapy

## 2014-10-11 ENCOUNTER — Encounter: Payer: Self-pay | Admitting: Neurology

## 2014-10-11 ENCOUNTER — Telehealth (HOSPITAL_COMMUNITY): Payer: Self-pay | Admitting: Physical Therapy

## 2014-10-11 ENCOUNTER — Other Ambulatory Visit: Payer: Self-pay | Admitting: *Deleted

## 2014-10-11 ENCOUNTER — Ambulatory Visit (HOSPITAL_COMMUNITY): Payer: 59 | Admitting: Physical Therapy

## 2014-10-11 DIAGNOSIS — R51 Headache: Principal | ICD-10-CM

## 2014-10-11 DIAGNOSIS — G4486 Cervicogenic headache: Secondary | ICD-10-CM

## 2014-10-14 ENCOUNTER — Other Ambulatory Visit: Payer: Self-pay | Admitting: Obstetrics and Gynecology

## 2014-10-14 ENCOUNTER — Ambulatory Visit (HOSPITAL_COMMUNITY): Payer: 59

## 2014-10-14 ENCOUNTER — Ambulatory Visit: Payer: 59 | Admitting: Internal Medicine

## 2014-10-18 ENCOUNTER — Ambulatory Visit (HOSPITAL_COMMUNITY): Payer: 59 | Admitting: Physical Therapy

## 2014-10-21 ENCOUNTER — Ambulatory Visit (HOSPITAL_COMMUNITY): Payer: 59

## 2014-10-25 ENCOUNTER — Ambulatory Visit (HOSPITAL_COMMUNITY): Payer: 59 | Admitting: Physical Therapy

## 2014-10-28 ENCOUNTER — Ambulatory Visit (HOSPITAL_COMMUNITY): Payer: 59

## 2014-11-01 ENCOUNTER — Ambulatory Visit (HOSPITAL_COMMUNITY): Payer: 59 | Admitting: Physical Therapy

## 2014-11-01 NOTE — Therapy (Signed)
[facility] [address] [place], [place], [address] Phone: [phone]   Fax:  [phone]  Patient Details  Name: Beth Wallace MRN: [id number] Date of Birth: 10/10/1965 Referring Provider:  Patel, Donika K, DO  Encounter Date:11/01/2014  PHYSICAL THERAPY DISCHARGE SUMMARY  Visits from Start of Care: 1  Current functional level related to goals / functional outcomes: Patient cancelled all visits as she wanted to be seen at clinic closer to her home in [place].    Remaining deficits: Unable to assess    Education / Equipment: N/A  Plan: Patient agrees to discharge.  Patient goals were not met. Patient is being discharged due to the patient's request.  ?????       Kristen Unger PT, DPT [phone]  [facility] [address] [place], [place], [address] Phone: [phone]   Fax:  [phone] 

## 2014-11-04 ENCOUNTER — Ambulatory Visit (HOSPITAL_COMMUNITY): Payer: 59

## 2014-11-05 ENCOUNTER — Telehealth: Payer: Self-pay | Admitting: Internal Medicine

## 2014-11-05 NOTE — Telephone Encounter (Signed)
Pt has been sch for sat clinic

## 2014-11-05 NOTE — Telephone Encounter (Signed)
Pt needs ov for antibiotic.  See if she can be seen in the Saturday clinic.

## 2014-11-05 NOTE — Telephone Encounter (Signed)
Pt has a bladder infection unable to come in today due to lives in [place]. Pt would like abx call [facility] in [place].

## 2014-11-06 ENCOUNTER — Ambulatory Visit (INDEPENDENT_AMBULATORY_CARE_PROVIDER_SITE_OTHER): Payer: 59 | Admitting: Family Medicine

## 2014-11-06 ENCOUNTER — Encounter: Payer: Self-pay | Admitting: Family Medicine

## 2014-11-06 VITALS — BP 142/96 | HR 78 | Temp 98.1°F | Resp 20 | Ht 67.0 in | Wt 296.2 lb

## 2014-11-06 DIAGNOSIS — N390 Urinary tract infection, site not specified: Secondary | ICD-10-CM | POA: Insufficient documentation

## 2014-11-06 DIAGNOSIS — R300 Dysuria: Secondary | ICD-10-CM | POA: Diagnosis not present

## 2014-11-06 DIAGNOSIS — N3001 Acute cystitis with hematuria: Secondary | ICD-10-CM | POA: Diagnosis not present

## 2014-11-06 LAB — POCT URINALYSIS DIPSTICK
Bilirubin, UA: NEGATIVE
GLUCOSE UA: NEGATIVE
KETONES UA: NEGATIVE
Leukocytes, UA: NEGATIVE
Nitrite, UA: NEGATIVE
Protein, UA: 15
RBC UA: 2
UROBILINOGEN UA: 1
pH, UA: 6

## 2014-11-06 MED ORDER — CEPHALEXIN 500 MG PO CAPS: 500.0000 mg | ORAL_CAPSULE | Freq: Two times a day (BID) | ORAL | Status: DC

## 2014-11-06 MED ORDER — FLUCONAZOLE 150 MG PO TABS: 150.0000 mg | ORAL_TABLET | Freq: Once | ORAL | Status: DC

## 2014-11-06 NOTE — Assessment & Plan Note (Signed)
Patient characteristic symptoms of UTI and blood on urinalysis today. Sending urine for culture. Treating empirically with Keflex. Rx for Diflucan given to patient as she is prone to yeast infections with antibiotic therapy.

## 2014-11-06 NOTE — Progress Notes (Signed)
Pre visit review using our clinic review tool, if applicable. No additional management support is needed unless otherwise documented below in the visit note. 

## 2014-11-06 NOTE — Patient Instructions (Signed)
Take the antibiotic as prescribed.  If you have any other concerns or you worsen please call.  Take care  Dr. Lacinda Axon

## 2014-11-06 NOTE — Progress Notes (Signed)
Subjective:  Patient ID: Beth Wallace, female    DOB: [birth year]  Age: 49 y.o. MRN: [id number]  CC: Pain with urination  HPI:  49 year old female presents to clinic today with complaints of pain with urination.  Patient reports a one-week history of dysuria and urinary frequency. She reports that she has some left flank pain as well but this is been present for quite some time (she has had negative workup regarding this; imaging, referral to urology etc.).  She denies any associated symptoms: Fever, chills, nausea, vomiting.   Social Hx   Social History   Social History  . Marital Status: Single    Spouse Name: N/A  . Number of Children: 1  . Years of Education: N/A   Occupational History  . manager    Social History Main Topics  . Smoking status: Former Smoker -- 0.25 packs/day    Types: Cigarettes    Quit date: 01/22/2012  . Smokeless tobacco: Never Used     Comment: 1-2 cigarretes daily  . Alcohol Use: No  . Drug Use: No  . Sexual Activity: Not Asked   Other Topics Concern  . 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  Gastrointestinal: Negative for nausea and vomiting.  Genitourinary: Positive for dysuria and frequency.   Objective:  BP 142/96 mmHg  Pulse 78  Temp(Src) 98.1 F (36.7 C) (Oral)  Resp 20  Ht 5\' 7"  (1.702 m)  Wt 296 lb 4 oz (134.378 kg)  BMI 46.39 kg/m2  SpO2 97%  BP/Weight 11/06/2014 09/09/2014 10/07/3005    Systolic BP 622 633 354  Diastolic BP 96 84 75  Wt. (Lbs) 296.25 292 280  BMI 46.39 45.72 43.18    Physical Exam  Constitutional: She appears well-developed and well-nourished. No distress.  Cardiovascular: Normal rate and regular rhythm.   No murmur heard. Pulmonary/Chest: Effort normal and breath sounds normal.  Abdominal: Soft.  Mild suprapubic tenderness. No guarding. No CVA tenderness.  Neurological: She is alert.  Psychiatric: She has a normal mood and affect.  Vitals reviewed.  Results for orders placed or performed in visit on 11/06/14 (from the past 24 hour(s))  POCT urinalysis dipstick     Status: None   Collection Time: 11/06/14 10:08 AM  Result Value Ref Range   Color, UA orange    Clarity, UA clear    Glucose, UA negative    Bilirubin, UA negative    Ketones, UA negative    Spec Grav, UA >=1.030    Blood, UA 2    pH, UA 6.0    Protein, UA 15  Urobilinogen, UA 1.0    Nitrite, UA negative    Leukocytes, UA Negative Negative   Assessment & Plan:   Problem List Items Addressed This Visit    UTI (urinary tract infection) - Primary    Patient characteristic symptoms of UTI and blood on urinalysis today. Sending urine for culture. Treating empirically with Keflex. Rx for Diflucan given to patient as she is prone to yeast infections with antibiotic therapy.      Relevant Medications   cephALEXin (KEFLEX) 500 MG capsule   fluconazole (DIFLUCAN) 150 MG tablet    Other Visit Diagnoses    Dysuria        Relevant Orders    POCT urinalysis dipstick (Completed)    Urine culture       Meds ordered this encounter  Medications  . cephALEXin (KEFLEX) 500 MG capsule    Sig: Take 1 capsule (500 mg total) by mouth 2 (two) times daily.    Dispense:  14 capsule    Refill:  0  . fluconazole (DIFLUCAN) 150 MG tablet    Sig: Take 1 tablet (150 mg total) by mouth once. Repeat dose in 72 hours.    Dispense:  2 tablet    Refill:  1    Follow-up: PRN  Thersa Salt, DO

## 2014-11-08 ENCOUNTER — Ambulatory Visit (HOSPITAL_COMMUNITY): Payer: 59 | Admitting: Physical Therapy

## 2014-11-09 ENCOUNTER — Encounter: Payer: Self-pay | Admitting: *Deleted

## 2014-11-11 ENCOUNTER — Ambulatory Visit (HOSPITAL_COMMUNITY): Payer: 59

## 2014-11-12 ENCOUNTER — Other Ambulatory Visit: Payer: 59

## 2014-11-15 ENCOUNTER — Ambulatory Visit (HOSPITAL_COMMUNITY): Payer: 59 | Admitting: Physical Therapy

## 2014-11-18 ENCOUNTER — Ambulatory Visit (HOSPITAL_COMMUNITY): Payer: 59 | Admitting: Physical Therapy

## 2014-11-19 ENCOUNTER — Ambulatory Visit (INDEPENDENT_AMBULATORY_CARE_PROVIDER_SITE_OTHER): Payer: 59 | Admitting: Internal Medicine

## 2014-11-19 ENCOUNTER — Encounter: Payer: Self-pay | Admitting: Internal Medicine

## 2014-11-19 VITALS — BP 132/90 | Temp 98.1°F | Wt 294.4 lb

## 2014-11-19 DIAGNOSIS — R5383 Other fatigue: Secondary | ICD-10-CM

## 2014-11-19 DIAGNOSIS — I10 Essential (primary) hypertension: Secondary | ICD-10-CM | POA: Diagnosis not present

## 2014-11-19 DIAGNOSIS — R739 Hyperglycemia, unspecified: Secondary | ICD-10-CM

## 2014-11-19 DIAGNOSIS — Z23 Encounter for immunization: Secondary | ICD-10-CM

## 2014-11-19 DIAGNOSIS — R3915 Urgency of urination: Secondary | ICD-10-CM

## 2014-11-19 DIAGNOSIS — E559 Vitamin D deficiency, unspecified: Secondary | ICD-10-CM

## 2014-11-19 DIAGNOSIS — E786 Lipoprotein deficiency: Secondary | ICD-10-CM | POA: Diagnosis not present

## 2014-11-19 DIAGNOSIS — Z0184 Encounter for antibody response examination: Secondary | ICD-10-CM

## 2014-11-19 LAB — POCT URINALYSIS DIP (MANUAL ENTRY)
Bilirubin, UA: NEGATIVE
GLUCOSE UA: NEGATIVE
Ketones, POC UA: NEGATIVE
Leukocytes, UA: NEGATIVE
Nitrite, UA: NEGATIVE
PROTEIN UA: NEGATIVE
Spec Grav, UA: 1.025
UROBILINOGEN UA: 0.2
pH, UA: 5.5

## 2014-11-19 LAB — LIPID PANEL
Cholesterol: 171 mg/dL (ref 0–200)
HDL: 35.4 mg/dL — ABNORMAL LOW (ref >39.00)
LDL CALC: 115 mg/dL — AB (ref 0–99)
NonHDL: 136.04
Total CHOL/HDL Ratio: 5
Triglycerides: 103 mg/dL (ref 0.0–149.0)
VLDL: 20.6 mg/dL (ref 0.0–40.0)

## 2014-11-19 LAB — MICROALBUMIN / CREATININE URINE RATIO
CREATININE, U: 111.8 mg/dL
MICROALB/CREAT RATIO: 0.6 mg/g (ref 0.0–30.0)

## 2014-11-19 LAB — HEMOGLOBIN A1C: Hgb A1c MFr Bld: 6.1 % (ref 4.6–6.5)

## 2014-11-19 LAB — VITAMIN D 25 HYDROXY (VIT D DEFICIENCY, FRACTURES): VITD: 12.52 ng/mL — ABNORMAL LOW (ref 30.00–100.00)

## 2014-11-19 NOTE — Progress Notes (Signed)
Pre visit review using our clinic review tool, if applicable. No additional management support is needed unless otherwise documented below in the visit note.  Chief Complaint  Patient presents with  . Follow-up    labs  recent possible uti and side pain   . Hypertension    HPI: Beth Wallace 49 y.o.  comes in for chronic disease/ medication management  Labs not done ahead of time  As planned  So will get today  HT: ok taking meds  BG  : abpout the same habits  Still not moving much  GERD ok   Stable  Pms  3-6  hours sleep  Work schedule Seen sat clininc  prob uti blood in urine and left side pain dysuria and frequency    Given keflex . Urgency   continues although no more pain left fl/side e pain and rad to leg sometinmes  See past hc of eval urology gi and cards   Hx flankpain abd sx . Only takes  xanax for flying  Not taking now.  ROS: See pertinent positives and negatives per HPI. No fever chills gross hematuria   Past Medical History  Diagnosis Date  . HTN (hypertension)   . GERD (gastroesophageal reflux disease)   . Anxiety   . History of gallstones   . Obesity   . TMJ (temporomandibular joint disorder)   . ADJ DISORDER WITH MIXED ANXIETY & DEPRESSED MOOD 12/14/2009    Qualifier: Diagnosis of  By: Regis Bill MD, Standley Brooking   . Metrorrhagia 12/14/2009    Qualifier: Diagnosis of  By: Regis Bill MD, Standley Brooking With pelvic pain  Sees Dr Philis Pique and tobotic hysterectomy scheduled     Family History  Problem Relation Age of Onset  . Heart disease Mother 71    Died age 82, MI  . Heart attack Mother   . Hypertension Mother   . Hypertension Father     Living  . Diabetes Father   . Heart disease Father   . Hyperlipidemia Neg Hx   . Sudden death Neg Hx   . Other Sister     Killed, age 98  . Other Daughter     Killed, age 47    Social History   Social History  . Marital Status: Single    Spouse Name: N/A  . Number of Children: 1  . Years of Education: N/A   Occupational  History  . manager    Social History Main Topics  . Smoking status: Former Smoker -- 0.25 packs/day    Types: Cigarettes    Quit date: 01/22/2012  . Smokeless tobacco: Never Used     Comment: 1-2 cigarretes daily  . Alcohol Use: No  . Drug Use: No  . Sexual Activity: Not Asked   Other Topics Concern  . 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 cyclobenzaprine (FLEXERIL) 5 MG tablet Take 1 tablet (5 mg total) by mouth at bedtime as needed for muscle spasms. 30 tablet 5  . ibuprofen (ADVIL,MOTRIN) 800 MG tablet TAKE 1 TABLET BY MOUTH EVERY 8 HOURS AS NEEDED FOR CRAMPS 30 tablet 2  . lisinopril-hydrochlorothiazide (PRINZIDE,ZESTORETIC) 20-12.5 MG per tablet TAKE 1 TABLET BY MOUTH DAILY. 90 tablet 2  . omeprazole (PRILOSEC) 10 MG capsule Take 10 mg by mouth daily.    Marland Kitchen ALPRAZolam (XANAX) 0.25 MG tablet TAKE 1 TABLET BY MOUTH 3 TIMES A DAY AS NEEDED FOR ANXIETY (Patient not taking: Reported on 11/19/2014) 24 tablet 0  . fluconazole (DIFLUCAN) 150 MG tablet Take 1 tablet (150 mg total) by mouth once. Repeat dose in 72 hours. (Patient not taking: Reported on 11/19/2014) 2 tablet 1  . cephALEXin (KEFLEX) 500 MG capsule Take 1 capsule (500 mg total) by mouth 2 (two) times daily. 14 capsule 0   No facility-administered medications prior to visit.     EXAM:  BP 132/90 mmHg  Temp(Src) 98.1 F (36.7 C) (Oral)  Wt 294 lb 6.4 oz (133.539 kg)  Body mass  index is 46.1 kg/(m^2).  GENERAL: vitals reviewed and listed above, alert, oriented, appears well hydrated and in no acute distress  Sleepy nl affect  HEENT: atraumatic, conjunctiva  clear, no obvious abnormalities on inspection of external nose and ears  NECK: no obvious masses on inspection palpation  Abdomen:  Sof,t normal bowel sounds without hepatosplenomegaly, no guarding rebound or masses no CVA tenderness ? Left flank pain  Non tender  CV: HRRR, no clubbing cyanosis or  peripheral edema nl cap refill  MS: moves all extremities without noticeable focal  abnormality PSYCH: pleasant and cooperative, no obvious depression or anxiety  ASSESSMENT AND PLAN:  Discussed the following assessment and plan:  Urinary urgency - Plan: POCT urinalysis dipstick, Culture, Urine, Microalbumin / creatinine urine ratio  Essential hypertension  Need for prophylactic vaccination and inoculation against influenza - Plan: Flu Vaccine QUAD 36+ mos PF IM (Fluarix & Fluzone Quad PF)  Vitamin D deficiency  Immunity status testing - Plan: Varicella zoster antibody, IgG  Low HDL (under 40) - Plan: Lipid panel  Hyperglycemia - Plan: Hemoglobin A1c  Other fatigue - Plan: Vit D  25 hydroxy (rtn osteoporosis monitoring) eval  for partly rx uti based on hx no ucx in system  But ordered  Also labs and keep yearly appt   In march -Patient advised to return or notify health care team  if symptoms worsen ,persist or new concerns arise.  Patient Instructions  Will notify you  of labs when available. And urine check for ongoing infection.  Will notify you  of labs when available.  Will dedicine on med depending on this .   FU  depending on labs .  Or   cpx in march  With labs and hg a1c    Wanda K. Panosh M.D.

## 2014-11-19 NOTE — Patient Instructions (Signed)
Will notify you  of labs when available. And urine check for ongoing infection.  Will notify you  of labs when available.  Will dedicine on med depending on this .   FU  depending on labs .  Or   cpx in march  With labs and hg a1c

## 2014-11-20 LAB — URINE CULTURE: Colony Count: 30000

## 2014-11-21 NOTE — Progress Notes (Signed)
Quick Note:  Tell pt urine culture showed multiple bacteria but not predominant germ. Cannot tell if she has UTI. If still ongoing we can rx with cipro 500 1 po bid for 5 days If not allergic ______

## 2014-11-22 LAB — VARICELLA ZOSTER ANTIBODY, IGG: Varicella IgG: 619.7 Index — ABNORMAL HIGH (ref <135.00)

## 2014-11-23 ENCOUNTER — Other Ambulatory Visit: Payer: Self-pay | Admitting: Internal Medicine

## 2014-11-23 MED ORDER — CIPROFLOXACIN HCL 500 MG PO TABS: 500.0000 mg | ORAL_TABLET | Freq: Two times a day (BID) | ORAL | Status: DC

## 2014-12-13 ENCOUNTER — Ambulatory Visit: Payer: 59 | Admitting: Neurology

## 2014-12-30 ENCOUNTER — Other Ambulatory Visit: Payer: Self-pay | Admitting: Obstetrics and Gynecology

## 2015-01-11 ENCOUNTER — Telehealth: Payer: Self-pay | Admitting: Internal Medicine

## 2015-01-11 NOTE — Telephone Encounter (Signed)
Patient stated that she would go to urgent care if she was not any better after offering the patient to see the MD next week after speaking with [facility].

## 2015-01-11 NOTE — Telephone Encounter (Signed)
Next available with [facility] was after new year due to holiday schedule.  Pt stated she will see urgent care if not better.  Pt is still able to call gynecologist if needed.

## 2015-01-11 NOTE — Telephone Encounter (Signed)
Ms. Beth Wallace called saying she doesn't like the medication she's using that was prescribed by her GYN for her UTI. She's wondering if Dr. Regis Bill can prescribe something else for her. She said the medication Dr. Regis Bill has prescribed before for her UTI's seemed to work and she wants that again. She couldn't remember the name of the medication. Please give her a call regarding this.  Pt's ph# [phone] Thank you.

## 2015-01-11 NOTE — Telephone Encounter (Signed)
Spoke to the Beth Wallace.  Advised that the gynecologist would need to change medication.  Beth Wallace stated she did not want to call them and wanted to see Beth Wallace.  Sent to scheduling.

## 2015-01-11 NOTE — Telephone Encounter (Signed)
Please document what pt stated

## 2015-01-12 ENCOUNTER — Other Ambulatory Visit: Payer: Self-pay | Admitting: Gastroenterology

## 2015-01-12 ENCOUNTER — Other Ambulatory Visit: Payer: Self-pay | Admitting: Internal Medicine

## 2015-01-14 ENCOUNTER — Other Ambulatory Visit: Payer: Self-pay | Admitting: Gastroenterology

## 2015-01-14 ENCOUNTER — Telehealth: Payer: Self-pay | Admitting: Internal Medicine

## 2015-01-14 ENCOUNTER — Telehealth: Payer: Self-pay | Admitting: Gastroenterology

## 2015-01-14 MED ORDER — OMEPRAZOLE 10 MG PO CPDR: 10.0000 mg | DELAYED_RELEASE_CAPSULE | Freq: Every day | ORAL | Status: DC

## 2015-01-14 NOTE — Telephone Encounter (Signed)
Sent to the pharmacy by e-scribe. 

## 2015-01-14 NOTE — Telephone Encounter (Signed)
Ms. Beth Wallace called saying she no longer needs to see Dr. Jacinto Halim for GI issues (notated in her chart by Dr. Jacinto Halim also). Dr. Regis Bill should take over sending the Rx of Omeprazole to the pt's pharmacy. She's out of the medication and needs a refill sent to [facility] in [place], [place]. She'd like a phone call regarding this so she'll know if it can be taken care of today.  Pt's ph# [phone] Thank you.

## 2015-01-14 NOTE — Telephone Encounter (Signed)
Informed patient unless she is having reflux problems she does not have to follow up with Dr. Fuller Plan. He is fine with her PCP filling her reflux medications in the future. Informed her that she has not seen [name] since 08/2013 and unfortunately we cannot fill until she is seen in the office. Patient verbalized understanding and states she will try one more time to have it filled through her PCP since she was just seen by them. Told her to call me back if they refuse to fill medication.

## 2015-01-14 NOTE — Telephone Encounter (Signed)
She is on omeprezole  Just refilled   Ok to refill x 2

## 2015-01-14 NOTE — Telephone Encounter (Signed)
Left a message informing the pt that rx has been sent to the pharmacy.  Call back if needed.  Will be closed on Monday in observance of New Years.

## 2015-01-14 NOTE — Telephone Encounter (Signed)
Ok to rx for 4 months  Due for wellness visit early April

## 2015-02-01 ENCOUNTER — Telehealth: Payer: Self-pay | Admitting: Internal Medicine

## 2015-02-01 NOTE — Telephone Encounter (Signed)
FYI

## 2015-02-01 NOTE — Telephone Encounter (Signed)
[facility] - Client [facility] Patient Name: Beth Wallace DOB: [birth year] Initial Comment Caller States she is having urinary pressure that feels lilke she has to go when the bladder is full but it really is not, and dull back pain. Nurse Assessment Nurse: Mechele Dawley, RN, Amy Date/Time Eilene Ghazi Time): 02/01/2015 12:31:43 PM Confirm and document reason for call. If symptomatic, describe symptoms. You must click the next button to save text entered. ---SHE STATES ITS AND URGENCY THAT SHE NEEDS TO GO. SHE IS HAVING SOME DULL BACK PAIN AS WELL. Has the patient traveled out of the country within the last 30 days? ---Not Applicable Does the patient have any new or worsening symptoms? ---Yes Will a triage be completed? ---Yes Related visit to physician within the last 2 weeks? ---No Does the PT have any chronic conditions? (i.e. diabetes, asthma, etc.) ---Yes List chronic conditions. ---PLEASE SEE EPIC Did the patient indicate they were pregnant? ---No Is this a behavioral health or substance abuse call? ---No Guidelines Guideline Title Affirmed Question Affirmed Notes Urination Pain - Female Side (flank) or lower back pain present Final Disposition User See Physician within 4 Hours (or PCP triage) [place], RN, Amy Comments NO APPT AVAILABLE TODAY WITH PT'S PCP. SHE ONLY WANTED TO SEE HER OWN PCP. THEREFORE SCHEDULED HER FOR APPT ON WEDNESDAY PER REQUEST OF THE PT. Referrals REFERRED TO PCP OFFICE Disagree/Comply: Comply

## 2015-02-02 ENCOUNTER — Encounter: Payer: Self-pay | Admitting: Internal Medicine

## 2015-02-02 ENCOUNTER — Ambulatory Visit (INDEPENDENT_AMBULATORY_CARE_PROVIDER_SITE_OTHER): Payer: 59 | Admitting: Internal Medicine

## 2015-02-02 VITALS — BP 144/94 | Temp 98.3°F | Wt 292.8 lb

## 2015-02-02 DIAGNOSIS — R300 Dysuria: Secondary | ICD-10-CM | POA: Diagnosis not present

## 2015-02-02 DIAGNOSIS — R3915 Urgency of urination: Secondary | ICD-10-CM | POA: Diagnosis not present

## 2015-02-02 DIAGNOSIS — K219 Gastro-esophageal reflux disease without esophagitis: Secondary | ICD-10-CM

## 2015-02-02 DIAGNOSIS — R3129 Other microscopic hematuria: Secondary | ICD-10-CM | POA: Diagnosis not present

## 2015-02-02 DIAGNOSIS — I10 Essential (primary) hypertension: Secondary | ICD-10-CM

## 2015-02-02 DIAGNOSIS — R739 Hyperglycemia, unspecified: Secondary | ICD-10-CM

## 2015-02-02 LAB — POCT URINALYSIS DIPSTICK
BILIRUBIN UA: NEGATIVE
Blood, UA: 2
Glucose, UA: NEGATIVE
Ketones, UA: NEGATIVE
Leukocytes, UA: NEGATIVE
NITRITE UA: NEGATIVE
Protein, UA: NEGATIVE
Spec Grav, UA: >=1.03
UROBILINOGEN UA: 0.2
pH, UA: 6

## 2015-02-02 MED ORDER — CIPROFLOXACIN HCL 500 MG PO TABS: 500.0000 mg | ORAL_TABLET | Freq: Two times a day (BID) | ORAL | Status: DC

## 2015-02-02 MED ORDER — FLUCONAZOLE 150 MG PO TABS: 150.0000 mg | ORAL_TABLET | Freq: Once | ORAL | Status: DC

## 2015-02-02 MED ORDER — OMEPRAZOLE 20 MG PO CPDR: 20.0000 mg | DELAYED_RELEASE_CAPSULE | Freq: Every day | ORAL | Status: DC

## 2015-02-02 NOTE — Progress Notes (Signed)
Pre visit review using our clinic review tool, if applicable. No additional management support is needed unless otherwise documented below in the visit note.  Chief Complaint  Patient presents with  . Urinary Urgency    refrral for  nutrition to lose weight    HPI: Patient Beth Wallace  comes in today for SDA for  problem evaluation.  She has had a problem with recurrent lower urinary symptoms. Treated for UTI works best with Cipro in the fall.    Saw  Dr Philis Pique for some dysuria and felt that she got  Burning vag burning.   ? Macrobid.  The drank a lot of water . Uncertain if the medicine didn't work or she could add a side effect.   Urgency .    She's had a few days this time with increased frequency and urgency but no hematuria that is gross or severe pain  Urology   evaluation 2 years ago . And has what sounds like a cystoscopy because she is have blood in her urine for a while. At that time I don't think she was having recurrent urinary infections.  No unusual bowel habits constipation diarrhea inability to void incontinence.  Also would like a referral to nutrition and she is gaining weight and needs some help jump start getting things in the right past. She thinks she knows she needs what to do she just needs help.  She is on Prilosec from her GI doctor however when we refilled it was only 10 mg capsules and she has to take 2 can we change it to 20 g per day. ROS: See pertinent positives and negatives per HPI.  Past Medical History  Diagnosis Date  . HTN (hypertension)   . GERD (gastroesophageal reflux disease)   . Anxiety   . History of gallstones   . Obesity   . TMJ (temporomandibular joint disorder)   . ADJ DISORDER WITH MIXED ANXIETY & DEPRESSED MOOD 12/14/2009    Qualifier: Diagnosis of  By: Regis Bill MD, Standley Brooking   . Metrorrhagia 12/14/2009    Qualifier: Diagnosis of  By: Regis Bill MD, Standley Brooking With pelvic pain  Sees Dr Philis Pique and tobotic hysterectomy scheduled      Family History  Problem Relation Age of Onset  . Heart disease Mother 21    Died age 29, MI  . Heart attack Mother   . Hypertension Mother   . Hypertension Father     Living  . Diabetes Father   . Heart disease Father   . Hyperlipidemia Neg Hx   . Sudden death Neg Hx   . Other Sister     Killed, age 43  . Other Daughter     Killed, age 78    Social History   Social History  . Marital Status: Single    Spouse Name: N/A  . Number of Children: 1  . Years of Education: N/A   Occupational History  . manager    Social History Main Topics  . Smoking status: Former Smoker -- 0.25 packs/day    Types: Cigarettes    Quit date: 01/22/2012  . Smokeless tobacco: Never Used     Comment: 1-2 cigarretes daily  . Alcohol Use: No  . Drug Use: No  . Sexual Activity: Not Asked   Other Topics Concern  . 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 ALPRAZolam (XANAX) 0.25 MG tablet TAKE 1 TABLET BY MOUTH 3 TIMES A DAY AS NEEDED FOR ANXIETY 24 tablet 0  . cyclobenzaprine (FLEXERIL) 5 MG tablet Take 1 tablet (5 mg total) by mouth at bedtime as needed for muscle spasms. 30 tablet 5  . ibuprofen (ADVIL,MOTRIN) 800 MG tablet TAKE 1 TABLET BY MOUTH EVERY 8 HOURS AS NEEDED FOR CRAMPS 30 tablet 1  . lisinopril-hydrochlorothiazide (PRINZIDE,ZESTORETIC) 20-12.5 MG per tablet TAKE 1 TABLET BY MOUTH DAILY. 90 tablet 2  . omeprazole (PRILOSEC) 10 MG capsule  Take 1 capsule (10 mg total) by mouth daily. 30 capsule 3  . ciprofloxacin (CIPRO) 500 MG tablet Take 1 tablet (500 mg total) by mouth 2 (two) times daily. 10 tablet 0  . fluconazole (DIFLUCAN) 150 MG tablet Take 1 tablet (150 mg total) by mouth once. Repeat dose in 72 hours. (Patient not taking: Reported on 11/19/2014) 2 tablet 1   No facility-administered medications prior to visit.     EXAM:  BP 144/94 mmHg  Temp(Src) 98.3 F (36.8 C) (Oral)  Wt 292 lb 12.8 oz (132.813 kg)  Body mass index is 45.85 kg/(m^2).  GENERAL: vitals reviewed and listed above, alert, oriented, appears well hydrated and in no acute distress HEENT: atraumatic, conjunctiva  clear, no obvious abnormalities on inspection of external nose and ears NECK: no obvious masses on inspection palpation  LUNGS: clear to auscultation bilaterally, no wheezes, rales or rhonchi, good air movement CV: HRRR, no clubbing cyanosis or  peripheral edema nl cap refill  No CVA or flank pain abdomen soft without obvious organomegaly guarding rebound or masses that is obvious MS: moves all extremities without noticeable focal  abnormality PSYCH: pleasant and cooperative, no obvious depression or anxiety UA negative except for blood. Sent for culture ASSESSMENT AND PLAN:  Discussed the following assessment and plan:  Dysuria - Plan: CANCELED: POC Urinalysis Dipstick, CANCELED: Culture, Urine  Urinary urgency - Plan: POC Urinalysis Dipstick, Urine culture  Gastroesophageal reflux disease without esophagitis - Plan: Amb ref to Medical Nutrition Therapy-MNT  Morbid obesity, unspecified obesity type ([place]) - Plan: Amb ref to Medical Nutrition Therapy-MNT  Microhematuria - Plan: Urine culture  Essential hypertension - Plan: Amb ref to Medical Nutrition Therapy-MNT  Hyperglycemia - Plan: Amb ref to Medical Nutrition Therapy-MNT Bladder ugency seems to be the worst symptoms she says she gets these after intercourse her GYN suggested  she take Macrobid as needed for this encouraged her to do this after this probable infection is treated. She agrees. However the chart and see if there is anything else that needs to follow-up from her urology evaluation in the past Refer to nutrition as requested and change her PPI to 20 mg  grams a day. Refill her Diflucan because she reports getting yeast infections when she gets treated with an antibiotic. -Patient advised to return or notify health care team  if symptoms worsen ,persist or new concerns arise.  Patient Instructions   Rx  for lower uti   .    And agree with  prophyaxis for uti .    As discussed  Ok to referral  To dietician.  Nutrition... help with the weight  Control  . Will change omeprezole to 20 mg per day.     Standley Brooking. Panosh M.D.

## 2015-02-02 NOTE — Patient Instructions (Addendum)
  Rx  for lower uti   .    And agree with  prophyaxis for uti .    As discussed  Ok to referral  To dietician.  Nutrition... help with the weight  Control  . Will change omeprezole to 20 mg per day.

## 2015-02-04 LAB — URINE CULTURE: Colony Count: 5000

## 2015-02-09 ENCOUNTER — Encounter: Payer: Self-pay | Admitting: Internal Medicine

## 2015-02-23 ENCOUNTER — Ambulatory Visit: Payer: 59 | Admitting: Nutrition

## 2015-02-23 ENCOUNTER — Ambulatory Visit: Payer: 59 | Admitting: Gastroenterology

## 2015-03-09 ENCOUNTER — Other Ambulatory Visit: Payer: Self-pay | Admitting: Internal Medicine

## 2015-03-09 NOTE — Telephone Encounter (Signed)
Sent to the pharmacy by e-scribe.  Pt has upcoming appt on 04/15/15 for cpx

## 2015-03-18 ENCOUNTER — Ambulatory Visit: Payer: 59 | Admitting: Neurology

## 2015-03-23 ENCOUNTER — Ambulatory Visit: Payer: 59 | Admitting: Nutrition

## 2015-03-25 ENCOUNTER — Other Ambulatory Visit: Payer: Self-pay | Admitting: Urology

## 2015-03-25 ENCOUNTER — Ambulatory Visit (INDEPENDENT_AMBULATORY_CARE_PROVIDER_SITE_OTHER): Payer: 59 | Admitting: Urology

## 2015-03-25 DIAGNOSIS — R3129 Other microscopic hematuria: Secondary | ICD-10-CM | POA: Diagnosis not present

## 2015-03-25 DIAGNOSIS — R102 Pelvic and perineal pain: Secondary | ICD-10-CM

## 2015-04-07 ENCOUNTER — Ambulatory Visit: Payer: 59 | Admitting: Neurology

## 2015-04-08 ENCOUNTER — Other Ambulatory Visit (INDEPENDENT_AMBULATORY_CARE_PROVIDER_SITE_OTHER): Payer: 59

## 2015-04-08 DIAGNOSIS — Z0000 Encounter for general adult medical examination without abnormal findings: Secondary | ICD-10-CM

## 2015-04-08 LAB — CBC WITH DIFFERENTIAL/PLATELET
BASOS ABS: 0 10*3/uL (ref 0.0–0.1)
Basophils Relative: 0.6 % (ref 0.0–3.0)
EOS PCT: 1.2 % (ref 0.0–5.0)
Eosinophils Absolute: 0.1 10*3/uL (ref 0.0–0.7)
HEMATOCRIT: 38.1 % (ref 36.0–46.0)
Hemoglobin: 12.9 g/dL (ref 12.0–15.0)
LYMPHS ABS: 1.9 10*3/uL (ref 0.7–4.0)
LYMPHS PCT: 32.1 % (ref 12.0–46.0)
MCHC: 34 g/dL (ref 30.0–36.0)
MCV: 86.2 fl (ref 78.0–100.0)
MONOS PCT: 8.1 % (ref 3.0–12.0)
Monocytes Absolute: 0.5 10*3/uL (ref 0.1–1.0)
Neutro Abs: 3.5 10*3/uL (ref 1.4–7.7)
Neutrophils Relative %: 58 % (ref 43.0–77.0)
Platelets: 313 10*3/uL (ref 150.0–400.0)
RBC: 4.42 Mil/uL (ref 3.87–5.11)
RDW: 14.8 % (ref 11.5–15.5)
WBC: 6 10*3/uL (ref 4.0–10.5)

## 2015-04-08 LAB — HEPATIC FUNCTION PANEL
ALBUMIN: 4.1 g/dL (ref 3.5–5.2)
ALK PHOS: 61 U/L (ref 39–117)
ALT: 14 U/L (ref 0–35)
AST: 19 U/L (ref 0–37)
Bilirubin, Direct: 0.1 mg/dL (ref 0.0–0.3)
TOTAL PROTEIN: 7.3 g/dL (ref 6.0–8.3)
Total Bilirubin: 0.6 mg/dL (ref 0.2–1.2)

## 2015-04-08 LAB — BASIC METABOLIC PANEL
BUN: 13 mg/dL (ref 6–23)
CALCIUM: 9.4 mg/dL (ref 8.4–10.5)
CO2: 29 meq/L (ref 19–32)
Chloride: 101 mEq/L (ref 96–112)
Creatinine, Ser: 0.73 mg/dL (ref 0.40–1.20)
GFR: 109.56 mL/min (ref >60.00)
GLUCOSE: 99 mg/dL (ref 70–99)
POTASSIUM: 3.1 meq/L — AB (ref 3.5–5.1)
SODIUM: 139 meq/L (ref 135–145)

## 2015-04-08 LAB — LIPID PANEL
CHOL/HDL RATIO: 6
Cholesterol: 152 mg/dL (ref 0–200)
HDL: 27.3 mg/dL — ABNORMAL LOW (ref >39.00)
LDL Cholesterol: 100 mg/dL — ABNORMAL HIGH (ref 0–99)
NONHDL: 124.81
Triglycerides: 122 mg/dL (ref 0.0–149.0)
VLDL: 24.4 mg/dL (ref 0.0–40.0)

## 2015-04-08 LAB — TSH: TSH: 1.76 u[IU]/mL (ref 0.35–4.50)

## 2015-04-14 NOTE — Progress Notes (Signed)
Chief Complaint  Patient presents with  . Annual Exam    medications  . Hypertension    HPI: Patient  Beth Wallace  50 y.o. comes in today for Preventive Health Care visit  Weight watchers    Down 20 pounds feels pretty good  Seeing uro  And  New gyne  In [place]  Bp is good  No cramps taking daily  Headaches felt to be from neck and workingon it with exercises etc   Health Maintenance  Topic Date Due  . HIV Screening  07/21/1982  . MAMMOGRAM  08/16/2014  . INFLUENZA VACCINE  08/16/2015  . PAP SMEAR  09/08/2016  . TETANUS/TDAP  05/16/2019   Health Maintenance Review LIFESTYLE:  Exercise:   10 k steps Tobacco/ETS: light  2 per day . Alcohol: no Sugar beverages: no Sleep: 5-6  Hours  Drug use: no Work 10  Hours  per day .   ROS:  GEN/ HEENT: No fever, significant weight changes sweats headaches vision problems hearing changes, CV/ PULM; No chest pain shortness of breath cough, syncope,edema  change in exercise tolerance. GI /GU: No adominal pain, vomiting, change in bowel habits. No blood in the stool. No significant GU symptoms. SKIN/HEME: ,no acute skin rashes suspicious lesions or bleeding. No lymphadenopathy, nodules, masses.  NEURO/ PSYCH:  No neurologic signs such as weakness numbness. No depression anxiety. IMM/ Allergy: No unusual infections.  Allergy .   REST of 12 system review negative except as per HPI   Past Medical History  Diagnosis Date  . HTN (hypertension)   . GERD (gastroesophageal reflux disease)   . Anxiety   . History of gallstones   . Obesity   . TMJ (temporomandibular joint disorder)   . ADJ DISORDER WITH MIXED ANXIETY & DEPRESSED MOOD 12/14/2009    Qualifier: Diagnosis of  By: Regis Bill MD, Standley Brooking   . Metrorrhagia 12/14/2009    Qualifier: Diagnosis of  By: Regis Bill MD, Standley Brooking With pelvic pain  Sees Dr Philis Pique and tobotic hysterectomy scheduled     Past Surgical History  Procedure Laterality Date  . Cholecystectomy  1989  .  Cesarean section    . Wisdom tooth extraction  2010  . Abdominal hysterectomy  09/2010    partial    Family History  Problem Relation Age of Onset  . Heart disease Mother 37    Died age 73, MI  . Heart attack Mother   . Hypertension Mother   . Hypertension Father     Living  . Diabetes Father   . Heart disease Father   . Hyperlipidemia Neg Hx   . Sudden death Neg Hx   . Other Sister     Killed, age 28  . Other Daughter     Killed, age 42    Social History   Social History  . Marital Status: Single    Spouse Name: N/A  . Number of Children: 1  . Years of Education: N/A   Occupational History  . manager    Social History Main Topics  . Smoking status: Former Smoker -- 0.25 packs/day    Types: Cigarettes    Quit date: 01/22/2012  . Smokeless tobacco: Never Used     Comment: 1-2 cigarretes daily  . Alcohol Use: No  . Drug Use: No  . Sexual Activity: Not Asked   Other Topics Concern  . 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 ALPRAZolam (XANAX) 0.25 MG tablet TAKE 1 TABLET BY MOUTH 3 TIMES A DAY AS NEEDED FOR ANXIETY 24 tablet 0  . cyclobenzaprine (AMRIX) 15 MG 24 hr capsule Take 1 capsule (15 mg total) by mouth daily as needed for muscle spasms. 20 capsule 1  . diazepam (VALIUM) 5 MG tablet Take 5 mg by mouth as needed.  0  . ibuprofen (ADVIL,MOTRIN) 800 MG tablet TAKE 1 TABLET BY MOUTH EVERY 8 HOURS AS NEEDED FOR CRAMPS 30  tablet 1  . metroNIDAZOLE (FLAGYL) 500 MG tablet Take 500 mg by mouth 2 (two) times daily.  0  . nitrofurantoin (MACRODANTIN) 50 MG capsule     . omeprazole (PRILOSEC) 20 MG capsule Take 1 capsule (20 mg total) by mouth daily. 30 capsule 6  . lisinopril-hydrochlorothiazide (PRINZIDE,ZESTORETIC) 20-12.5 MG tablet TAKE 1 TABLET BY MOUTH EVERY DAY 90 tablet 0  . ciprofloxacin (CIPRO) 500 MG tablet Take 1 tablet (500 mg total) by mouth 2 (two) times daily. 6 tablet 0  . cyclobenzaprine (FLEXERIL) 5 MG tablet Take 1 tablet (5 mg total) by mouth at bedtime as needed for muscle spasms. 30 tablet 5  . fluconazole (DIFLUCAN) 150 MG tablet Take 1 tablet (150 mg total) by mouth once. May Repeat dose in 72 hours. 2 tablet 0   No facility-administered medications prior to visit.     EXAM:  BP 118/72 mmHg  Pulse 79  Temp(Src) 98.2 F (36.8 C) (Oral)  Resp 12  Ht 5' 8"  (1.727 m)  Wt 277 lb 8 oz (125.873 kg)  BMI 42.20 kg/m2  SpO2 98%  Body mass index is 42.2 kg/(m^2).  Physical Exam: Vital signs reviewed GYK:ZLDJ is a well-developed well-nourished alert cooperative    who appearsr stated age in no acute distress.  HEENT: normocephalic atraumatic , Eyes: PERRL EOM's full, conjunctiva clear, Nares: paten,t no deformity discharge or tenderness., Ears: no deformity EAC's clear TMs with normal landmarks. Mouth: clear OP, no lesions, edema.  Moist mucous membranes. Dentition in adequate repair. NECK: supple without masses, thyromegaly or bruits. CHEST/PULM:  Clear to auscultation and percussion breath sounds equal no wheeze , rales or rhonchi. No chest wall deformities or tenderness. CV: PMI is nondisplaced, S1 S2 no gallops, murmurs, rubs. Peripheral pulses are full without delay.No JVD .  ABDOMEN: Bowel sounds normal nontender  No guard or rebound, no hepato splenomegal no CVA tenderness.  No hernia. Extremtities:  No clubbing cyanosis or edema, no acute joint swelling or redness no focal  atrophy NEURO:  Oriented x3, cranial nerves 3-12 appear to be intact, no obvious focal weakness,gait within normal limits no abnormal reflexes or asymmetrical SKIN: No  acute rashes normal turgor, color, no bruising or petechiae. PSYCH: Oriented, good eye contact, no obvious depression anxiety, cognition and judgment appear normal. LN: no cervical axillary inguinal adenopathy  Lab Results  Component Value Date   WBC 6.0 04/08/2015   HGB 12.9 04/08/2015   HCT 38.1 04/08/2015   PLT 313.0 04/08/2015   GLUCOSE 99 04/08/2015   CHOL 152 04/08/2015   TRIG 122.0 04/08/2015   HDL 27.30* 04/08/2015   LDLCALC 100* 04/08/2015   ALT 14 04/08/2015   AST 19 04/08/2015   NA 139 04/08/2015   K 3.1* 04/08/2015   CL 101 04/08/2015   CREATININE 0.73 04/08/2015   BUN 13 04/08/2015   CO2 29 04/08/2015   TSH 1.76 04/08/2015   HGBA1C 6.1 11/19/2014   MICROALBUR <0.7 11/19/2014   Wt Readings from Last 3 Encounters:  04/15/15 277 lb 8 oz (125.873 kg)  04/15/15 277 lb (125.646 kg)  02/02/15 292 lb 12.8 oz (132.813 kg)     ASSESSMENT AND PLAN:  Discussed the following assessment and plan:  Visit for preventive health examination  Medication management  Essential hypertension - controlled  lsi pat wants to poss   dec med diuretic if continueing success with lsi   Hyperglycemia - better!  Morbid obesity, unspecified obesity type ([place]) - 15# weight loss  keep going with ww etc   Hypokalemia - diet high is fruits add potassium bmp magin 3-4 weeks  Low HDL (under 40) - no tobacco uncertain serverity of drop ? lab error will follow with weigh tloss and get to tobacco free   Expectant management.  encouragement  For healthy ls Patient Care Team: Burnis Medin, MD as PCP - General Ladene Artist, MD as Attending Physician (Gastroenterology) Patient Instructions  Continue lifestyle intervention healthy eating and exercise . Potassium is low   And will add potassium supp and recheck  In lab  3-4 weeks   Health Maintenance, Female Adopting a healthy lifestyle and getting preventive care can go a long way to promote health and wellness. Talk with your health care provider about what schedule of regular examinations is right for you. This is a good chance for you to check in with your provider about disease prevention and staying healthy. In between checkups, there are plenty of things you can do on your own. Experts have done a lot of research about which lifestyle changes and preventive measures are most likely to keep you healthy. Ask your health care provider for more information. WEIGHT AND DIET  Eat a healthy diet  Be sure to include plenty of vegetables, fruits, low-fat dairy products, and lean protein.  Do not eat a lot of foods high in solid fats, added sugars, or salt.  Get regular exercise. This is one of the most important things you can do for your health.  Most adults should exercise for at least 150 minutes each week. The exercise should increase your heart rate and make you sweat (moderate-intensity exercise).  Most adults should also do strengthening exercises at least twice a week. This is in addition to the moderate-intensity exercise.  Maintain a healthy weight  Body mass index (BMI) is a measurement that can be used to identify possible weight problems. It estimates body fat based on height and weight. Your health care provider can help determine your BMI and help you achieve or maintain a healthy weight.  For females 78 years of age and older:   A BMI below 18.5 is considered underweight.  A BMI of 18.5 to 24.9 is normal.  A BMI of 25 to 29.9 is considered overweight.  A BMI of 30 and above is considered obese.  Watch levels of cholesterol and blood lipids  You should start having your blood tested for lipids and cholesterol at 48 years of age, then have this test every 5 years.  You may need to have your cholesterol levels checked more often  if:  Your lipid or cholesterol levels are high.  You are older than 48 years of age.  You are at high risk for heart disease.  CANCER SCREENING   Lung Cancer  Lung cancer screening is recommended for adults 97-63 years old who are at high risk for lung cancer because of a history of smoking.  A yearly low-dose CT scan of the lungs is recommended for people who:  Currently smoke.  Have quit within the past 15 years.  Have at least a 30-pack-year history of smoking. A pack year is smoking an average of one pack of cigarettes a day for 1 year.  Yearly screening should continue until it has been 15 years since you quit.  Yearly screening should stop if you develop a health problem that would prevent you from having lung cancer treatment.  Breast Cancer  Practice breast self-awareness. This means understanding how your breasts normally appear and feel.  It also means doing regular breast self-exams. Let your health care provider know about any changes, no matter how small.  If you are in your 20s or 30s, you should have a clinical breast exam (CBE) by a health care provider every 1-3 years as part of a regular health exam.  If you are 51 or older, have a CBE every year. Also consider having a breast X-ray (mammogram) every year.  If you have a family history of breast cancer, talk to your health care provider about genetic screening.  If you are at high risk for breast cancer, talk to your health care provider about having an MRI and a mammogram every year.  Breast cancer gene (BRCA) assessment is recommended for women who have family members with BRCA-related cancers. BRCA-related cancers include:  Breast.  Ovarian.  Tubal.  Peritoneal cancers.  Results of the assessment will determine the need for genetic counseling and BRCA1 and BRCA2 testing. Cervical Cancer Your health care provider may recommend that you be screened regularly for cancer of the pelvic organs  (ovaries, uterus, and vagina). This screening involves a pelvic examination, including checking for microscopic changes to the surface of your cervix (Pap test). You may be encouraged to have this screening done every 3 years, beginning at age 39.  For women ages 12-65, health care providers may recommend pelvic exams and Pap testing every 3 years, or they may recommend the Pap and pelvic exam, combined with testing for human papilloma virus (HPV), every 5 years. Some types of HPV increase your risk of cervical cancer. Testing for HPV may also be done on women of any age with unclear Pap test results.  Other health care providers may not recommend any screening for nonpregnant women who are considered low risk for pelvic cancer and who do not have symptoms. Ask your health care provider if a screening pelvic exam is right for you.  If you have had past treatment for cervical cancer or a condition that could lead to cancer, you need Pap tests and screening for cancer for at least 20 years after your treatment. If Pap tests  have been discontinued, your risk factors (such as having a new sexual partner) need to be reassessed to determine if screening should resume. Some women have medical problems that increase the chance of getting cervical cancer. In these cases, your health care provider may recommend more frequent screening and Pap tests. Colorectal Cancer  This type of cancer can be detected and often prevented.  Routine colorectal cancer screening usually begins at 48 years of age and continues through 48 years of age.  Your health care provider may recommend screening at an earlier age if you have risk factors for colon cancer.  Your health care provider may also recommend using home test kits to check for hidden blood in the stool.  A small camera at the end of a tube can be used to examine your colon directly (sigmoidoscopy or colonoscopy). This is done to check for the earliest forms of  colorectal cancer.  Routine screening usually begins at age 77.  Direct examination of the colon should be repeated every 5-10 years through 48 years of age. However, you may need to be screened more often if early forms of precancerous polyps or small growths are found. Skin Cancer  Check your skin from head to toe regularly.  Tell your health care provider about any new moles or changes in moles, especially if there is a change in a mole's shape or color.  Also tell your health care provider if you have a mole that is larger than the size of a pencil eraser.  Always use sunscreen. Apply sunscreen liberally and repeatedly throughout the day.  Protect yourself by wearing long sleeves, pants, a wide-brimmed hat, and sunglasses whenever you are outside. HEART DISEASE, DIABETES, AND HIGH BLOOD PRESSURE   High blood pressure causes heart disease and increases the risk of stroke. High blood pressure is more likely to develop in:  People who have blood pressure in the high end of the normal range (130-139/85-89 mm Hg).  People who are overweight or obese.  People who are African American.  If you are 67-53 years of age, have your blood pressure checked every 3-5 years. If you are 43 years of age or older, have your blood pressure checked every year. You should have your blood pressure measured twice--once when you are at a hospital or clinic, and once when you are not at a hospital or clinic. Record the average of the two measurements. To check your blood pressure when you are not at a hospital or clinic, you can use:  An automated blood pressure machine at a pharmacy.  A home blood pressure monitor.  If you are between 70 years and 40 years old, ask your health care provider if you should take aspirin to prevent strokes.  Have regular diabetes screenings. This involves taking a blood sample to check your fasting blood sugar level.  If you are at a normal weight and have a low risk for  diabetes, have this test once every three years after 48 years of age.  If you are overweight and have a high risk for diabetes, consider being tested at a younger age or more often. PREVENTING INFECTION  Hepatitis B  If you have a higher risk for hepatitis B, you should be screened for this virus. You are considered at high risk for hepatitis B if:  You were born in a country where hepatitis B is common. Ask your health care provider which countries are considered high risk.  Your parents were born  in a high-risk country, and you have not been immunized against hepatitis B (hepatitis B vaccine).  You have HIV or AIDS.  You use needles to inject street drugs.  You live with someone who has hepatitis B.  You have had sex with someone who has hepatitis B.  You get hemodialysis treatment.  You take certain medicines for conditions, including cancer, organ transplantation, and autoimmune conditions. Hepatitis C  Blood testing is recommended for:  Everyone born from [birth year] through 1963.  Anyone with known risk factors for hepatitis C. Sexually transmitted infections (STIs)  You should be screened for sexually transmitted infections (STIs) including gonorrhea and chlamydia if:  You are sexually active and are younger than 48 years of age.  You are older than 48 years of age and your health care provider tells you that you are at risk for this type of infection.  Your sexual activity has changed since you were last screened and you are at an increased risk for chlamydia or gonorrhea. Ask your health care provider if you are at risk.  If you do not have HIV, but are at risk, it may be recommended that you take a prescription medicine daily to prevent HIV infection. This is called pre-exposure prophylaxis (PrEP). You are considered at risk if:  You are sexually active and do not regularly use condoms or know the HIV status of your partner(s).  You take drugs by injection.  You are  sexually active with a partner who has HIV. Talk with your health care provider about whether you are at high risk of being infected with HIV. If you choose to begin PrEP, you should first be tested for HIV. You should then be tested every 3 months for as long as you are taking PrEP.  PREGNANCY   If you are premenopausal and you may become pregnant, ask your health care provider about preconception counseling.  If you may become pregnant, take 400 to 800 micrograms (mcg) of folic acid every day.  If you want to prevent pregnancy, talk to your health care provider about birth control (contraception). OSTEOPOROSIS AND MENOPAUSE   Osteoporosis is a disease in which the bones lose minerals and strength with aging. This can result in serious bone fractures. Your risk for osteoporosis can be identified using a bone density scan.  If you are 73 years of age or older, or if you are at risk for osteoporosis and fractures, ask your health care provider if you should be screened.  Ask your health care provider whether you should take a calcium or vitamin D supplement to lower your risk for osteoporosis.  Menopause may have certain physical symptoms and risks.  Hormone replacement therapy may reduce some of these symptoms and risks. Talk to your health care provider about whether hormone replacement therapy is right for you.  HOME CARE INSTRUCTIONS   Schedule regular health, dental, and eye exams.  Stay current with your immunizations.   Do not use any tobacco products including cigarettes, chewing tobacco, or electronic cigarettes.  If you are pregnant, do not drink alcohol.  If you are breastfeeding, limit how much and how often you drink alcohol.  Limit alcohol intake to no more than 1 drink per day for nonpregnant women. One drink equals 12 ounces of beer, 5 ounces of wine, or 1 ounces of hard liquor.  Do not use street drugs.  Do not share needles.  Ask your health care provider for  help if you need support or  information about quitting drugs.  Tell your health care provider if you often feel depressed.  Tell your health care provider if you have ever been abused or do not feel safe at home.   This information is not intended to replace advice given to you by your health care provider. Make sure you discuss any questions you have with your health care provider.   Document Released: 07/17/2010 Document Revised: 01/22/2014 Document Reviewed: 12/03/2012 Elsevier Interactive Patient Education 2016 [place] K. Panosh M.D.

## 2015-04-15 ENCOUNTER — Ambulatory Visit (INDEPENDENT_AMBULATORY_CARE_PROVIDER_SITE_OTHER): Payer: 59 | Admitting: Neurology

## 2015-04-15 ENCOUNTER — Telehealth: Payer: Self-pay | Admitting: Neurology

## 2015-04-15 ENCOUNTER — Ambulatory Visit (INDEPENDENT_AMBULATORY_CARE_PROVIDER_SITE_OTHER): Payer: 59 | Admitting: Internal Medicine

## 2015-04-15 ENCOUNTER — Encounter: Payer: Self-pay | Admitting: Neurology

## 2015-04-15 VITALS — BP 118/72 | HR 79 | Temp 98.2°F | Resp 12 | Ht 68.0 in | Wt 277.5 lb

## 2015-04-15 VITALS — BP 128/80 | HR 74 | Ht 67.0 in | Wt 277.0 lb

## 2015-04-15 DIAGNOSIS — E876 Hypokalemia: Secondary | ICD-10-CM

## 2015-04-15 DIAGNOSIS — R739 Hyperglycemia, unspecified: Secondary | ICD-10-CM

## 2015-04-15 DIAGNOSIS — R51 Headache: Secondary | ICD-10-CM | POA: Diagnosis not present

## 2015-04-15 DIAGNOSIS — Z0000 Encounter for general adult medical examination without abnormal findings: Secondary | ICD-10-CM | POA: Diagnosis not present

## 2015-04-15 DIAGNOSIS — E786 Lipoprotein deficiency: Secondary | ICD-10-CM

## 2015-04-15 DIAGNOSIS — Z79899 Other long term (current) drug therapy: Secondary | ICD-10-CM

## 2015-04-15 DIAGNOSIS — G4486 Cervicogenic headache: Secondary | ICD-10-CM

## 2015-04-15 DIAGNOSIS — I10 Essential (primary) hypertension: Secondary | ICD-10-CM

## 2015-04-15 MED ORDER — LISINOPRIL-HYDROCHLOROTHIAZIDE 20-12.5 MG PO TABS: 1.0000 | ORAL_TABLET | Freq: Every day | ORAL | Status: DC

## 2015-04-15 MED ORDER — POTASSIUM CHLORIDE CRYS ER 20 MEQ PO TBCR: 20.0000 meq | EXTENDED_RELEASE_TABLET | Freq: Once | ORAL | Status: DC

## 2015-04-15 MED ORDER — CYCLOBENZAPRINE HCL ER 15 MG PO CP24: 15.0000 mg | ORAL_CAPSULE | Freq: Every day | ORAL | Status: DC | PRN

## 2015-04-15 NOTE — Progress Notes (Signed)
Follow-up Visit   Date: 04/15/2015    Beth Wallace MRN: [id number] DOB: Jun 05, 1965   Interim History: [name] is a 50 y.o. right-handed African American female with hypertension, seasonal allergies, and GERD returning to the clinic for follow-up of cervicogenic headaches.  The patient was accompanied to the clinic by self.  History of present illness: Starting around January 2016, she started developing pressure over the vertex of the head and ocassionally over the forehead. Pain is improved by walking and resting. It is worse when she is working and lasts several hours. It can occur daily but she usually does not notice the discomfort when she is not working. No photophobia, phonophobia, nasuea, vomiting. Pain is ranked as 4-5/10. It does not limit her daily functioning, but is annoying. She has tried excedrin migraine, aleve, and tylenol none which provided significant relief. She was given prednisone injection but this did not help either.   She went to her allergist and initially thought it was due allergies. She then went to the emergency department and told to follow-up with ENT. She was told she had a enlarged turbinate and deviated septum. She works on her computer for 12-14 days and was told that headaches may simply be tension.   She previously had migraine when pregnant which would be associated with photophobia and phonophobia.   She is a delightful lady who unfortunately has lost her 48 year old daughter and younger sister in a triple homicide. She has a tremendous strength and integrity of character and has managed to deal with such tragedy in an effective manner.   UPDATE 04/15/2015:  She has been doing neck physiotherapy and reports noticing at least 50% improvement in her head pressure.  She has been going twice per week.  She reports having neck and head pain once every two weeks, which is relieved with ibuprofen. She stopped taking flexeril due to  increased sedation and lingering cognitive side effects.   She denies any new neurological complaints.  She has started weight watchers and lost 17lb since early 2017!  Medications:  Current Outpatient Prescriptions on File Prior to Visit  Medication Sig Dispense Refill  . ALPRAZolam (XANAX) 0.25 MG tablet TAKE 1 TABLET BY MOUTH 3 TIMES A DAY AS NEEDED FOR ANXIETY 24 tablet 0  . ibuprofen (ADVIL,MOTRIN) 800 MG tablet TAKE 1 TABLET BY MOUTH EVERY 8 HOURS AS NEEDED FOR CRAMPS 30 tablet 1  . lisinopril-hydrochlorothiazide (PRINZIDE,ZESTORETIC) 20-12.5 MG tablet TAKE 1 TABLET BY MOUTH EVERY DAY 90 tablet 0  . omeprazole (PRILOSEC) 20 MG capsule Take 1 capsule (20 mg total) by mouth daily. 30 capsule 6   No current facility-administered medications on file prior to visit.    Allergies:  Allergies  Allergen Reactions  . Bee Venom Shortness Of Breath  . Contrast Media [Iodinated Diagnostic Agents] Anaphylaxis    CT dye ivp Throat swelling    Review of Systems:  CONSTITUTIONAL: No fevers, chills, night sweats, or weight loss.  EYES: No visual changes or eye pain ENT: No hearing changes.  No history of nose bleeds.   RESPIRATORY: No cough, wheezing and shortness of breath.   CARDIOVASCULAR: Negative for chest pain, and palpitations.   GI: Negative for abdominal discomfort, blood in stools or black stools.  No recent change in bowel habits.   GU:  No history of incontinence.   MUSCLOSKELETAL: No history of joint pain or swelling.  No myalgias.   SKIN: Negative for lesions, rash, and itching.   ENDOCRINE: Negative  for cold or heat intolerance, polydipsia or goiter.   PSYCH:  No depression or anxiety symptoms.   NEURO: As Above.   Vital Signs:  BP 128/80 mmHg  Pulse 74  Ht 5\' 7"  (1.702 m)  Wt 277 lb (125.646 kg)  BMI 43.37 kg/m2  SpO2 98% Pain Scale: 0 on a scale of 0-10  Neurological Exam: MENTAL STATUS including orientation to time, place, person, recent and remote memory,  attention span and concentration, language, and fund of knowledge is normal.  Speech is not dysarthric.  CRANIAL NERVES: Pupils equal round and reactive to light.  Normal conjugate, extra-ocular eye movements in all directions of gaze.  No ptosis. Normal facial sensation.  Face is symmetric. Palate elevates symmetrically.  Tongue is midline.  MOTOR:  Motor strength is 5/5 in all extremities.  She continues to have marked cervical muscle tension and reduced neck ROM especially with flexion and extension.  Tone is normal.    COORDINATION/GAIT:   Gait narrow based and stable.   Data: XR cervical spine 12/30/2013: Mild spondylosis at C5-6. No acute osseous abnormality.  IMPRESSION: 1.  Cervicogenic headaches, improving.  She stopped taking flexeril due to increased sedation and lingering cognitive side effects, so will switch her to Amrix in hopes to minimize adverse effects.   2.  Morbid obesity, praised her for her weight loss and encouraged to continue with her exercise program and diet changes  PLAN/RECOMMENDATIONS:  1.  Start Amrix 15mg  daily as needed for cervicalgia and neck spasm 2.  Discontinue flexeril 3.  Continue neck physiotherapy  Return to clinic in 6 months  The duration of this appointment visit was 25 minutes of face-to-face time with the patient.  Greater than 50% of this time was spent in counseling, explanation of diagnosis, planning of further management, and coordination of care.   Thank you for allowing me to participate in patient's care.  If I can answer any additional questions, I would be pleased to do so.    Sincerely,    Jaymz Traywick K. Posey Pronto, DO

## 2015-04-15 NOTE — Telephone Encounter (Signed)
Left message for patient to call me back. 

## 2015-04-15 NOTE — Patient Instructions (Signed)
1.  Start Amrix 15mg  daily as needed for cervicalgia and neck spasm 2.  Discontinue flexeril 3.  Continue neck physiotherapy  Return to clinic in 6 months

## 2015-04-15 NOTE — Telephone Encounter (Addendum)
Patient's pharmacy called and would like you to call them back at 1 [phone]. She is needing the ok to switch the medication due to side effects and insurance not paying.  I believe she said insurance will pay for Zanadine. Please call to verify. Her name is Beth Wallace 02/04/1965

## 2015-04-15 NOTE — Patient Instructions (Signed)
Continue lifestyle intervention healthy eating and exercise . Potassium is low   And will add potassium supp and recheck  In lab 3-4 weeks   Health Maintenance, Female Adopting a healthy lifestyle and getting preventive care can go a long way to promote health and wellness. Talk with your health care provider about what schedule of regular examinations is right for you. This is a good chance for you to check in with your provider about disease prevention and staying healthy. In between checkups, there are plenty of things you can do on your own. Experts have done a lot of research about which lifestyle changes and preventive measures are most likely to keep you healthy. Ask your health care provider for more information. WEIGHT AND DIET  Eat a healthy diet  Be sure to include plenty of vegetables, fruits, low-fat dairy products, and lean protein.  Do not eat a lot of foods high in solid fats, added sugars, or salt.  Get regular exercise. This is one of the most important things you can do for your health.  Most adults should exercise for at least 150 minutes each week. The exercise should increase your heart rate and make you sweat (moderate-intensity exercise).  Most adults should also do strengthening exercises at least twice a week. This is in addition to the moderate-intensity exercise.  Maintain a healthy weight  Body mass index (BMI) is a measurement that can be used to identify possible weight problems. It estimates body fat based on height and weight. Your health care provider can help determine your BMI and help you achieve or maintain a healthy weight.  For females 53 years of age and older:   A BMI below 18.5 is considered underweight.  A BMI of 18.5 to 24.9 is normal.  A BMI of 25 to 29.9 is considered overweight.  A BMI of 30 and above is considered obese.  Watch levels of cholesterol and blood lipids  You should start having your blood tested for lipids and  cholesterol at 49 years of age, then have this test every 5 years.  You may need to have your cholesterol levels checked more often if:  Your lipid or cholesterol levels are high.  You are older than 48 years of age.  You are at high risk for heart disease.  CANCER SCREENING   Lung Cancer  Lung cancer screening is recommended for adults 45-18 years old who are at high risk for lung cancer because of a history of smoking.  A yearly low-dose CT scan of the lungs is recommended for people who:  Currently smoke.  Have quit within the past 15 years.  Have at least a 30-pack-year history of smoking. A pack year is smoking an average of one pack of cigarettes a day for 1 year.  Yearly screening should continue until it has been 15 years since you quit.  Yearly screening should stop if you develop a health problem that would prevent you from having lung cancer treatment.  Breast Cancer  Practice breast self-awareness. This means understanding how your breasts normally appear and feel.  It also means doing regular breast self-exams. Let your health care provider know about any changes, no matter how small.  If you are in your 20s or 30s, you should have a clinical breast exam (CBE) by a health care provider every 1-3 years as part of a regular health exam.  If you are 18 or older, have a CBE every year. Also consider having a  breast X-ray (mammogram) every year.  If you have a family history of breast cancer, talk to your health care provider about genetic screening.  If you are at high risk for breast cancer, talk to your health care provider about having an MRI and a mammogram every year.  Breast cancer gene (BRCA) assessment is recommended for women who have family members with BRCA-related cancers. BRCA-related cancers include:  Breast.  Ovarian.  Tubal.  Peritoneal cancers.  Results of the assessment will determine the need for genetic counseling and BRCA1 and BRCA2  testing. Cervical Cancer Your health care provider may recommend that you be screened regularly for cancer of the pelvic organs (ovaries, uterus, and vagina). This screening involves a pelvic examination, including checking for microscopic changes to the surface of your cervix (Pap test). You may be encouraged to have this screening done every 3 years, beginning at age 21.  For women ages 30-65, health care providers may recommend pelvic exams and Pap testing every 3 years, or they may recommend the Pap and pelvic exam, combined with testing for human papilloma virus (HPV), every 5 years. Some types of HPV increase your risk of cervical cancer. Testing for HPV may also be done on women of any age with unclear Pap test results.  Other health care providers may not recommend any screening for nonpregnant women who are considered low risk for pelvic cancer and who do not have symptoms. Ask your health care provider if a screening pelvic exam is right for you.  If you have had past treatment for cervical cancer or a condition that could lead to cancer, you need Pap tests and screening for cancer for at least 20 years after your treatment. If Pap tests have been discontinued, your risk factors (such as having a new sexual partner) need to be reassessed to determine if screening should resume. Some women have medical problems that increase the chance of getting cervical cancer. In these cases, your health care provider may recommend more frequent screening and Pap tests. Colorectal Cancer  This type of cancer can be detected and often prevented.  Routine colorectal cancer screening usually begins at 48 years of age and continues through 48 years of age.  Your health care provider may recommend screening at an earlier age if you have risk factors for colon cancer.  Your health care provider may also recommend using home test kits to check for hidden blood in the stool.  A small camera at the end of a  tube can be used to examine your colon directly (sigmoidoscopy or colonoscopy). This is done to check for the earliest forms of colorectal cancer.  Routine screening usually begins at age 50.  Direct examination of the colon should be repeated every 5-10 years through 48 years of age. However, you may need to be screened more often if early forms of precancerous polyps or small growths are found. Skin Cancer  Check your skin from head to toe regularly.  Tell your health care provider about any new moles or changes in moles, especially if there is a change in a mole's shape or color.  Also tell your health care provider if you have a mole that is larger than the size of a pencil eraser.  Always use sunscreen. Apply sunscreen liberally and repeatedly throughout the day.  Protect yourself by wearing long sleeves, pants, a wide-brimmed hat, and sunglasses whenever you are outside. HEART DISEASE, DIABETES, AND HIGH BLOOD PRESSURE   High blood pressure causes   heart disease and increases the risk of stroke. High blood pressure is more likely to develop in:  People who have blood pressure in the high end of the normal range (130-139/85-89 mm Hg).  People who are overweight or obese.  People who are African American.  If you are 18-39 years of age, have your blood pressure checked every 3-5 years. If you are 40 years of age or older, have your blood pressure checked every year. You should have your blood pressure measured twice--once when you are at a hospital or clinic, and once when you are not at a hospital or clinic. Record the average of the two measurements. To check your blood pressure when you are not at a hospital or clinic, you can use:  An automated blood pressure machine at a pharmacy.  A home blood pressure monitor.  If you are between 55 years and 79 years old, ask your health care provider if you should take aspirin to prevent strokes.  Have regular diabetes screenings. This  involves taking a blood sample to check your fasting blood sugar level.  If you are at a normal weight and have a low risk for diabetes, have this test once every three years after 48 years of age.  If you are overweight and have a high risk for diabetes, consider being tested at a younger age or more often. PREVENTING INFECTION  Hepatitis B  If you have a higher risk for hepatitis B, you should be screened for this virus. You are considered at high risk for hepatitis B if:  You were born in a country where hepatitis B is common. Ask your health care provider which countries are considered high risk.  Your parents were born in a high-risk country, and you have not been immunized against hepatitis B (hepatitis B vaccine).  You have HIV or AIDS.  You use needles to inject street drugs.  You live with someone who has hepatitis B.  You have had sex with someone who has hepatitis B.  You get hemodialysis treatment.  You take certain medicines for conditions, including cancer, organ transplantation, and autoimmune conditions. Hepatitis C  Blood testing is recommended for:  Everyone born from 1945 through 1965.  Anyone with known risk factors for hepatitis C. Sexually transmitted infections (STIs)  You should be screened for sexually transmitted infections (STIs) including gonorrhea and chlamydia if:  You are sexually active and are younger than 48 years of age.  You are older than 48 years of age and your health care provider tells you that you are at risk for this type of infection.  Your sexual activity has changed since you were last screened and you are at an increased risk for chlamydia or gonorrhea. Ask your health care provider if you are at risk.  If you do not have HIV, but are at risk, it may be recommended that you take a prescription medicine daily to prevent HIV infection. This is called pre-exposure prophylaxis (PrEP). You are considered at risk if:  You are  sexually active and do not regularly use condoms or know the HIV status of your partner(s).  You take drugs by injection.  You are sexually active with a partner who has HIV. Talk with your health care provider about whether you are at high risk of being infected with HIV. If you choose to begin PrEP, you should first be tested for HIV. You should then be tested every 3 months for as long as you are   taking PrEP.  PREGNANCY   If you are premenopausal and you may become pregnant, ask your health care provider about preconception counseling.  If you may become pregnant, take 400 to 800 micrograms (mcg) of folic acid every day.  If you want to prevent pregnancy, talk to your health care provider about birth control (contraception). OSTEOPOROSIS AND MENOPAUSE   Osteoporosis is a disease in which the bones lose minerals and strength with aging. This can result in serious bone fractures. Your risk for osteoporosis can be identified using a bone density scan.  If you are 65 years of age or older, or if you are at risk for osteoporosis and fractures, ask your health care provider if you should be screened.  Ask your health care provider whether you should take a calcium or vitamin D supplement to lower your risk for osteoporosis.  Menopause may have certain physical symptoms and risks.  Hormone replacement therapy may reduce some of these symptoms and risks. Talk to your health care provider about whether hormone replacement therapy is right for you.  HOME CARE INSTRUCTIONS   Schedule regular health, dental, and eye exams.  Stay current with your immunizations.   Do not use any tobacco products including cigarettes, chewing tobacco, or electronic cigarettes.  If you are pregnant, do not drink alcohol.  If you are breastfeeding, limit how much and how often you drink alcohol.  Limit alcohol intake to no more than 1 drink per day for nonpregnant women. One drink equals 12 ounces of beer, 5  ounces of wine, or 1 ounces of hard liquor.  Do not use street drugs.  Do not share needles.  Ask your health care provider for help if you need support or information about quitting drugs.  Tell your health care provider if you often feel depressed.  Tell your health care provider if you have ever been abused or do not feel safe at home.   This information is not intended to replace advice given to you by your health care provider. Make sure you discuss any questions you have with your health care provider.   Document Released: 07/17/2010 Document Revised: 01/22/2014 Document Reviewed: 12/03/2012 Elsevier Interactive Patient Education 2016 [employer]  

## 2015-04-16 ENCOUNTER — Encounter: Payer: Self-pay | Admitting: Internal Medicine

## 2015-04-18 ENCOUNTER — Ambulatory Visit (HOSPITAL_COMMUNITY)
Admission: RE | Admit: 2015-04-18 | Discharge: 2015-04-18 | Disposition: A | Discharge status: Home / Self Care | Payer: 59 | Source: Ambulatory Visit | Attending: Diagnostic Radiology | Admitting: Diagnostic Radiology

## 2015-04-18 ENCOUNTER — Other Ambulatory Visit (HOSPITAL_COMMUNITY): Payer: Self-pay | Admitting: Diagnostic Radiology

## 2015-04-18 ENCOUNTER — Ambulatory Visit (HOSPITAL_COMMUNITY)
Admission: RE | Admit: 2015-04-18 | Discharge: 2015-04-18 | Disposition: A | Discharge status: Home / Self Care | Payer: 59 | Source: Ambulatory Visit | Attending: Urology | Admitting: Urology

## 2015-04-18 DIAGNOSIS — N644 Mastodynia: Secondary | ICD-10-CM

## 2015-04-18 DIAGNOSIS — R102 Pelvic and perineal pain: Secondary | ICD-10-CM | POA: Insufficient documentation

## 2015-04-19 MED ORDER — TIZANIDINE HCL 2 MG PO CAPS: 2.0000 mg | ORAL_CAPSULE | Freq: Two times a day (BID) | ORAL | Status: DC | PRN

## 2015-04-19 NOTE — Telephone Encounter (Signed)
Noted  

## 2015-04-19 NOTE — Telephone Encounter (Signed)
Tizanidine 2mg  BID prn, Rx sent.  Beth Melder K. Posey Pronto, DO

## 2015-04-19 NOTE — Telephone Encounter (Signed)
Patient is ok with having to switch to tizanidine.  Which strength and how often to take?

## 2015-04-26 ENCOUNTER — Other Ambulatory Visit: Payer: Self-pay | Admitting: Internal Medicine

## 2015-04-27 NOTE — Telephone Encounter (Signed)
Sent in as prn dose w one refill

## 2015-05-16 ENCOUNTER — Other Ambulatory Visit: Payer: 59

## 2015-05-18 ENCOUNTER — Other Ambulatory Visit: Payer: Self-pay | Admitting: Family Medicine

## 2015-05-18 MED ORDER — OMEPRAZOLE 20 MG PO CPDR: 20.0000 mg | DELAYED_RELEASE_CAPSULE | Freq: Every day | ORAL | Status: DC

## 2015-05-18 NOTE — Telephone Encounter (Signed)
Received fax for 90 day refill request.  Sent to the pharmacy by e-scribe.

## 2015-05-19 ENCOUNTER — Other Ambulatory Visit: Payer: Self-pay | Admitting: Family Medicine

## 2015-05-19 DIAGNOSIS — E876 Hypokalemia: Secondary | ICD-10-CM

## 2015-05-20 ENCOUNTER — Other Ambulatory Visit (INDEPENDENT_AMBULATORY_CARE_PROVIDER_SITE_OTHER): Payer: 59

## 2015-05-20 ENCOUNTER — Ambulatory Visit (INDEPENDENT_AMBULATORY_CARE_PROVIDER_SITE_OTHER): Payer: 59 | Admitting: Urology

## 2015-05-20 DIAGNOSIS — R102 Pelvic and perineal pain: Secondary | ICD-10-CM

## 2015-05-20 DIAGNOSIS — E876 Hypokalemia: Secondary | ICD-10-CM

## 2015-05-20 DIAGNOSIS — R3129 Other microscopic hematuria: Secondary | ICD-10-CM

## 2015-05-20 DIAGNOSIS — R300 Dysuria: Secondary | ICD-10-CM

## 2015-05-20 LAB — BASIC METABOLIC PANEL
BUN: 15 mg/dL (ref 6–23)
CHLORIDE: 104 meq/L (ref 96–112)
CO2: 29 mEq/L (ref 19–32)
Calcium: 9.5 mg/dL (ref 8.4–10.5)
Creatinine, Ser: 0.77 mg/dL (ref 0.40–1.20)
GFR: 102.97 mL/min (ref >60.00)
GLUCOSE: 103 mg/dL — AB (ref 70–99)
POTASSIUM: 4.5 meq/L (ref 3.5–5.1)
SODIUM: 140 meq/L (ref 135–145)

## 2015-05-20 LAB — MAGNESIUM: MAGNESIUM: 2.1 mg/dL (ref 1.5–2.5)

## 2015-06-07 ENCOUNTER — Other Ambulatory Visit: Payer: Self-pay | Admitting: Internal Medicine

## 2015-06-07 NOTE — Telephone Encounter (Signed)
Ok x 1

## 2015-09-07 ENCOUNTER — Other Ambulatory Visit: Payer: Self-pay | Admitting: Internal Medicine

## 2015-09-08 NOTE — Telephone Encounter (Signed)
Ok to refill both x 1  

## 2015-09-09 NOTE — Telephone Encounter (Signed)
Called to the pharmacy and left on machine. 

## 2015-10-19 ENCOUNTER — Ambulatory Visit: Payer: 59 | Admitting: Neurology

## 2015-12-05 ENCOUNTER — Other Ambulatory Visit: Payer: Self-pay | Admitting: Internal Medicine

## 2015-12-06 NOTE — Telephone Encounter (Signed)
Sent to the pharmacy by e-scribe for 6 months.  Pt has upcoming cpx on 04/20/16.  CPX on 04/15/15 and asked to return in 1 year.

## 2016-02-03 ENCOUNTER — Ambulatory Visit: Payer: 59 | Admitting: Neurology

## 2016-02-29 ENCOUNTER — Encounter: Payer: Self-pay | Admitting: Neurology

## 2016-02-29 ENCOUNTER — Ambulatory Visit (INDEPENDENT_AMBULATORY_CARE_PROVIDER_SITE_OTHER): Payer: 59 | Admitting: Neurology

## 2016-02-29 VITALS — BP 132/84 | HR 78 | Wt 272.0 lb

## 2016-02-29 DIAGNOSIS — R51 Headache: Secondary | ICD-10-CM | POA: Diagnosis not present

## 2016-02-29 DIAGNOSIS — G4486 Cervicogenic headache: Secondary | ICD-10-CM

## 2016-02-29 NOTE — Patient Instructions (Addendum)
1.  Start tizanidine 2mg  at bedtime for cervicalgia and neck spasm 2.  Start neck physiotherapy - [facility] in [place] 3.  Start yoga for neck stretching  Return to clinic in 6 months

## 2016-02-29 NOTE — Progress Notes (Signed)
Follow-up Visit   Date: 02/29/16    Beth Wallace MRN: [id number] DOB: [birth year]   Interim History: Beth Wallace is a 51 y.o. right-handed African American female with hypertension, seasonal allergies, and GERD returning to the clinic for follow-up of cervicogenic headaches.  The patient was accompanied to the clinic by self.  History of present illness: Starting around January 2016, she started developing pressure over the vertex of the head and ocassionally over the forehead. Pain is improved by walking and resting. It is worse when she is working and lasts several hours. It can occur daily but she usually does not notice the discomfort when she is not working. No photophobia, phonophobia, nasuea, vomiting. Pain is ranked as 4-5/10. It does not limit her daily functioning, but is annoying. She has tried excedrin migraine, aleve, and tylenol none which provided significant relief. She was given prednisone injection but this did not help either.   She went to her allergist and initially thought it was due allergies. She then went to the emergency department and told to follow-up with ENT. She was told she had a enlarged turbinate and deviated septum. She works on her computer for 12-14 days and was told that headaches may simply be tension.   She previously had migraine when pregnant which would be associated with photophobia and phonophobia.   She is a delightful lady who unfortunately has lost her 59 year old daughter and younger sister in a triple homicide. She has a tremendous strength and integrity of character and has managed to deal with such tragedy in an effective manner.   UPDATE 04/15/2015:  She has been doing neck physiotherapy and reports noticing at least 50% improvement in her head pressure.  She has been going twice per week.  She reports having neck and head pain once every two weeks, which is relieved with ibuprofen. She stopped taking flexeril due to  increased sedation and lingering cognitive side effects.   She denies any new neurological complaints.  She has started weight watchers and lost 17lb since early 2017!  UPDATE 02/29/2016:  She responded well with neck PT and her headaches and neck pain was improved for sometime.  Starting in late January 2018, she began having neck pain, described as soreness, which sometimes also involves her arms. There is no numbness/tingling or weakness.  She feels that it may be related to how much time she spends on her computer and poor posture so is having an adjustable desk placed at home.  She tried naproxen which helps.  She also saw a chiropractor and felt that this has improved her range of motion.  No new headaches or vision changes.     Medications:  Current Outpatient Prescriptions on File Prior to Visit  Medication Sig Dispense Refill  . ALPRAZolam (XANAX) 0.25 MG tablet TAKE 1 TABLET BY MOUTH 3 TIMES A DAY AS NEEDED FOR ANXIETY 24 tablet 0  . ALPRAZolam (XANAX) 0.5 MG tablet TAKE 1 TABLET BY MOUTH 3 TIMES A DAY AS NEEDED FOR ANXIETY 24 tablet 0  . diazepam (VALIUM) 5 MG tablet Take 5 mg by mouth as needed.  0  . ibuprofen (ADVIL,MOTRIN) 800 MG tablet TAKE 1 TABLET BY MOUTH EVERY 8 HOURS AS NEEDED FOR CRAMPS 30 tablet 0  . lisinopril-hydrochlorothiazide (PRINZIDE,ZESTORETIC) 20-12.5 MG tablet Take 1 tablet by mouth daily. 90 tablet 3  . nitrofurantoin (MACRODANTIN) 50 MG capsule     . omeprazole (PRILOSEC) 20 MG capsule TAKE ONE CAPSULE BY MOUTH EVERY DAY  90 capsule 1  . potassium chloride SA (K-DUR,KLOR-CON) 20 MEQ tablet Take 1 tablet (20 mEq total) by mouth once. 30 tablet 5  . tizanidine (ZANAFLEX) 2 MG capsule Take 1 capsule (2 mg total) by mouth 2 (two) times daily as needed for muscle spasms. 45 capsule 3  . metroNIDAZOLE (FLAGYL) 500 MG tablet Take 500 mg by mouth 2 (two) times daily.  0   No current facility-administered medications on file prior to visit.     Allergies:  Allergies    Allergen Reactions  . Bee Venom Shortness Of Breath  . Contrast Media [Iodinated Diagnostic Agents] Anaphylaxis    CT dye ivp Throat swelling    Review of Systems:  CONSTITUTIONAL: No fevers, chills, night sweats, or weight loss.  EYES: No visual changes or eye pain ENT: No hearing changes.  No history of nose bleeds.   RESPIRATORY: No cough, wheezing and shortness of breath.   CARDIOVASCULAR: Negative for chest pain, and palpitations.   GI: Negative for abdominal discomfort, blood in stools or black stools.  No recent change in bowel habits.   GU:  No history of incontinence.   MUSCLOSKELETAL: No history of joint pain or swelling.  No myalgias.   SKIN: Negative for lesions, rash, and itching.   ENDOCRINE: Negative for cold or heat intolerance, polydipsia or goiter.   PSYCH:  No depression or anxiety symptoms.   NEURO: As Above.   Vital Signs:  BP 132/84   Pulse 78   Wt 272 lb (123.4 kg)   SpO2 99%   BMI 41.36 kg/m  Pain Scale: 0 on a scale of 0-10  Neurological Exam: MENTAL STATUS including orientation to time, place, person, recent and remote memory, attention span and concentration, language, and fund of knowledge is normal.  Speech is not dysarthric.  CRANIAL NERVES: Pupils equal round and reactive to light.  Normal conjugate, extra-ocular eye movements in all directions of gaze.  No ptosis. Normal facial sensation.  Face is symmetric. Palate elevates symmetrically.  Tongue is midline.  MOTOR:  Motor strength is 5/5 in all extremities.  She continues to have cervical muscle tension and reduced neck ROM with rotation and side bending.   REFLEXES:  Normal 2+/4 throughout.    COORDINATION/GAIT:   Gait narrow based and stable.   Data: XR cervical spine 12/30/2013: Mild spondylosis at C5-6. No acute osseous abnormality.  IMPRESSION: 1.  Cervicogenic headaches, worse.  Start tizanidine 2mg  at bedtime for cervicalgia and neck spasm  Start neck physiotherapy - [facility]  in [place]  Encouraged to start yoga exercises for neck stretching  2.  Morbid obesity, she has been actively trying to loose weight, which I encouraged her to continue   Return to clinic in 6 months  The duration of this appointment visit was 25 minutes of face-to-face time with the patient.  Greater than 50% of this time was spent in counseling, explanation of diagnosis, planning of further management, and coordination of care.   Thank you for allowing me to participate in patient's care.  If I can answer any additional questions, I would be pleased to do so.    Sincerely,    Eufelia Veno K. Posey Pronto, DO

## 2016-04-09 ENCOUNTER — Other Ambulatory Visit: Payer: Self-pay | Admitting: Family Medicine

## 2016-04-09 DIAGNOSIS — I10 Essential (primary) hypertension: Secondary | ICD-10-CM

## 2016-04-09 DIAGNOSIS — R5383 Other fatigue: Secondary | ICD-10-CM

## 2016-04-09 DIAGNOSIS — E786 Lipoprotein deficiency: Secondary | ICD-10-CM

## 2016-04-09 DIAGNOSIS — R739 Hyperglycemia, unspecified: Secondary | ICD-10-CM

## 2016-04-09 DIAGNOSIS — E876 Hypokalemia: Secondary | ICD-10-CM

## 2016-04-12 ENCOUNTER — Other Ambulatory Visit (INDEPENDENT_AMBULATORY_CARE_PROVIDER_SITE_OTHER): Payer: 59

## 2016-04-12 DIAGNOSIS — E876 Hypokalemia: Secondary | ICD-10-CM | POA: Diagnosis not present

## 2016-04-12 DIAGNOSIS — R739 Hyperglycemia, unspecified: Secondary | ICD-10-CM | POA: Diagnosis not present

## 2016-04-12 DIAGNOSIS — E786 Lipoprotein deficiency: Secondary | ICD-10-CM | POA: Diagnosis not present

## 2016-04-12 DIAGNOSIS — R5383 Other fatigue: Secondary | ICD-10-CM

## 2016-04-12 DIAGNOSIS — I10 Essential (primary) hypertension: Secondary | ICD-10-CM

## 2016-04-12 LAB — HEPATIC FUNCTION PANEL
ALBUMIN: 4.5 g/dL (ref 3.5–5.2)
ALK PHOS: 77 U/L (ref 39–117)
ALT: 23 U/L (ref 0–35)
AST: 19 U/L (ref 0–37)
Bilirubin, Direct: 0.1 mg/dL (ref 0.0–0.3)
TOTAL PROTEIN: 7.5 g/dL (ref 6.0–8.3)
Total Bilirubin: 0.4 mg/dL (ref 0.2–1.2)

## 2016-04-12 LAB — TSH: TSH: 1.09 u[IU]/mL (ref 0.35–4.50)

## 2016-04-12 LAB — HEMOGLOBIN A1C: Hgb A1c MFr Bld: 6.2 % (ref 4.6–6.5)

## 2016-04-12 LAB — CBC WITH DIFFERENTIAL/PLATELET
BASOS ABS: 0 10*3/uL (ref 0.0–0.1)
Basophils Relative: 0.7 % (ref 0.0–3.0)
EOS ABS: 0.1 10*3/uL (ref 0.0–0.7)
Eosinophils Relative: 1.7 % (ref 0.0–5.0)
HCT: 42.2 % (ref 36.0–46.0)
HEMOGLOBIN: 14.1 g/dL (ref 12.0–15.0)
Lymphocytes Relative: 39.7 % (ref 12.0–46.0)
Lymphs Abs: 2.1 10*3/uL (ref 0.7–4.0)
MCHC: 33.5 g/dL (ref 30.0–36.0)
MCV: 89.2 fl (ref 78.0–100.0)
MONO ABS: 0.4 10*3/uL (ref 0.1–1.0)
Monocytes Relative: 7.7 % (ref 3.0–12.0)
NEUTROS PCT: 50.2 % (ref 43.0–77.0)
Neutro Abs: 2.6 10*3/uL (ref 1.4–7.7)
Platelets: 309 10*3/uL (ref 150.0–400.0)
RBC: 4.72 Mil/uL (ref 3.87–5.11)
RDW: 14.5 % (ref 11.5–15.5)
WBC: 5.3 10*3/uL (ref 4.0–10.5)

## 2016-04-12 LAB — LIPID PANEL
Cholesterol: 177 mg/dL (ref 0–200)
HDL: 37.2 mg/dL — AB (ref >39.00)
LDL Cholesterol: 119 mg/dL — ABNORMAL HIGH (ref 0–99)
NONHDL: 139.76
Total CHOL/HDL Ratio: 5
Triglycerides: 103 mg/dL (ref 0.0–149.0)
VLDL: 20.6 mg/dL (ref 0.0–40.0)

## 2016-04-12 LAB — BASIC METABOLIC PANEL
BUN: 15 mg/dL (ref 6–23)
CALCIUM: 9.5 mg/dL (ref 8.4–10.5)
CO2: 33 mEq/L — ABNORMAL HIGH (ref 19–32)
CREATININE: 0.79 mg/dL (ref 0.40–1.20)
Chloride: 101 mEq/L (ref 96–112)
GFR: 99.59 mL/min (ref >60.00)
Glucose, Bld: 104 mg/dL — ABNORMAL HIGH (ref 70–99)
POTASSIUM: 4.6 meq/L (ref 3.5–5.1)
Sodium: 140 mEq/L (ref 135–145)

## 2016-04-12 LAB — MAGNESIUM: MAGNESIUM: 2 mg/dL (ref 1.5–2.5)

## 2016-04-13 ENCOUNTER — Other Ambulatory Visit: Payer: 59

## 2016-04-19 NOTE — Progress Notes (Signed)
Chief Complaint  Patient presents with  . Annual Exam    HPI: Patient  Beth Wallace  51 y.o. comes in today for Preventive Health Care visit  Dr patel for cervicogenic HA Prn valium for pelvic floot dysfunction.  alprazalam for flying  Has old 62 left   Had lost weight weight watchres 37 # and then stopped mionitoring and gain some back but going back    asksa about  Pills that friend in on   Smoking some socially   2-3 interested in cessation   help   Health Maintenance  Topic Date Due  . HIV Screening  04/20/2017 (Originally [birth year]1982)  . INFLUENZA VACCINE  08/15/2016  . PAP SMEAR  09/08/2016  . MAMMOGRAM  12/15/2016  . TETANUS/TDAP  05/16/2019   Health Maintenance Review LIFESTYLE:  Exercise:  some Tobacco/ETS:back to 2-3 per day  Alcohol: n Sugar beverages:n Sleep:6 hours  Drug use: no HH of 2 [place] Work:40 - 50    ROS:  GEN/ HEENT: No fever, significant weight changes sweats headaches vision problems hearing changes, CV/ PULM; No chest pain shortness of breath cough, syncope,edema  change in exercise tolerance. GI /GU: No adominal pain, vomiting, change in bowel habits. No blood in the stool. No significant GU symptoms. SKIN/HEME: ,no acute skin rashes suspicious lesions or bleeding. No lymphadenopathy, nodules, masses.  NEURO/ PSYCH:  No neurologic signs such as weakness numbness. No depression anxiety. IMM/ Allergy: No unusual infections.  Allergy .   REST of 12 system review negative except as per HPI   Past Medical History:  Diagnosis Date  . ADJ DISORDER WITH MIXED ANXIETY & DEPRESSED MOOD 12/14/2009   Qualifier: Diagnosis of  By: Regis Bill MD, Standley Brooking   . Allergic reaction 05/18/2011  . Anxiety   . GERD (gastroesophageal reflux disease)   . History of gallstones   . HTN (hypertension)   . Metrorrhagia 12/14/2009   Qualifier: Diagnosis of  By: Regis Bill MD, Standley Brooking With pelvic pain  Sees Dr Philis Pique and tobotic hysterectomy scheduled   . Obesity     . TMJ (temporomandibular joint disorder)     Past Surgical History:  Procedure Laterality Date  . ABDOMINAL HYSTERECTOMY  09/2010   partial  . CESAREAN SECTION    . CHOLECYSTECTOMY  1989  . WISDOM TOOTH EXTRACTION  2010    Family History  Problem Relation Age of Onset  . Heart disease Mother 79    Died age 46, MI  . Heart attack Mother   . Hypertension Mother   . Hypertension Father     Living  . Diabetes Father   . Heart disease Father   . Other Sister     Killed, age 34  . Other Daughter     Killed, age 76  . Hyperlipidemia Neg Hx   . Sudden death Neg Hx     Social History   Social History  . Marital status: Single    Spouse name: N/A  . Number of children: 1  . Years of education: N/A   Occupational History  . manager [employer]   Social History Main Topics  . Smoking status: Current Some Day Smoker    Packs/day: 0.25    Types: Cigarettes    Last attempt to quit: 01/22/2012  . Smokeless tobacco: Never Used     Comment: 1-2 cigarretes daily  . Alcohol use No  . Drug use: No  . Sexual activity: Not Asked   Other Topics Concern  .  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 ALPRAZolam (XANAX) 0.5 MG tablet TAKE 1 TABLET BY MOUTH 3 TIMES A DAY AS NEEDED FOR ANXIETY 24 tablet 0  . diazepam (VALIUM) 5 MG tablet Take 5 mg by mouth as needed.  0  . lisinopril-hydrochlorothiazide (PRINZIDE,ZESTORETIC) 20-12.5 MG tablet Take 1  tablet by mouth daily. 90 tablet 3  . nitrofurantoin (MACRODANTIN) 50 MG capsule     . omeprazole (PRILOSEC) 20 MG capsule TAKE ONE CAPSULE BY MOUTH EVERY DAY 90 capsule 1  . tizanidine (ZANAFLEX) 2 MG capsule Take 1 capsule (2 mg total) by mouth 2 (two) times daily as needed for muscle spasms. 45 capsule 3  . ibuprofen (ADVIL,MOTRIN) 800 MG tablet TAKE 1 TABLET BY MOUTH EVERY 8 HOURS AS NEEDED FOR CRAMPS 30 tablet 0  . potassium chloride SA (K-DUR,KLOR-CON) 20 MEQ tablet Take 1 tablet (20 mEq total) by mouth once. 30 tablet 5  . ALPRAZolam (XANAX) 0.25 MG tablet TAKE 1 TABLET BY MOUTH 3 TIMES A DAY AS NEEDED FOR ANXIETY 24 tablet 0  . metroNIDAZOLE (FLAGYL) 500 MG tablet Take 500 mg by mouth 2 (two) times daily.  0   No facility-administered medications prior to visit.      EXAM:  BP 130/84 (BP Location: Right Arm, Patient Position: Sitting, Cuff Size: Large)   Pulse 65   Temp 97.9 F (36.6 C) (Oral)   Ht 5\' 7"  (1.702 m)   Wt 285 lb 12.8 oz (129.6 kg)   BMI 44.76 kg/m   Body mass index is 44.76 kg/m. Wt Readings from Last 3 Encounters:  04/20/16 285 lb 12.8 oz (129.6 kg)  02/29/16 272 lb (123.4 kg)  04/15/15 277 lb 8 oz (125.9 kg)    Physical Exam: Vital signs reviewed JSR:PRXY is a well-developed well-nourished alert cooperative    who appearsr stated age in no acute distress.   HEENT: normocephalic atraumatic , Eyes: PERRL EOM's full, conjunctiva clear, Nares: paten,t no deformity discharge or tenderness., Ears: no deformity EAC's clear TMs with normal landmarks. Mouth: clear OP, no lesions, edema.  Moist mucous membranes. Dentition in adequate repair. NECK: supple without masses, thyromegaly or bruits. CHEST/PULM:  Clear to auscultation and percussion breath sounds equal no wheeze , rales or rhonchi. No chest wall deformities or tenderness. Breast: normal by inspection . No dimpling, discharge, masses, tenderness or discharge . CV: PMI is nondisplaced, S1 S2 no gallops,  murmurs, rubs. Peripheral pulses are full without delay.No JVD .  ABDOMEN: Bowel sounds normal nontender  No guard or rebound, no hepato splenomegal no CVA tenderness.  No hernia. Extremtities:  No clubbing cyanosis or edema, no acute joint swelling or redness no focal atrophy r achilles thickened  NEURO:  Oriented x3, cranial nerves 3-12 appear to be intact, no obvious focal weakness,gait within normal limits no abnormal reflexes or asymmetrical SKIN: No acute rashes normal turgor, color, no bruising or petechiae. PSYCH: Oriented, good eye contact, no obvious depression anxiety, cognition and judgment appear normal. LN: no cervical axillary inguinal adenopathy  Lab Results  Component Value Date   WBC 5.3 04/12/2016   HGB 14.1 04/12/2016   HCT 42.2 04/12/2016   PLT 309.0 04/12/2016   GLUCOSE 104 (H) 04/12/2016   CHOL 177 04/12/2016   TRIG 103.0 04/12/2016   HDL 37.20 (L) 04/12/2016   LDLCALC 119 (H) 04/12/2016   ALT 23 04/12/2016   AST 19 04/12/2016   NA 140 04/12/2016   K 4.6 04/12/2016   CL 101 04/12/2016   CREATININE 0.79 04/12/2016   BUN 15 04/12/2016   CO2 33 (H) 04/12/2016   TSH 1.09 04/12/2016   HGBA1C 6.2 04/12/2016   MICROALBUR <0.7 11/19/2014    BP Readings from Last 3 Encounters:  04/20/16 130/84  02/29/16 132/84  04/15/15 118/72    Lab results reviewed with patient   ASSESSMENT AND PLAN:  Discussed the following assessment and plan:  Visit for preventive health examination  Low HDL (under 40)  Essential hypertension  Medication management  Needs flu shot - Plan: Flu Vaccine QUAD 36+ mos IM  BMI 40.0-44.9, adult ([place])  Tobacco use - early relapse   Fasting hyperglycemia No longer taking potassium  Refill ibu with caution for cramps  Cation with 2 benzos if rxed   Uses alpraz for flying  bp controlled  We'll keep the bupropion trial for smoking cessation. Discussed strategies back to Weight Watchers and back on track and follow-up in 3-4 months  consideration of adding medication as appropriate at that time in regard to her weight loss.  To avoid trigger food  Patient Care Team: Burnis Medin, MD as PCP - General Ladene Artist, MD as Attending Physician (Gastroenterology) Patient Instructions   Get back on track with . Healthy eating and weight loss.   Can try wellbutrin  for tobacco cessation.    Consider other aids after implementation.   ROV in 3-4 months or as needed .        Standley Brooking. Panosh M.D.

## 2016-04-20 ENCOUNTER — Ambulatory Visit (INDEPENDENT_AMBULATORY_CARE_PROVIDER_SITE_OTHER): Payer: 59 | Admitting: Internal Medicine

## 2016-04-20 ENCOUNTER — Encounter: Payer: Self-pay | Admitting: Internal Medicine

## 2016-04-20 VITALS — BP 130/84 | HR 65 | Temp 97.9°F | Ht 67.0 in | Wt 285.8 lb

## 2016-04-20 DIAGNOSIS — Z0000 Encounter for general adult medical examination without abnormal findings: Secondary | ICD-10-CM

## 2016-04-20 DIAGNOSIS — R7301 Impaired fasting glucose: Secondary | ICD-10-CM | POA: Diagnosis not present

## 2016-04-20 DIAGNOSIS — E786 Lipoprotein deficiency: Secondary | ICD-10-CM

## 2016-04-20 DIAGNOSIS — Z79899 Other long term (current) drug therapy: Secondary | ICD-10-CM | POA: Diagnosis not present

## 2016-04-20 DIAGNOSIS — Z6841 Body Mass Index (BMI) 40.0 and over, adult: Secondary | ICD-10-CM

## 2016-04-20 DIAGNOSIS — I10 Essential (primary) hypertension: Secondary | ICD-10-CM | POA: Diagnosis not present

## 2016-04-20 DIAGNOSIS — Z23 Encounter for immunization: Secondary | ICD-10-CM | POA: Diagnosis not present

## 2016-04-20 DIAGNOSIS — Z720 Tobacco use: Secondary | ICD-10-CM | POA: Diagnosis not present

## 2016-04-20 MED ORDER — IBUPROFEN 800 MG PO TABS: ORAL_TABLET | ORAL | 2 refills | Status: DC

## 2016-04-20 MED ORDER — BUPROPION HCL ER (XL) 150 MG PO TB24: 150.0000 mg | ORAL_TABLET | Freq: Every day | ORAL | 4 refills | Status: DC

## 2016-04-20 NOTE — Patient Instructions (Addendum)
  Get back on track with . Healthy eating and weight loss.   Can try wellbutrin  for tobacco cessation.    Consider other aids after implementation.   ROV in 3-4 months or as needed .

## 2016-05-28 ENCOUNTER — Other Ambulatory Visit: Payer: Self-pay | Admitting: Internal Medicine

## 2016-06-08 ENCOUNTER — Other Ambulatory Visit: Payer: Self-pay | Admitting: Internal Medicine

## 2016-07-17 ENCOUNTER — Encounter: Payer: Self-pay | Admitting: Neurology

## 2016-07-17 ENCOUNTER — Other Ambulatory Visit: Payer: Self-pay | Admitting: *Deleted

## 2016-07-17 DIAGNOSIS — R51 Headache: Principal | ICD-10-CM

## 2016-07-17 DIAGNOSIS — G4486 Cervicogenic headache: Secondary | ICD-10-CM

## 2016-08-17 NOTE — Progress Notes (Signed)
Chief Complaint  Patient presents with  . Follow-up    HPI: Beth Wallace 51 y.o. come in for Chronic disease management   Hot flushes  Was doing well.   Doing weight watchres but  Has issues with   Mid  And late day of day .  Snacking.  Works from home   Problematic but doing well many days   Tobacco    :  Still smoke a bit  Pack  1.5 weeks  Stopped taking. wellbutrin   124/64 Having neck HA flaring after trip to [place]  Should she keep her fu with neuro?  ROS: See pertinent positives and negatives per HPI.  Past Medical History:  Diagnosis Date  . ADJ DISORDER WITH MIXED ANXIETY & DEPRESSED MOOD 12/14/2009   Qualifier: Diagnosis of  By: Regis Bill MD, Standley Brooking   . Allergic reaction 05/18/2011  . Anxiety   . GERD (gastroesophageal reflux disease)   . History of gallstones   . HTN (hypertension)   . Metrorrhagia 12/14/2009   Qualifier: Diagnosis of  By: Regis Bill MD, Standley Brooking With pelvic pain  Sees Dr Philis Pique and tobotic hysterectomy scheduled   . Obesity   . TMJ (temporomandibular joint disorder)     Family History  Problem Relation Age of Onset  . Heart disease Mother 45       Died age 49, MI  . Heart attack Mother   . Hypertension Mother   . Hypertension Father        Living  . Diabetes Father   . Heart disease Father   . Other Sister        Killed, age 3  . Other Daughter        Killed, age 35  . Hyperlipidemia Neg Hx   . Sudden death Neg Hx     Social History   Social History  . Marital status: Single    Spouse name: N/A  . Number of children: 1  . Years of education: N/A   Occupational History  . manager [employer]   Social History Main Topics  . Smoking status: Current Some Day Smoker    Packs/day: 0.25    Types: Cigarettes    Last attempt to quit: 01/22/2012  . Smokeless tobacco: Never Used     Comment: 1-2 cigarretes daily  . Alcohol use No  . Drug use: No  . Sexual activity: Not Asked   Other Topics Concern  . 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 ALPRAZolam (XANAX) 0.5 MG tablet TAKE 1 TABLET BY MOUTH 3 TIMES A  DAY AS NEEDED FOR ANXIETY 24 tablet 0  . buPROPion (WELLBUTRIN XL) 150 MG 24 hr tablet Take 1 tablet (150 mg total) by mouth daily. Increase to 2 po qd after one week 60 tablet 4  . diazepam (VALIUM) 5 MG tablet Take 5 mg by mouth as needed.  0  . ibuprofen (ADVIL,MOTRIN) 800 MG tablet TAKE 1 TABLET BY MOUTH EVERY 8 HOURS AS NEEDED FOR CRAMPS 30 tablet 2  . lisinopril-hydrochlorothiazide (PRINZIDE,ZESTORETIC) 20-12.5 MG tablet TAKE 1 TABLET BY MOUTH DAILY. 90 tablet 0  . nitrofurantoin (MACRODANTIN) 50 MG capsule     . omeprazole (PRILOSEC) 20 MG capsule TAKE ONE CAPSULE BY MOUTH EVERY DAY 90 capsule 0  . tizanidine (ZANAFLEX) 2 MG capsule Take 1 capsule (2 mg total) by mouth 2 (two) times daily as needed for muscle spasms. 45 capsule 3   No facility-administered medications prior to visit.      EXAM:  BP 138/82 (BP Location: Right Arm, Patient Position: Sitting, Cuff Size: Large)   Pulse 70   Temp 98 F (36.7 C) (Oral)   Wt 289 lb (131.1 kg) Comment: per patient weight watchers  BMI 45.26 kg/m   Body mass index is 45.26 kg/m.  GENERAL: vitals reviewed and listed above, alert, oriented, appears well hydrated and in no acute distress HEENT: atraumatic, conjunctiva  clear, no obvious abnormalities on inspection of external nose and ears  Neck stiffness  Co PSYCH: pleasant  and cooperative, no obvious depression or anxiety Lab Results  Component Value Date   WBC 5.3 04/12/2016   HGB 14.1 04/12/2016   HCT 42.2 04/12/2016   PLT 309.0 04/12/2016   GLUCOSE 104 (H) 04/12/2016   CHOL 177 04/12/2016   TRIG 103.0 04/12/2016   HDL 37.20 (L) 04/12/2016   LDLCALC 119 (H) 04/12/2016   ALT 23 04/12/2016   AST 19 04/12/2016   NA 140 04/12/2016   K 4.6 04/12/2016   CL 101 04/12/2016   CREATININE 0.79 04/12/2016   BUN 15 04/12/2016   CO2 33 (H) 04/12/2016   TSH 1.09 04/12/2016   HGBA1C 6.2 04/12/2016   MICROALBUR <0.7 11/19/2014   BP Readings from Last 3 Encounters:  08/20/16 138/82  04/20/16 130/84  02/29/16 132/84   Wt Readings from Last 3 Encounters:  08/20/16 289 lb (131.1 kg)  04/20/16 285 lb 12.8 oz (129.6 kg)  02/29/16 272 lb (123.4 kg)     ASSESSMENT AND PLAN:  Discussed the following assessment and plan:  Morbid obesity, unspecified obesity type ([place]) - 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  has adjsuted work station  Hyperglycemia  Medication management  Tobacco use - decrease off wellbutrin advise stop could inc carb cravings  BMI 40.0-44.9, adult ([place]) disc  Ongoing strategies   Acceptable candidate for  Med intervention  At this time phentermine   Add on focus on dec  Carb snack etc ,.    Benefit more than risk of medications   Fu  1 months or thera bouts  Can consider inc dose  As appropriate .   Keep neuro appt  Consider ation of  topomax prep  Consideration.   Total visit 55mins > 50% spent counseling and coordinating care as indicated in above note and in instructions to patient .  -Patient advised to return or notify  health care team  if  new concerns arise.  Patient Instructions   Continue to work on [employer] behavior  .   As we discussed .    And add  appettie  .    Suppressant  Fu in about a month. Or as needed   bp goal is now 120/80  Wt Readings from Last 3 Encounters:  08/20/16 298 lb (135.2 kg)  04/20/16 285 lb 12.8 oz (129.6 kg)  02/29/16 272 lb (123.4 kg)   Your weight is going  Up   By our  Records  BP Readings from Last 3 Encounters:  08/20/16 138/82  04/20/16 130/84  02/29/16 132/84       Kady Toothaker K. Renalda Locklin M.D.

## 2016-08-20 ENCOUNTER — Encounter: Payer: Self-pay | Admitting: Internal Medicine

## 2016-08-20 ENCOUNTER — Ambulatory Visit (INDEPENDENT_AMBULATORY_CARE_PROVIDER_SITE_OTHER): Payer: 59 | Admitting: Internal Medicine

## 2016-08-20 DIAGNOSIS — Z720 Tobacco use: Secondary | ICD-10-CM

## 2016-08-20 DIAGNOSIS — Z79899 Other long term (current) drug therapy: Secondary | ICD-10-CM

## 2016-08-20 DIAGNOSIS — R51 Headache: Secondary | ICD-10-CM

## 2016-08-20 DIAGNOSIS — Z6841 Body Mass Index (BMI) 40.0 and over, adult: Secondary | ICD-10-CM

## 2016-08-20 DIAGNOSIS — G4486 Cervicogenic headache: Secondary | ICD-10-CM

## 2016-08-20 DIAGNOSIS — R739 Hyperglycemia, unspecified: Secondary | ICD-10-CM | POA: Diagnosis not present

## 2016-08-20 DIAGNOSIS — I10 Essential (primary) hypertension: Secondary | ICD-10-CM | POA: Diagnosis not present

## 2016-08-20 MED ORDER — PHENTERMINE HCL 15 MG PO CAPS: 15.0000 mg | ORAL_CAPSULE | ORAL | 1 refills | Status: DC

## 2016-08-20 NOTE — Patient Instructions (Addendum)
Continue to work on the  [employer]  .   As we discussed .   And add  appettie  .    Suppressant  Fu in about a month. Or as needed   bp goal is now 120/80  Wt Readings from Last 3 Encounters:  08/20/16 298 lb (135.2 kg)  04/20/16 285 lb 12.8 oz (129.6 kg)  02/29/16 272 lb (123.4 kg)   Your weight is going  Up   By our  Records  BP Readings from Last 3 Encounters:  08/20/16 138/82  04/20/16 130/84  02/29/16 132/84

## 2016-08-29 ENCOUNTER — Encounter: Payer: Self-pay | Admitting: Neurology

## 2016-08-29 ENCOUNTER — Ambulatory Visit (INDEPENDENT_AMBULATORY_CARE_PROVIDER_SITE_OTHER): Payer: 59 | Admitting: Neurology

## 2016-08-29 VITALS — BP 120/80 | HR 75 | Ht 67.0 in | Wt 297.2 lb

## 2016-08-29 DIAGNOSIS — R51 Headache: Secondary | ICD-10-CM | POA: Diagnosis not present

## 2016-08-29 DIAGNOSIS — G4486 Cervicogenic headache: Secondary | ICD-10-CM

## 2016-08-29 DIAGNOSIS — G44221 Chronic tension-type headache, intractable: Secondary | ICD-10-CM

## 2016-08-29 MED ORDER — METHOCARBAMOL 500 MG PO TABS: 500.0000 mg | ORAL_TABLET | Freq: Two times a day (BID) | ORAL | 5 refills | Status: DC | PRN

## 2016-08-29 MED ORDER — KETOROLAC TROMETHAMINE 60 MG/2ML IM SOLN
30.0000 mg | Freq: Once | INTRAMUSCULAR | Status: AC
  Administered 2016-08-29: 30 mg via INTRAMUSCULAR

## 2016-08-29 NOTE — Progress Notes (Signed)
Follow-up Visit   Date: 08/29/16    Beth Wallace MRN: [id number] DOB: Nov 20, 1965   Interim History: Beth Wallace is a 51 y.o. right-handed African American female with hypertension, seasonal allergies, and GERD returning to the clinic for follow-up of cervicogenic headaches.  The patient was accompanied to the clinic by self.  History of present illness: Starting around January 2016, she started developing pressure over the vertex of the head and ocassionally over the forehead. Pain is improved by walking and resting. It is worse when she is working and lasts several hours. It can occur daily but she usually does not notice the discomfort when she is not working. No photophobia, phonophobia, nasuea, vomiting. Pain is ranked as 4-5/10. It does not limit her daily functioning, but is annoying. She has tried excedrin migraine, aleve, and tylenol none which provided significant relief. She was given prednisone injection but this did not help either.   She went to her allergist and initially thought it was due allergies. She then went to the emergency department and told to follow-up with ENT. She was told she had a enlarged turbinate and deviated septum. She works on her computer for 12-14 days and was told that headaches may simply be tension.   She previously had migraine when pregnant which would be associated with photophobia and phonophobia.   She is a delightful lady who unfortunately has lost her 71 year old daughter and younger sister in a triple homicide. She has a tremendous strength and integrity of character and has managed to deal with such tragedy in an effective manner.   UPDATE 04/15/2015:  She has been doing neck physiotherapy and reports noticing at least 50% improvement in her head pressure.  She has been going twice per week.  She reports having neck and head pain once every two weeks, which is relieved with ibuprofen. She stopped taking flexeril due to  increased sedation and lingering cognitive side effects.   She denies any new neurological complaints.  She has started weight watchers and lost 17lb since early 2017!  UPDATE 02/29/2016:  She responded well with neck PT and her headaches and neck pain was improved for sometime.  Starting in late January 2018, she began having neck pain, described as soreness, which sometimes also involves her arms. There is no numbness/tingling or weakness.  She feels that it may be related to how much time she spends on her computer and poor posture so is having an adjustable desk placed at home.  She tried naproxen which helps.  She also saw a chiropractor and felt that this has improved her range of motion.  No new headaches or vision changes.     UPDATE 08/29/2016:  She went to [place] in June as a road trip and when she returned, she developed severe throbbing pain at the base of her head which has been constant since this time.  She denies any tingling of numbness of the scalp or arms.  She has restarted neck physiotherapy which has helped some, but she continues to have pain at the base of her head.  She is groggy with flexeril and did not have any relief with tizandine.  She gets relief with iburpofen which she takes one every other day.  Her father-in-law passed away suddenly in 2022/06/25 and she has a lot of increased stress managing his affairs.   Medications:  Current Outpatient Prescriptions on File Prior to Visit  Medication Sig Dispense Refill  . ALPRAZolam (XANAX) 0.5 MG tablet  TAKE 1 TABLET BY MOUTH 3 TIMES A DAY AS NEEDED FOR ANXIETY 24 tablet 0  . Azelastine-Fluticasone (DYMISTA) 137-50 MCG/ACT SUSP Dymista 137 mcg-50 mcg/spray nasal spray    . buPROPion (WELLBUTRIN XL) 150 MG 24 hr tablet Take 1 tablet (150 mg total) by mouth daily. Increase to 2 po qd after one week 60 tablet 4  . cyclobenzaprine (FLEXERIL) 10 MG tablet cyclobenzaprine 10 mg tablet  TAKE 1/2 TABLET BY MOUTH EVERY DAY AS NEEDED FOR MUSCLE  SPASMS    . cyclobenzaprine (FLEXERIL) 5 MG tablet cyclobenzaprine 5 mg tablet  TAKE 1 TABLET (5 MG TOTAL) BY MOUTH AT BEDTIME AS NEEDED FOR MUSCLE SPASMS.    . diazepam (VALIUM) 5 MG tablet Take 5 mg by mouth as needed.  0  . fluticasone (FLONASE) 50 MCG/ACT nasal spray fluticasone 50 mcg/actuation nasal spray,suspension    . hydrOXYzine (ATARAX/VISTARIL) 10 MG tablet hydroxyzine HCl 10 mg tablet  TAKE 1 TABLET BY MOUTH 3 TIMES A DAY FOR 30 DAYS    . ibuprofen (ADVIL,MOTRIN) 800 MG tablet TAKE 1 TABLET BY MOUTH EVERY 8 HOURS AS NEEDED FOR CRAMPS 30 tablet 2  . lisinopril-hydrochlorothiazide (PRINZIDE,ZESTORETIC) 20-12.5 MG tablet TAKE 1 TABLET BY MOUTH DAILY. 90 tablet 0  . naproxen (NAPROSYN) 375 MG tablet naproxen 375 mg tablet    . nitrofurantoin (MACRODANTIN) 50 MG capsule     . omeprazole (PRILOSEC) 20 MG capsule TAKE ONE CAPSULE BY MOUTH EVERY DAY 90 capsule 0  . phentermine 15 MG capsule Take 1 capsule (15 mg total) by mouth every morning. 30 capsule 1  . tizanidine (ZANAFLEX) 2 MG capsule Take 1 capsule (2 mg total) by mouth 2 (two) times daily as needed for muscle spasms. 45 capsule 3  . meloxicam (MOBIC) 15 MG tablet meloxicam 15 mg tablet     No current facility-administered medications on file prior to visit.     Allergies:  Allergies  Allergen Reactions  . Bee Venom Shortness Of Breath  . Contrast Media [Iodinated Diagnostic Agents] Anaphylaxis    CT dye ivp Throat swelling    Review of Systems:  CONSTITUTIONAL: No fevers, chills, night sweats, or weight loss.  EYES: No visual changes or eye pain ENT: No hearing changes.  No history of nose bleeds.   RESPIRATORY: No cough, wheezing and shortness of breath.   CARDIOVASCULAR: Negative for chest pain, and palpitations.   GI: Negative for abdominal discomfort, blood in stools or black stools.  No recent change in bowel habits.   GU:  No history of incontinence.   MUSCLOSKELETAL: No history of joint pain or swelling.  No  myalgias.   SKIN: Negative for lesions, rash, and itching.   ENDOCRINE: Negative for cold or heat intolerance, polydipsia or goiter.   PSYCH:  No depression or anxiety symptoms.   NEURO: As Above.   Vital Signs:  BP 120/80   Pulse 75   Ht 5\' 7"  (1.702 m)   Wt 297 lb 3 oz (134.8 kg)   SpO2 97%   BMI 46.55 kg/m   Neurological Exam: MENTAL STATUS including orientation to time, place, person, recent and remote memory, attention span and concentration, language, and fund of knowledge is normal.  Speech is not dysarthric.  CRANIAL NERVES: Pupils equal round and reactive to light.  Normal conjugate, extra-ocular eye movements in all directions of gaze.  Face is symmetric. Palate elevates symmetrically.  Tongue is midline.  MOTOR:  Motor strength is 5/5 in all extremities.  She has cervical  muscle tension and reduced neck ROM with rotation, neck flexion, and side bending.   COORDINATION/GAIT:   Gait narrow based and stable.   Data: XR cervical spine 12/30/2013: Mild spondylosis at C5-6. No acute osseous abnormality.  IMPRESSION: 1.  Cervicogenic headaches, worse and exacerbated by her recent life stressors  Previously tried:  Flexeril (cognitive side effects), tizanidine (ineffective)  Start methocarbamol 500mg  twice daily as needed  Start neck physiotherapy - [facility] in [place]  Toradol 30mg  injection today  2.  Morbid obesity, she is starting phentermine as prescribed by her PCP  Return to clinic in 6 months  The duration of this appointment visit was 25 minutes of face-to-face time with the patient.  Greater than 50% of this time was spent in counseling, explanation of diagnosis, planning of further management, and coordination of care.   Thank you for allowing me to participate in patient's care.  If I can answer any additional questions, I would be pleased to do so.    Sincerely,    Aryana Wonnacott K. Posey Pronto, DO

## 2016-08-29 NOTE — Patient Instructions (Addendum)
Start methocarbamol 500mg  twice daily as needed Continue neck physiotherapy  Toradol injection today  Send my a MyChart message in 6 weeks with an update  Return to clinic in 6 months

## 2016-09-03 ENCOUNTER — Other Ambulatory Visit: Payer: Self-pay | Admitting: Internal Medicine

## 2016-09-19 NOTE — Progress Notes (Signed)
Chief Complaint  Patient presents with  . Follow-up    HPI: Beth Wallace 51 y.o. come in for Chronic disease management   Fu meds for obesity  And Chronic disease management  Going to and adhereing  To Engineer, maintenance. More fruits.  Achilles   Problematic as well as   Sciatica .  Neck  And sciatica .  But realizes increase weight  Problematic .  Takes phentermine daily  And no se noted sleep or other.  Neck exercises  Some better still problem . tryin gto get up from desk  hsa some back to thigh sx  Not felt to be gyne problem  ROS: See pertinent positives and negatives per HPI.  Past Medical History:  Diagnosis Date  . ADJ DISORDER WITH MIXED ANXIETY & DEPRESSED MOOD 12/14/2009   Qualifier: Diagnosis of  By: Regis Bill MD, Standley Brooking   . Allergic reaction 05/18/2011  . Anxiety   . GERD (gastroesophageal reflux disease)   . History of gallstones   . HTN (hypertension)   . Metrorrhagia 12/14/2009   Qualifier: Diagnosis of  By: Regis Bill MD, Standley Brooking With pelvic pain  Sees Dr Philis Pique and tobotic hysterectomy scheduled   . Obesity   . TMJ (temporomandibular joint disorder)     Family History  Problem Relation Age of Onset  . Heart disease Mother 40       Died age 81, MI  . Heart attack Mother   . Hypertension Mother   . Hypertension Father        Living  . Diabetes Father   . Heart disease Father   . Other Sister        Killed, age 29  . Other Daughter        Killed, age 79  . Hyperlipidemia Neg Hx   . Sudden death Neg Hx     Social History   Social History  . Marital status: Single    Spouse name: N/A  . Number of children: 1  . Years of education: N/A   Occupational History  . manager [employer]   Social History Main Topics  . Smoking status: Current Some Day Smoker    Packs/day: 0.25    Types: Cigarettes    Last attempt to quit: 01/22/2012  . Smokeless tobacco: Never Used     Comment: 1-2 cigarretes daily  . Alcohol use No  . Drug use: No  . Sexual activity: Not  Asked   Other Topics Concern  . 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 ALPRAZolam (XANAX) 0.5 MG tablet  TAKE 1 TABLET BY MOUTH 3 TIMES A DAY AS NEEDED FOR ANXIETY 24 tablet 0  . Azelastine-Fluticasone (DYMISTA) 137-50 MCG/ACT SUSP Dymista 137 mcg-50 mcg/spray nasal spray    . buPROPion (WELLBUTRIN XL) 150 MG 24 hr tablet Take 1 tablet (150 mg total) by mouth daily. Increase to 2 po qd after one week 60 tablet 4  . cyclobenzaprine (FLEXERIL) 10 MG tablet cyclobenzaprine 10 mg tablet  TAKE 1/2 TABLET BY MOUTH EVERY DAY AS NEEDED FOR MUSCLE SPASMS    . cyclobenzaprine (FLEXERIL) 5 MG tablet cyclobenzaprine 5 mg tablet  TAKE 1 TABLET (5 MG TOTAL) BY MOUTH AT BEDTIME AS NEEDED FOR MUSCLE SPASMS.    . diazepam (VALIUM) 5 MG tablet Take 5 mg by mouth as needed.  0  . fluticasone (FLONASE) 50 MCG/ACT nasal spray fluticasone 50 mcg/actuation nasal spray,suspension    . hydrOXYzine (ATARAX/VISTARIL) 10 MG tablet hydroxyzine HCl 10 mg tablet  TAKE 1 TABLET BY MOUTH 3 TIMES A DAY FOR 30 DAYS    . lisinopril-hydrochlorothiazide (PRINZIDE,ZESTORETIC) 20-12.5 MG tablet TAKE 1 TABLET BY MOUTH DAILY. 90 tablet 0  . meloxicam (MOBIC) 15 MG tablet meloxicam 15 mg tablet    . methocarbamol (ROBAXIN) 500 MG tablet Take 1 tablet (500 mg total) by mouth 2 (two) times daily as needed for muscle spasms (neck pain). 60 tablet 5  . naproxen (NAPROSYN) 375 MG tablet naproxen 375  mg tablet    . omeprazole (PRILOSEC) 20 MG capsule TAKE ONE CAPSULE BY MOUTH EVERY DAY 90 capsule 0  . ibuprofen (ADVIL,MOTRIN) 800 MG tablet TAKE 1 TABLET BY MOUTH EVERY 8 HOURS AS NEEDED FOR CRAMPS 30 tablet 2  . phentermine 15 MG capsule Take 1 capsule (15 mg total) by mouth every morning. 30 capsule 1  . nitrofurantoin (MACRODANTIN) 50 MG capsule      No facility-administered medications prior to visit.      EXAM:  BP 138/78 (BP Location: Left Arm, Patient Position: Sitting, Cuff Size: Large)   Temp 98.6 F (37 C) (Oral)   Ht 5\' 7"  (1.702 m)   Wt 291 lb (132 kg)   SpO2 99%   BMI 45.58 kg/m   Body mass index is 45.58 kg/m.  GENERAL: vitals reviewed and listed above, alert, oriented, appears well hydrated and in no acute distress HEENT: atraumatic, conjunctiva  clear, no obvious abnormalities on inspection of external nose and ears LUNGS: clear to auscultation bilaterally, no wheezes, rales or rhonchi, good air movement CV: HRRR, no clubbing cyanosis or  peripheral edema nl cap refill  MS: moves all extremities without noticeable focal  abnormality PSYCH: pleasant and cooperative, no obvious depression or anxiety Lab Results  Component Value Date   WBC 5.3 04/12/2016   HGB 14.1 04/12/2016   HCT 42.2 04/12/2016   PLT 309.0 04/12/2016   GLUCOSE 104 (H) 04/12/2016   CHOL 177 04/12/2016   TRIG 103.0 04/12/2016   HDL 37.20 (L) 04/12/2016   LDLCALC 119 (H) 04/12/2016   ALT 23 04/12/2016   AST 19 04/12/2016   NA 140 04/12/2016   K 4.6 04/12/2016   CL 101 04/12/2016   CREATININE 0.79 04/12/2016   BUN 15 04/12/2016   CO2 33 (H) 04/12/2016   TSH 1.09 04/12/2016   HGBA1C 6.2 04/12/2016   MICROALBUR <0.7 11/19/2014   BP Readings from Last 3 Encounters:  09/20/16 138/78  08/29/16 120/80  08/20/16 138/82   Wt Readings from Last 3 Encounters:  09/20/16 291 lb (132 kg)  08/29/16 297  lb 3 oz (134.8 kg)  08/20/16 289 lb (131.1 kg)    ASSESSMENT AND PLAN:  Discussed  the following assessment and plan:  Morbid obesity, unspecified obesity type ([place]) - 6 # weight loss one month  inc dose attention to ls strategies   Medication management  Essential hypertension  Need for influenza vaccination - Plan: Flu Vaccine QUAD 36+ mos IM  Cervicogenic headache 6# weight loss in one month  No  Sig   se of meds   Strategies reviewd and  Inc dose of medication    ROV in 2 mos  Continue [employer] etc .  Hopefully  Her ms issues can improve with weight loss    Caution with achilles issues  Ice etc.  Can get hg a1c at next visit  Or when appropriate    Expectant management.    Benefit more than risk of medications  to continue.   -Patient advised to return or notify health care team  if  new concerns arise.  Patient Instructions  Continue weight watcher s and inc med dose  Flu vaccine  Today  ROV in 2 months.       Standley Brooking. Loreal Schuessler M.D.

## 2016-09-20 ENCOUNTER — Encounter: Payer: Self-pay | Admitting: Internal Medicine

## 2016-09-20 ENCOUNTER — Ambulatory Visit (INDEPENDENT_AMBULATORY_CARE_PROVIDER_SITE_OTHER): Payer: 59 | Admitting: Internal Medicine

## 2016-09-20 DIAGNOSIS — Z79899 Other long term (current) drug therapy: Secondary | ICD-10-CM

## 2016-09-20 DIAGNOSIS — I10 Essential (primary) hypertension: Secondary | ICD-10-CM

## 2016-09-20 DIAGNOSIS — R51 Headache: Secondary | ICD-10-CM

## 2016-09-20 DIAGNOSIS — G4486 Cervicogenic headache: Secondary | ICD-10-CM

## 2016-09-20 DIAGNOSIS — Z23 Encounter for immunization: Secondary | ICD-10-CM | POA: Diagnosis not present

## 2016-09-20 MED ORDER — PHENTERMINE HCL 30 MG PO CAPS: 30.0000 mg | ORAL_CAPSULE | ORAL | 1 refills | Status: DC

## 2016-09-20 MED ORDER — IBUPROFEN 800 MG PO TABS: ORAL_TABLET | ORAL | 2 refills | Status: DC

## 2016-09-20 NOTE — Patient Instructions (Signed)
Continue weight watcher s and inc med dose  Flu vaccine  Today  ROV in 2 months.

## 2016-10-05 ENCOUNTER — Encounter: Payer: Self-pay | Admitting: Internal Medicine

## 2016-10-09 ENCOUNTER — Other Ambulatory Visit: Payer: Self-pay | Admitting: Neurology

## 2016-11-20 NOTE — Progress Notes (Deleted)
No chief complaint on file.   HPI: Beth Wallace 51 y.o. come in for Chronic disease management   Morbid obesity, unspecified obesity type ([place]) - 6 # weight loss one month  inc dose attention to ls strategies   Medication management  Essential hypertension  Need for influenza vaccination - Plan: Flu Vaccine QUAD 36+ mos IM  Cervicogenic headache 6# weight loss in one month  No  Sig   se of meds   Strategies reviewd and  Inc dose of medication    ROV in 2 mos  Continue [employer] etc .  Hopefully  Her ms issues can improve with weight loss    Caution with achilles issues  Ice etc.  Can get hg a1c at next visit  Or when appropriate    Expectant management.    Benefit more than risk of medications  to continue.   -Patient advised to return or notify health care team  if  new concerns arise.  Patient Instructions  Continue weight watcher s and inc med dose  Flu vaccine  Today  ROV in 2 months.   ROS: See pertinent positives and negatives per HPI.  Past Medical History:  Diagnosis Date  . ADJ DISORDER WITH MIXED ANXIETY & DEPRESSED MOOD 12/14/2009   Qualifier: Diagnosis of  By: Regis Bill MD, Standley Brooking   . Allergic reaction 05/18/2011  . Anxiety   . GERD (gastroesophageal reflux disease)   . History of gallstones   . HTN (hypertension)   . Metrorrhagia 12/14/2009   Qualifier: Diagnosis of  By: Regis Bill MD, Standley Brooking With pelvic pain  Sees Dr Philis Pique and tobotic hysterectomy scheduled   . Obesity   . TMJ (temporomandibular joint disorder)     Family History  Problem Relation Age of Onset  . Heart disease Mother 62       Died age 58, MI  . Heart attack Mother   . Hypertension Mother   . Hypertension Father        Living  . Diabetes Father   . Heart disease Father   . Other Sister        Killed, age 39  . Other Daughter        Killed, age 70  . Hyperlipidemia Neg Hx   . Sudden death Neg Hx     Social History   Socioeconomic History  . Marital status: Single   Spouse name: Not on file  . Number of children: 1  . Years of education: Not on file  . Highest education level: Not on file  Social Needs  . Financial resource strain: Not on file  . Food insecurity - worry: Not on file  . Food insecurity - inability: Not on file  . Transportation needs - medical: Not on file  . Transportation needs - non-medical: Not on file  Occupational History  . Occupation: Best boy: [employer]  Tobacco Use  . Smoking status: Current Some Day Smoker    Packs/day: 0.25    Types: Cigarettes    Last attempt to quit: 01/22/2012    Years since quitting: 4.8  . Smokeless tobacco: Never Used  . Tobacco comment: 1-2 cigarretes daily  Substance and Sexual Activity  . Alcohol use: No    Alcohol/week: 0.0 oz  . Drug use: No  . Sexual activity: Not on file  Other Topics Concern  . 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 ALPRAZolam (XANAX) 0.5 MG tablet TAKE 1 TABLET BY MOUTH 3 TIMES A DAY AS NEEDED FOR ANXIETY 24 tablet 0  . Azelastine-Fluticasone (DYMISTA) 137-50 MCG/ACT SUSP Dymista 137 mcg-50 mcg/spray nasal spray    . buPROPion (WELLBUTRIN XL) 150 MG 24 hr tablet Take 1 tablet (150 mg total) by mouth daily. Increase to 2 po qd after one week 60 tablet 4  . cyclobenzaprine (FLEXERIL) 10 MG tablet cyclobenzaprine 10 mg tablet  TAKE 1/2 TABLET BY MOUTH EVERY DAY AS NEEDED FOR MUSCLE SPASMS    . cyclobenzaprine  (FLEXERIL) 5 MG tablet cyclobenzaprine 5 mg tablet  TAKE 1 TABLET (5 MG TOTAL) BY MOUTH AT BEDTIME AS NEEDED FOR MUSCLE SPASMS.    . cyclobenzaprine (FLEXERIL) 5 MG tablet TAKE 1 TABLET (5 MG TOTAL) BY MOUTH AT BEDTIME AS NEEDED FOR MUSCLE SPASMS. 30 tablet 4  . diazepam (VALIUM) 5 MG tablet Take 5 mg by mouth as needed.  0  . fluticasone (FLONASE) 50 MCG/ACT nasal spray fluticasone 50 mcg/actuation nasal spray,suspension    . hydrOXYzine (ATARAX/VISTARIL) 10 MG tablet hydroxyzine HCl 10 mg tablet  TAKE 1 TABLET BY MOUTH 3 TIMES A DAY FOR 30 DAYS    . ibuprofen (ADVIL,MOTRIN) 800 MG tablet TAKE 1 TABLET BY MOUTH EVERY 8 HOURS AS NEEDED FOR CRAMPS 30 tablet 2  . lisinopril-hydrochlorothiazide (PRINZIDE,ZESTORETIC) 20-12.5 MG tablet TAKE 1 TABLET BY MOUTH DAILY. 90 tablet 0  . meloxicam (MOBIC) 15 MG tablet meloxicam 15 mg tablet    . methocarbamol (ROBAXIN) 500 MG tablet Take 1 tablet (500 mg total) by mouth 2 (two) times daily as needed for muscle spasms (neck pain). 60 tablet 5  . naproxen (NAPROSYN) 375 MG tablet naproxen 375 mg tablet    . nitrofurantoin (MACRODANTIN) 50 MG capsule     . omeprazole (PRILOSEC) 20 MG capsule TAKE ONE CAPSULE BY MOUTH EVERY DAY 90 capsule 0  . phentermine 30 MG capsule Take 1 capsule (30 mg total) by mouth every morning. 30 capsule 1   No facility-administered medications prior to visit.      EXAM:  There were no vitals taken for this visit.  There is no height or weight on file to calculate BMI.  GENERAL: vitals reviewed and listed above, alert, oriented, appears well hydrated and in no acute distress HEENT: atraumatic, conjunctiva  clear, no obvious abnormalities on inspection of external nose and ears OP : no lesion edema or exudate  NECK: no obvious masses on inspection palpation  LUNGS: clear to auscultation bilaterally, no wheezes, rales or rhonchi, good air movement CV: HRRR, no clubbing cyanosis or  peripheral edema nl cap refill  MS:  moves all  extremities without noticeable focal  abnormality PSYCH: pleasant and cooperative, no obvious depression or anxiety Lab Results  Component Value Date   WBC 5.3 04/12/2016   HGB 14.1 04/12/2016   HCT 42.2 04/12/2016   PLT 309.0 04/12/2016   GLUCOSE 104 (H) 04/12/2016   CHOL 177 04/12/2016   TRIG 103.0 04/12/2016   HDL 37.20 (L) 04/12/2016   LDLCALC 119 (H) 04/12/2016   ALT 23 04/12/2016   AST 19 04/12/2016   NA 140 04/12/2016   K 4.6 04/12/2016   CL 101 04/12/2016   CREATININE 0.79 04/12/2016   BUN 15 04/12/2016   CO2 33 (H) 04/12/2016   TSH 1.09 04/12/2016   HGBA1C 6.2 04/12/2016   MICROALBUR <0.7 11/19/2014   BP Readings from Last 3 Encounters:  09/20/16 138/78  08/29/16 120/80  08/20/16 138/82    ASSESSMENT AND PLAN:  Discussed the following assessment and plan:  Morbid obesity, unspecified obesity type [facility])  Medication management  Essential hypertension  -Patient advised to return or notify health care team  if  new concerns arise.  There are no Patient Instructions on file for this visit.   Standley Brooking. Panosh M.D.

## 2016-11-21 ENCOUNTER — Ambulatory Visit: Payer: 59 | Admitting: Internal Medicine

## 2016-11-21 DIAGNOSIS — Z0289 Encounter for other administrative examinations: Secondary | ICD-10-CM

## 2016-11-26 ENCOUNTER — Other Ambulatory Visit: Payer: Self-pay | Admitting: Internal Medicine

## 2016-12-29 ENCOUNTER — Other Ambulatory Visit: Payer: Self-pay | Admitting: Internal Medicine

## 2017-01-21 ENCOUNTER — Telehealth: Payer: Self-pay | Admitting: Neurology

## 2017-01-21 NOTE — Telephone Encounter (Signed)
Pt called and wants a referral for physical therapy

## 2017-01-21 NOTE — Telephone Encounter (Signed)
Patient ok to go to [facility] since [facility] does not have a PT at this time.  Will send referral to [facility].  Phone: [phone] Fax: [phone]

## 2017-01-22 ENCOUNTER — Encounter (HOSPITAL_COMMUNITY): Payer: Self-pay | Admitting: Emergency Medicine

## 2017-01-22 ENCOUNTER — Emergency Department (HOSPITAL_COMMUNITY)
Admission: EM | Admit: 2017-01-22 | Discharge: 2017-01-22 | Disposition: A | Discharge status: Home / Self Care | Payer: BLUE CROSS/BLUE SHIELD | Attending: Emergency Medicine | Admitting: Emergency Medicine

## 2017-01-22 ENCOUNTER — Other Ambulatory Visit: Payer: Self-pay

## 2017-01-22 DIAGNOSIS — M25512 Pain in left shoulder: Secondary | ICD-10-CM | POA: Diagnosis present

## 2017-01-22 DIAGNOSIS — F17210 Nicotine dependence, cigarettes, uncomplicated: Secondary | ICD-10-CM | POA: Insufficient documentation

## 2017-01-22 DIAGNOSIS — Z79899 Other long term (current) drug therapy: Secondary | ICD-10-CM | POA: Diagnosis not present

## 2017-01-22 DIAGNOSIS — I10 Essential (primary) hypertension: Secondary | ICD-10-CM | POA: Diagnosis not present

## 2017-01-22 MED ORDER — DEXAMETHASONE 4 MG PO TABS
12.0000 mg | ORAL_TABLET | Freq: Once | ORAL | Status: AC
  Administered 2017-01-22: 12 mg via ORAL
  Filled 2017-01-22: qty 3

## 2017-01-22 MED ORDER — OXYCODONE-ACETAMINOPHEN 5-325 MG PO TABS: 1.0000 | ORAL_TABLET | Freq: Four times a day (QID) | ORAL | 0 refills | Status: DC | PRN

## 2017-01-22 MED ORDER — BUPIVACAINE HCL (PF) 0.25 % IJ SOLN
10.0000 mL | Freq: Once | INTRAMUSCULAR | Status: AC
  Administered 2017-01-22: 10 mL
  Filled 2017-01-22: qty 30

## 2017-01-22 MED ORDER — DIAZEPAM 5 MG PO TABS: 2.5000 mg | ORAL_TABLET | Freq: Three times a day (TID) | ORAL | 0 refills | Status: DC | PRN

## 2017-01-22 MED ORDER — LIDOCAINE HCL (PF) 1 % IJ SOLN
5.0000 mL | Freq: Once | INTRAMUSCULAR | Status: AC
  Administered 2017-01-22: 5 mL
  Filled 2017-01-22: qty 6

## 2017-01-22 MED ORDER — HYDROMORPHONE HCL 1 MG/ML IJ SOLN
1.0000 mg | Freq: Once | INTRAMUSCULAR | Status: AC
  Administered 2017-01-22: 1 mg via INTRAMUSCULAR
  Filled 2017-01-22: qty 1

## 2017-01-22 MED ORDER — LORAZEPAM 0.5 MG PO TABS
0.5000 mg | ORAL_TABLET | Freq: Once | ORAL | Status: AC
  Administered 2017-01-22: 0.5 mg via ORAL
  Filled 2017-01-22: qty 1

## 2017-01-22 NOTE — ED Triage Notes (Signed)
Pt states that she has been having pain in her neck left arm and upper back for about a week

## 2017-01-22 NOTE — Discharge Instructions (Signed)
Keep taking ibuprofen 600 mg every 6 hours as needed for pain. It is safe to take this with prescribed medications.

## 2017-01-22 NOTE — ED Provider Notes (Signed)
[facility] EMERGENCY DEPARTMENT Provider Note   CSN: [id number] Arrival date & time: 01/22/17  4098     History   Chief Complaint Chief Complaint  Patient presents with  . Neck Pain  . Shoulder Pain  . Back Pain    HPI Beth Wallace is a 52 y.o. female.  HPI   52 year old female with left lateral neck/shoulder pain.  Gradual onset about a week ago.  Constant and progressive.  Significantly worse when she got up this morning.  Denies any discrete trauma or strain.  She has been trying ibuprofen with minimal improvement.  Radicular symptoms into her left upper extremity.  No fevers or chills.  No rash.  No weakness.  Past Medical History:  Diagnosis Date  . ADJ DISORDER WITH MIXED ANXIETY & DEPRESSED MOOD 12/14/2009   Qualifier: Diagnosis of  By: Regis Bill MD, Standley Brooking   . Allergic reaction 05/18/2011  . Anxiety   . GERD (gastroesophageal reflux disease)   . History of gallstones   . HTN (hypertension)   . Metrorrhagia 12/14/2009   Qualifier: Diagnosis of  By: Regis Bill MD, Standley Brooking With pelvic pain  Sees Dr Philis Pique and tobotic hysterectomy scheduled   . Obesity   . TMJ (temporomandibular joint disorder)     Patient Active Problem List   Diagnosis Date Noted  . BMI 40.0-44.9, adult ([place]) 04/20/2016  . Cervicogenic headache 04/15/2015  . Vitamin D deficiency 11/19/2014  . Essential hypertension 11/19/2014  . Hyperglycemia 09/08/2013  . Low HDL (under 40) 04/08/2013  . Visit for preventive health examination 04/08/2013  . Severe obesity (BMI >= 40) ([place]) 04/08/2013  . Achilles tendinitis 10/08/2012  . Back pain 07/08/2012  . Sciatica neuralgia 07/08/2012  . Stress 01/23/2012  . Rapid palpitations 01/23/2012  . H/O bee sting allergy 05/18/2011  . Fatigue 04/25/2011  . GERD (gastroesophageal reflux disease) 04/12/2011  . Dysphagia, unspecified(787.20) 04/12/2011  . Upper abdominal pain 04/12/2011  . Chest discomfort 03/13/2011  . OBESITY 01/31/2010  . HYPERTENSION  12/14/2009  . BACK PAIN WITH RADICULOPATHY 12/14/2009    Past Surgical History:  Procedure Laterality Date  . ABDOMINAL HYSTERECTOMY  09/2010   partial  . CESAREAN SECTION    . CHOLECYSTECTOMY  1989  . WISDOM TOOTH EXTRACTION  2010    OB History    Gravida Para Term Preterm AB Living   2 1 1      0   SAB TAB Ectopic Multiple Live Births                   Home Medications    Prior to Admission medications   Medication Sig Start Date End Date Taking? Authorizing Provider  ALPRAZolam (XANAX) 0.5 MG tablet TAKE 1 TABLET BY MOUTH 3 TIMES A DAY AS NEEDED FOR ANXIETY 09/09/15  Yes Panosh, Standley Brooking, MD  Azelastine-Fluticasone (DYMISTA) 137-50 MCG/ACT SUSP Dymista 137 mcg-50 mcg/spray nasal spray   Yes [provider]  cetirizine (ZYRTEC) 10 MG tablet Take 10 mg by mouth at bedtime.   Yes [provider]  cyclobenzaprine (FLEXERIL) 5 MG tablet cyclobenzaprine 5 mg tablet  TAKE 1 TABLET (5 MG TOTAL) BY MOUTH AT BEDTIME AS NEEDED FOR MUSCLE SPASMS.   Yes [provider]  fexofenadine (ALLEGRA) 180 MG tablet Take 180 mg by mouth daily.   Yes [provider]  hydrOXYzine (ATARAX/VISTARIL) 10 MG tablet Take 10 mg by mouth 3 (three) times daily as needed for itching.   Yes [provider]  ibuprofen (  ADVIL,MOTRIN) 800 MG tablet TAKE 1 TABLET BY MOUTH EVERY 8 HOURS AS NEEDED FOR CRAMPS 09/20/16  Yes Panosh, Standley Brooking, MD  lisinopril-hydrochlorothiazide (PRINZIDE,ZESTORETIC) 20-12.5 MG tablet TAKE 1 TABLET BY MOUTH EVERY DAY 12/31/16  Yes Panosh, Standley Brooking, MD  methocarbamol (ROBAXIN) 500 MG tablet Take 1 tablet (500 mg total) by mouth 2 (two) times daily as needed for muscle spasms (neck pain). 08/29/16  Yes Patel, Donika K, DO  naproxen (NAPROSYN) 375 MG tablet Take 375 mg by mouth daily as needed for mild pain.   Yes [provider]  naproxen sodium (ALEVE) 220 MG tablet Take 440 mg by mouth daily as needed (pain).   Yes [provider]    omeprazole (PRILOSEC) 20 MG capsule TAKE 1 CAPSULE BY MOUTH EVERY DAY 12/31/16  Yes Panosh, Standley Brooking, MD  diazepam (VALIUM) 5 MG tablet Take 0.5 tablets (2.5 mg total) by mouth every 8 (eight) hours as needed for muscle spasms. 01/22/17   Virgel Manifold, MD  oxyCODONE-acetaminophen (PERCOCET/ROXICET) 5-325 MG tablet Take 1 tablet by mouth every 6 (six) hours as needed for severe pain. 01/22/17   Virgel Manifold, MD    Family History Family History  Problem Relation Age of Onset  . Heart disease Mother 79       Died age 11, MI  . Heart attack Mother   . Hypertension Mother   . Hypertension Father        Living  . Diabetes Father   . Heart disease Father   . Other Sister        Killed, age 86  . Other Daughter        Killed, age 78  . Hyperlipidemia Neg Hx   . Sudden death Neg Hx     Social History Social History   Tobacco Use  . Smoking status: Current Some Day Smoker    Packs/day: 0.25    Types: Cigarettes    Last attempt to quit: 01/22/2012    Years since quitting: 5.0  . Smokeless tobacco: Never Used  . Tobacco comment: 1-2 cigarretes daily  Substance Use Topics  . Alcohol use: No    Alcohol/week: 0.0 oz  . Drug use: No     Allergies   Bee venom and Contrast media [iodinated diagnostic agents]   Review of Systems Review of Systems  All systems reviewed and negative, other than as noted in HPI.  Physical Exam Updated Vital Signs BP (!) 154/90 (BP Location: Right Arm)   Pulse 71   Temp 98.1 F (36.7 C) (Oral)   Resp 18   Ht 5' 7.5" (1.715 m)   Wt 127 kg (280 lb)   SpO2 98%   BMI 43.21 kg/m   Physical Exam  Constitutional: She appears well-developed and well-nourished. No distress.  Laying in bed.  No acute distress.  Obese.  HENT:  Head: Normocephalic and atraumatic.  Eyes: Conjunctivae are normal. Right eye exhibits no discharge. Left eye exhibits no discharge.  Neck: Neck supple.  Cardiovascular: Normal rate, regular rhythm and normal heart sounds.  Exam reveals no gallop and no friction rub.  No murmur heard. Pulmonary/Chest: Effort normal and breath sounds normal. No respiratory distress.  Abdominal: Soft. She exhibits no distension. There is no tenderness.  Musculoskeletal: She exhibits no edema or tenderness.  Significant tenderness to palpation on the left lateral neck and left upper back.  No midline spinal tenderness.  No concerning skin lesions.  Strength is 5 out of 5 and symmetric in  her upper extremities.  Sensation is intact to light touch.  Palpable radial pulses.   Neurological: She is alert.  Skin: Skin is warm and dry.  Psychiatric: She has a normal mood and affect. Her behavior is normal. Thought content normal.  Nursing note and vitals reviewed.    ED Treatments / Results  Labs (all labs ordered are listed, but only abnormal results are displayed) Labs Reviewed - No data to display  EKG  EKG Interpretation None       Radiology No results found.  Procedures Procedures (including critical care time)  Procedure Note: Trigger Point Injection (2 point) - L Lower Paracervical Muscular Injection and L trapezius.   Indication: Shoulder/neck pain  Consent: Informed verbal consent obtained. Detailed procedure including risks/benefits including but not limited to:   Soreness.  Bruising.  Stiffness.  More serious problems are rare. But, they may include:  Bleeding under the skin (hematoma).  Skin infection.  Breaking off of the needle under your skin.  Lung puncture.  The trigger point injection may not work for you.  Contraindications: none noted  Positioning: seated upright  Procedure: Area was cleaned with alcohol. B/l paracervical musculature at C 6/7 level was injected using C7 spinous process as landmark. 2cc total of 1:1 0.25% bupivacaine/1% lidocaine without epinephrine injected with 1.5 inch 25 g needle after making a small wheel and using distracting digital pressure.  Needle directed  slightly cephalad from parallel to floor. Aspirated prior to injection.  A second injection was performed at the site of maximal tenderness more laterally into the upper trapezius with the same medications.  Pt tolerated well with no apparent immediate complication.     Medications Ordered in ED Medications  bupivacaine (PF) (MARCAINE) 0.25 % injection 10 mL (10 mLs Infiltration Given 01/22/17 1148)  lidocaine (PF) (XYLOCAINE) 1 % injection 5 mL (5 mLs Other Given 01/22/17 1147)  LORazepam (ATIVAN) tablet 0.5 mg (0.5 mg Oral Given 01/22/17 1148)  HYDROmorphone (DILAUDID) injection 1 mg (1 mg Intramuscular Given 01/22/17 1147)  dexamethasone (DECADRON) tablet 12 mg (12 mg Oral Given 01/22/17 1148)     Initial Impression / Assessment and Plan / ED Course  I have reviewed the triage vital signs and the nursing notes.  Pertinent labs & imaging results that were available during my care of the patient were reviewed by me and considered in my medical decision making (see chart for details).     52 year old female with history exam consistent with musculoskeletal pain.  She was given trigger point injections.  Continue symptomatic treatment.  She has no midline spinal tenderness it does not have radicular symptoms and has a nonfocal neurological examination.  Final Clinical Impressions(s) / ED Diagnoses   Final diagnoses:  Acute pain of left shoulder    ED Discharge Orders        Ordered    oxyCODONE-acetaminophen (PERCOCET/ROXICET) 5-325 MG tablet  Every 6 hours PRN     01/22/17 1145    diazepam (VALIUM) 5 MG tablet  Every 8 hours PRN     01/22/17 1145       Virgel Manifold, MD 01/22/17 1153

## 2017-01-24 ENCOUNTER — Other Ambulatory Visit: Payer: Self-pay | Admitting: *Deleted

## 2017-01-24 DIAGNOSIS — G44221 Chronic tension-type headache, intractable: Secondary | ICD-10-CM

## 2017-01-24 DIAGNOSIS — R51 Headache: Principal | ICD-10-CM

## 2017-01-24 DIAGNOSIS — G4486 Cervicogenic headache: Secondary | ICD-10-CM

## 2017-01-24 NOTE — Progress Notes (Unsigned)
am

## 2017-01-28 ENCOUNTER — Inpatient Hospital Stay: Payer: BLUE CROSS/BLUE SHIELD | Admitting: Internal Medicine

## 2017-01-28 ENCOUNTER — Encounter: Payer: Self-pay | Admitting: Neurology

## 2017-01-28 ENCOUNTER — Ambulatory Visit (INDEPENDENT_AMBULATORY_CARE_PROVIDER_SITE_OTHER): Payer: BLUE CROSS/BLUE SHIELD | Admitting: Neurology

## 2017-01-28 VITALS — BP 110/80 | HR 82 | Ht 67.0 in | Wt 283.0 lb

## 2017-01-28 DIAGNOSIS — M542 Cervicalgia: Secondary | ICD-10-CM | POA: Diagnosis not present

## 2017-01-28 MED ORDER — TIZANIDINE HCL 2 MG PO TABS: 2.0000 mg | ORAL_TABLET | Freq: Every evening | ORAL | 5 refills | Status: DC | PRN

## 2017-01-28 NOTE — Patient Instructions (Addendum)
Encouraged to take break as needed, especially to stretch and strengthen the upper neck region.  Start tizanidine 2mg  at bedtime   Continue methocarbamol 500mg  in the morning  Restart physical therapy  Return to clinic in 6 month

## 2017-01-28 NOTE — Progress Notes (Signed)
Follow-up Visit   Date: 01/28/17    Beth Wallace MRN: [id number] DOB: [birth year]   Interim History: Beth Wallace is a 52 y.o. right-handed African American female with hypertension, seasonal allergies, and GERD returning to the clinic with new complaints of left neck and shoulder pain. She was previously seen for cervicogenic headaches.  The patient was accompanied to the clinic by self.  History of present illness: Starting around January 2016, she started developing pressure over the vertex of the head and ocassionally over the forehead. Pain is improved by walking and resting. It is worse when she is working and lasts several hours. It can occur daily but she usually does not notice the discomfort when she is not working. She has tried excedrin migraine, aleve, and tylenol none which provided significant relief. She was given prednisone injection but this did not help either.   She previously had migraine when pregnant which would be associated with photophobia and phonophobia.   She is a delightful lady who unfortunately has lost her 47 year old daughter and younger sister in a triple homicide. She has a tremendous strength and integrity of character and has managed to deal with such tragedy in an effective manner.   In early 2017, she started neck physiotherapy with at least 50% improvement in her head pressure. Starting in late January 2018, she began having neck pain, described as soreness, which sometimes also involves her arms. There is no numbness/tingling or weakness.  She feels that it may be related to how much time she spends on her computer and poor posture so is having an adjustable desk placed at home.  She tried naproxen which helps.  She also saw a chiropractor and felt that this has improved her range of motion.  No new headaches or vision changes.     UPDATE 08/29/2016:  She went to [place] in June as a road trip and when she returned, she  developed severe throbbing pain at the base of her head which has been constant since this time.  She denies any tingling of numbness of the scalp or arms.  She has restarted neck physiotherapy which has helped some, but she continues to have pain at the base of her head.  She is groggy with flexeril and did not have any relief with tizandine.  She gets relief with iburpofen which she takes one every other day.  Her father-in-law passed away suddenly in June 20, 2022 and she has a lot of increased stress managing his affairs.   UPDATE 01/28/2017:  She was doing really well until earlier this year.  She was told that employees at [employer] were going to be laid off which made her worry about her team.  The following day, started having severe left neck tension and achy pain limited neck movement.  On January 8th, she went to [facility] ER and was administered trigger point injection, oxycodone, and valium.  She takes flexeril 5mg  at bedtime, but makes her too sleepy, so takes methocarbamol during the day.  Over the past week, symptoms have improved and she no longer had radiating pain into her left shoulder, but she continues to have neck stiffness.    Medications:  Current Outpatient Medications on File Prior to Visit  Medication Sig Dispense Refill  . ALPRAZolam (XANAX) 0.5 MG tablet TAKE 1 TABLET BY MOUTH 3 TIMES A DAY AS NEEDED FOR ANXIETY 24 tablet 0  . cetirizine (ZYRTEC) 10 MG tablet Take 10 mg by mouth at bedtime.    Marland Kitchen  fexofenadine (ALLEGRA) 180 MG tablet Take 180 mg by mouth daily.    . hydrOXYzine (ATARAX/VISTARIL) 10 MG tablet Take 10 mg by mouth 3 (three) times daily as needed for itching.    Marland Kitchen ibuprofen (ADVIL,MOTRIN) 800 MG tablet TAKE 1 TABLET BY MOUTH EVERY 8 HOURS AS NEEDED FOR CRAMPS 30 tablet 2  . lisinopril-hydrochlorothiazide (PRINZIDE,ZESTORETIC) 20-12.5 MG tablet TAKE 1 TABLET BY MOUTH EVERY DAY 30 tablet 1  . methocarbamol (ROBAXIN) 500 MG tablet Take 1 tablet (500 mg total) by mouth 2 (two)  times daily as needed for muscle spasms (neck pain). 60 tablet 5  . naproxen (NAPROSYN) 375 MG tablet Take 375 mg by mouth daily as needed for mild pain.    . naproxen sodium (ALEVE) 220 MG tablet Take 440 mg by mouth daily as needed (pain).    Marland Kitchen omeprazole (PRILOSEC) 20 MG capsule TAKE 1 CAPSULE BY MOUTH EVERY DAY 30 capsule 1  . oxyCODONE-acetaminophen (PERCOCET/ROXICET) 5-325 MG tablet Take 1 tablet by mouth every 6 (six) hours as needed for severe pain. 10 tablet 0  . Azelastine-Fluticasone (DYMISTA) 137-50 MCG/ACT SUSP Dymista 137 mcg-50 mcg/spray nasal spray     No current facility-administered medications on file prior to visit.     Allergies:  Allergies  Allergen Reactions  . Bee Venom Shortness Of Breath  . Contrast Media [Iodinated Diagnostic Agents] Anaphylaxis    CT dye ivp Throat swelling    Review of Systems:  CONSTITUTIONAL: No fevers, chills, night sweats, or weight loss.  EYES: No visual changes or eye pain ENT: No hearing changes.  No history of nose bleeds.   RESPIRATORY: No cough, wheezing and shortness of breath.   CARDIOVASCULAR: Negative for chest pain, and palpitations.   GI: Negative for abdominal discomfort, blood in stools or black stools.  No recent change in bowel habits.   GU:  No history of incontinence.   MUSCLOSKELETAL: No history of joint pain or swelling.  No myalgias.   SKIN: Negative for lesions, rash, and itching.   ENDOCRINE: Negative for cold or heat intolerance, polydipsia or goiter.   PSYCH:  No depression or anxiety symptoms.   NEURO: As Above.   Vital Signs:  BP 110/80   Pulse 82   Ht 5\' 7"  (1.702 m)   Wt 283 lb (128.4 kg)   SpO2 99%   BMI 44.32 kg/m   Neurological Exam: MENTAL STATUS including orientation to time, place, person, recent and remote memory, attention span and concentration, language, and fund of knowledge is normal.  Speech is not dysarthric.  CRANIAL NERVES: Pupils equal round and reactive to light.  Normal  conjugate, extra-ocular eye movements in all directions of gaze.  Face is symmetric. Palate elevates symmetrically.  Tongue is midline.  MOTOR:  Motor strength is 5/5 in all extremities.  She has cervical muscle tension and reduced neck ROM with rotation, neck flexion, and side bending.   COORDINATION/GAIT:   Gait narrow based and stable.   Data: XR cervical spine 12/30/2013: Mild spondylosis at C5-6. No acute osseous abnormality.  IMPRESSION: Cervicalgia, stress-induced, with possible C4-5 cervical radiculopathy  - Start neck physiotherapy  - Continue methocarbamol 500mg  in the morning (less sedation) and start tizanidine 2mg  at bedtime (more effective)  - D/C flexeril and valium  - If symptoms get worse, consider MRI cervical spine wo contrast to assess for nerve impingement  Return to clinic in 6 months  Greater than 50% of this 25 minute visit was spent in counseling, explanation of  diagnosis, planning of further management, and coordination of care.  Thank you for allowing me to participate in patient's care.  If I can answer any additional questions, I would be pleased to do so.    Sincerely,    Joss Mcdill K. Posey Pronto, DO

## 2017-01-29 ENCOUNTER — Inpatient Hospital Stay: Payer: BLUE CROSS/BLUE SHIELD | Admitting: Internal Medicine

## 2017-02-06 ENCOUNTER — Inpatient Hospital Stay: Payer: BLUE CROSS/BLUE SHIELD | Admitting: Internal Medicine

## 2017-03-01 ENCOUNTER — Ambulatory Visit: Payer: 59 | Admitting: Neurology

## 2017-03-07 ENCOUNTER — Other Ambulatory Visit: Payer: Self-pay

## 2017-03-07 ENCOUNTER — Telehealth: Payer: Self-pay | Admitting: Internal Medicine

## 2017-03-07 ENCOUNTER — Encounter: Payer: Self-pay | Admitting: Internal Medicine

## 2017-03-07 MED ORDER — LISINOPRIL-HYDROCHLOROTHIAZIDE 20-12.5 MG PO TABS: 1.0000 | ORAL_TABLET | Freq: Every day | ORAL | 1 refills | Status: DC

## 2017-03-07 MED ORDER — OMEPRAZOLE 20 MG PO CPDR: DELAYED_RELEASE_CAPSULE | ORAL | 2 refills | Status: DC

## 2017-03-07 NOTE — Telephone Encounter (Signed)
Copied from [place]. Topic: Quick Communication - Rx Refill/Question >> Mar 07, 2017 11:07 AM Clack, Janett Billow D wrote: Medication:  omeprazole (PRILOSEC) 20 MG capsule [[id number]]  and lisinopril-hydrochlorothiazide (PRINZIDE,ZESTORETIC) 20-12.5 MG tablet [[id number]]    Has the patient contacted their pharmacy? Yes.     (Agent: If no, request that the patient contact the pharmacy for the refill.)   Preferred Pharmacy (with phone number or street name): [id number] - [place], [place] [phone] (Phone) [phone] (Fax)  Pt states her insurance company now requires a 90 day supply, also she is out of the meds.   Agent: Please be advised that RX refills may take up to 3 business days. We ask that you follow-up with your pharmacy.

## 2017-03-08 MED ORDER — LISINOPRIL-HYDROCHLOROTHIAZIDE 20-12.5 MG PO TABS: 1.0000 | ORAL_TABLET | Freq: Every day | ORAL | 0 refills | Status: DC

## 2017-03-08 MED ORDER — OMEPRAZOLE 20 MG PO CPDR: DELAYED_RELEASE_CAPSULE | ORAL | 0 refills | Status: DC

## 2017-03-28 ENCOUNTER — Other Ambulatory Visit: Payer: Self-pay | Admitting: Neurology

## 2017-04-30 ENCOUNTER — Encounter: Payer: 59 | Admitting: Internal Medicine

## 2017-05-06 ENCOUNTER — Encounter: Payer: BLUE CROSS/BLUE SHIELD | Admitting: Internal Medicine

## 2017-05-29 ENCOUNTER — Other Ambulatory Visit: Payer: Self-pay | Admitting: Internal Medicine

## 2017-06-11 NOTE — Progress Notes (Signed)
Chief Complaint  Patient presents with  . Annual Exam    Itching all over body    HPI: Patient  Beth Wallace  52 y.o. comes in today for Preventive Health Care visit  And Chronic disease management    Bp  Taking med     Not checking   But no se   Had been ok with phentermine    then     Vision blurred    Glasses >  phentermine  Really helped   Lose weight but never came back  Lives in [place]  Because " But was lazy    Sees Posey Pronto helps neck ha  zanaflex  Need  Refill ibu  And ocass alprazolam  hasn't had  Prev  "     Health Maintenance  Topic Date Due  . HIV Screening  07/21/1982  . INFLUENZA VACCINE  08/15/2017  . MAMMOGRAM  12/15/2017  . TETANUS/TDAP  05/16/2019  . PAP SMEAR  12/16/2019   Health Maintenance Review LIFESTYLE:  Exercise:   Some   Tobacco/ETS: not as much  1 pack per week  Alcohol:   None  Sugar beverages: Sleep:  About 6-7  Drug use: no HH of  2 pet dog o Work:  At least 50   Pills helped some  And   Walking some  But   [address] out of [employer] .    ROS:  GEN/ HEENT: No fever, significant weight changes sweats headaches vision problems hearing changes, CV/ PULM; No chest pain shortness of breath cough, syncope,edema  change in exercise tolerance. GI /GU: No adominal pain, vomiting, change in bowel habits. No blood in the stool. No significant GU symptoms. SKIN/HEME: ,no acute skin rashes suspicious lesions or bleeding. No lymphadenopathy, nodules, masses.  NEURO/ PSYCH:  No neurologic signs such as weakness numbness. No depression anxiety. IMM/ Allergy: No unusual infections.  Allergy .   REST of 12 system review negative except as per HPI   Past Medical History:  Diagnosis Date  . ADJ DISORDER WITH MIXED ANXIETY & DEPRESSED MOOD 12/14/2009   Qualifier: Diagnosis of  By: Regis Bill MD, Standley Brooking   . Allergic reaction 05/18/2011  . Anxiety   . GERD (gastroesophageal reflux disease)   . History of gallstones   . HTN  (hypertension)   . Metrorrhagia 12/14/2009   Qualifier: Diagnosis of  By: Regis Bill MD, Standley Brooking With pelvic pain  Sees Dr Philis Pique and tobotic hysterectomy scheduled   . Obesity   . TMJ (temporomandibular joint disorder)     Past Surgical History:  Procedure Laterality Date  . ABDOMINAL HYSTERECTOMY  09/2010   partial  . CESAREAN SECTION    . CHOLECYSTECTOMY  1989  . WISDOM TOOTH EXTRACTION  2010    Family History  Problem Relation Age of Onset  . Heart disease Mother 27       Died age 60, MI  . Heart attack Mother   . Hypertension Mother   . Hypertension Father        Living  . Diabetes Father   . Heart disease Father   . Other Sister        Killed, age 63  . Other Daughter        Killed, age 63  . Hyperlipidemia Neg Hx   . Sudden death Neg Hx     Social History   Socioeconomic History  . Marital status: Married    Spouse name: Not on file  .  Number of children: 1  . Years of education: Not on file  . Highest education level: Not on file  Occupational History  . Occupation: Best boy: [employer]  Social Needs  . Financial resource strain: Not on file  . Food insecurity:    Worry: Not on file    Inability: Not on file  . Transportation needs:    Medical: Not on file    Non-medical: Not on file  Tobacco Use  . Smoking status: Current Some Day Smoker    Packs/day: 0.25    Types: Cigarettes    Last attempt to quit: 01/22/2012    Years since quitting: 5.3  . Smokeless tobacco: Never Used  . Tobacco comment: 1-2 cigarretes daily  Substance and Sexual Activity  . Alcohol use: No    Alcohol/week: 0.0 oz  . Drug use: No  . Sexual activity: Not on file  Lifestyle  . Physical activity:    Days per week: Not on file    Minutes per session: Not on file  . Stress: Not on file  Relationships  . Social connections:    Talks on phone: Not on file    Gets together: Not on file    Attends religious service: Not on file    Active member of club or organization:  Not on file    Attends meetings of clubs or organizations: Not on file    Relationship status: Not on file  Other Topics Concern  . 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 of 2,     BF and dog,     G3P1,     Bereaved parent daughter murdered 2008,   Trial finished  Baldo Ash guilty this week march    No etoh,      No caffeine,     Minimal tobacco,       ETS     Got married feb 14     Outpatient Medications Prior to Visit  Medication Sig Dispense Refill  . Azelastine-Fluticasone (DYMISTA) 137-50 MCG/ACT SUSP Dymista 137 mcg-50 mcg/spray nasal spray    . cetirizine (ZYRTEC) 10 MG tablet Take 10 mg by mouth at bedtime.    . fexofenadine (ALLEGRA) 180 MG tablet Take 180 mg by mouth daily.    . hydrOXYzine (ATARAX/VISTARIL) 10 MG tablet Take 10 mg by mouth 3 (three) times daily as needed for itching.    . methocarbamol (ROBAXIN) 500 MG tablet Take 1 tablet (500 mg total) by mouth 2 (two) times daily as needed for muscle spasms (neck pain). 60 tablet 5  . naproxen (NAPROSYN) 375 MG tablet Take 375 mg by mouth daily as needed for mild pain.    . naproxen sodium (ALEVE) 220 MG tablet Take 440 mg by mouth daily as needed (pain).    Marland Kitchen omeprazole (PRILOSEC) 20 MG capsule TAKE 1 CAPSULE BY MOUTH EVERY DAY 90 capsule 0  . tiZANidine (ZANAFLEX) 2 MG tablet Take 1 tablet (2 mg total) by mouth at bedtime as needed for muscle spasms. 30 tablet 5  .  ALPRAZolam (XANAX) 0.5 MG tablet TAKE 1 TABLET BY MOUTH 3 TIMES A DAY AS NEEDED FOR ANXIETY 24 tablet 0  . ibuprofen (ADVIL,MOTRIN) 800 MG tablet TAKE 1 TABLET BY MOUTH EVERY 8 HOURS AS NEEDED FOR CRAMPS 30 tablet 2  . lisinopril-hydrochlorothiazide (PRINZIDE,ZESTORETIC) 20-12.5 MG tablet TAKE 1 TABLET  BY MOUTH EVERY DAY 90 tablet 0  . oxyCODONE-acetaminophen (PERCOCET/ROXICET) 5-325 MG tablet Take 1 tablet by mouth every 6 (six) hours as needed for severe pain. 10 tablet 0   No facility-administered medications prior to visit.      EXAM:  BP 138/86 (BP Location: Right Arm, Cuff Size: Large)   Pulse 75   Temp 98.2 F (36.8 C) (Oral)   Ht 5' 6.75" (1.695 m)   Wt 294 lb 4.8 oz (133.5 kg)   BMI 46.44 kg/m   Body mass index is 46.44 kg/m. Wt Readings from Last 3 Encounters:  06/12/17 294 lb 4.8 oz (133.5 kg)  01/28/17 283 lb (128.4 kg)  01/22/17 280 lb (127 kg)    Physical Exam: Vital signs reviewed IEP:PIRJ is a well-developed well-nourished alert cooperative    who appearsr stated age in no acute distress.  HEENT: normocephalic atraumatic , Eyes: PERRL EOM's full, conjunctiva clear, Nares: paten,t no deformity discharge or tenderness., Ears: no deformity EAC's clear TMs with normal landmarks. Mouth: clear OP, no lesions, edema.  Moist mucous membranes. Dentition in adequate repair. NECK: supple without masses, thyromegaly or bruits. CHEST/PULM:  Clear to auscultation and percussion breath sounds equal no wheeze , rales or rhonchi. No chest wall deformities or tenderness. Breast: normal by inspection . No dimpling, discharge, masses, tenderness or discharge . CV: PMI is nondisplaced, S1 S2 no gallops, murmurs, rubs. Peripheral pulses are full without delay.No JVD .  ABDOMEN: Bowel sounds normal nontender  No guard or rebound, no hepato splenomegal no CVA tenderness.  Extremtities:  No clubbing cyanosis or edema, no acute joint swelling or redness no focal atrophy achilles  Thickened and tender   NEURO:  Oriented x3, cranial nerves 3-12 appear to be intact, no obvious focal weakness,gait within normal limits no abnormal reflexes or asymmetrical SKIN: No acute rashes normal turgor, color, no bruising or petechiae. PSYCH: Oriented, good eye contact, no obvious depression anxiety,  cognition and judgment appear normal. LN: no cervical axillary inguinal adenopathy  Lab Results  Component Value Date   WBC 5.7 06/12/2017   HGB 14.4 06/12/2017   HCT 42.4 06/12/2017   PLT 319.0 06/12/2017   GLUCOSE 92 06/12/2017   CHOL 206 (H) 06/12/2017   TRIG 127.0 06/12/2017   HDL 35.40 (L) 06/12/2017   LDLCALC 145 (H) 06/12/2017   ALT 24 06/12/2017   AST 21 06/12/2017   NA 139 06/12/2017   K 4.1 06/12/2017   CL 99 06/12/2017   CREATININE 0.69 06/12/2017   BUN 17 06/12/2017   CO2 32 06/12/2017   TSH 0.88 06/12/2017   HGBA1C 6.4 06/12/2017   MICROALBUR <0.7 11/19/2014    BP Readings from Last 3 Encounters:  06/12/17 138/86  01/28/17 110/80  01/22/17 (!) 154/90   Wt Readings from Last 3 Encounters:  06/12/17 294 lb 4.8 oz (133.5 kg)  01/28/17 283 lb (128.4 kg)  01/22/17 280 lb (127 kg)      ASSESSMENT AND PLAN:  Discussed the following assessment and plan:  Visit for preventive health examination - Plan: Basic metabolic panel, CBC with Differential/Platelet, Hemoglobin A1c, Hepatic function panel, Lipid panel, TSH  Medication management - Plan: Basic metabolic  panel, CBC with Differential/Platelet, Hemoglobin A1c, Hepatic function panel, Lipid panel, TSH  Morbid obesity, unspecified obesity type ([place]) - Plan: Basic metabolic panel, CBC with Differential/Platelet, Hemoglobin A1c, Hepatic function panel, Lipid panel, TSH  Tobacco use  Essential hypertension - Plan: Basic metabolic panel, CBC with Differential/Platelet, Hemoglobin A1c, Hepatic function panel, Lipid panel, TSH  Hyperglycemia - Plan: Basic metabolic panel, CBC with Differential/Platelet, Hemoglobin A1c, Hepatic function panel, Lipid panel, TSH  Low HDL (under 40) - Plan: Basic metabolic panel, CBC with Differential/Platelet, Hemoglobin A1c, Hepatic function panel, Lipid panel, TSH  Morbid obesity (HCC) - Plan: Basic metabolic panel, CBC with Differential/Platelet, Hemoglobin A1c, Hepatic function  panel, Lipid panel, TSH intensify  bp med    Restart phentermine and lose weight in healthy manner  Patient Care Team: Keshun Berrett, Standley Brooking, MD as PCP - General Ladene Artist, MD as Attending Physician (Gastroenterology) Patient Instructions  Stop tobacco  Can get back on  Phentermine .   And lose weight in a healthy manner .   Will notify you  of labs when available.  Lets increase the  bp med to 20/25 mg lis hctz.    Caution with the alprazolam   Refilled today .    Plan ROV in 2 months or  Depending on labs    Health Maintenance, Female Adopting a healthy lifestyle and getting preventive care can go a long way to promote health and wellness. Talk with your health care provider about what schedule of regular examinations is right for you. This is a good chance for you to check in with your provider about disease prevention and staying healthy. In between checkups, there are plenty of things you can do on your own. Experts have done a lot of research about which lifestyle changes and preventive measures are most likely to keep you healthy. Ask your health care provider for more information. Weight and diet Eat a healthy diet  Be sure to include plenty of vegetables, fruits, low-fat dairy products, and lean protein.  Do not eat a lot of foods high in solid fats, added sugars, or salt.  Get regular exercise. This is one of the most important things you can do for your health. ? Most adults should exercise for at least 150 minutes each week. The exercise should increase your heart rate and make you sweat (moderate-intensity exercise). ? Most adults should also do strengthening exercises at least twice a week. This is in addition to the moderate-intensity exercise.  Maintain a healthy weight  Body mass index (BMI) is a measurement that can be used to identify possible weight problems. It estimates body fat based on height and weight. Your health care provider can help determine your  BMI and help you achieve or maintain a healthy weight.  For females 14 years of age and older: ? A BMI below 18.5 is considered underweight. ? A BMI of 18.5 to 24.9 is normal. ? A BMI of 25 to 29.9 is considered overweight. ? A BMI of 30 and above is considered obese.  Watch levels of cholesterol and blood lipids  You should start having your blood tested for lipids and cholesterol at 50 years of age, then have this test every 5 years.  You may need to have your cholesterol levels checked more often if: ? Your lipid or cholesterol levels are high. ? You are older than 50 years of age. ? You are at high risk for heart disease.  Cancer screening Lung Cancer  Lung  cancer screening is recommended for adults 2-9 years old who are at high risk for lung cancer because of a history of smoking.  A yearly low-dose CT scan of the lungs is recommended for people who: ? Currently smoke. ? Have quit within the past 15 years. ? Have at least a 30-pack-year history of smoking. A pack year is smoking an average of one pack of cigarettes a day for 1 year.  Yearly screening should continue until it has been 15 years since you quit.  Yearly screening should stop if you develop a health problem that would prevent you from having lung cancer treatment.  Breast Cancer  Practice breast self-awareness. This means understanding how your breasts normally appear and feel.  It also means doing regular breast self-exams. Let your health care provider know about any changes, no matter how small.  If you are in your 20s or 30s, you should have a clinical breast exam (CBE) by a health care provider every 1-3 years as part of a regular health exam.  If you are 53 or older, have a CBE every year. Also consider having a breast X-ray (mammogram) every year.  If you have a family history of breast cancer, talk to your health care provider about genetic screening.  If you are at high risk for breast cancer,  talk to your health care provider about having an MRI and a mammogram every year.  Breast cancer gene (BRCA) assessment is recommended for women who have family members with BRCA-related cancers. BRCA-related cancers include: ? Breast. ? Ovarian. ? Tubal. ? Peritoneal cancers.  Results of the assessment will determine the need for genetic counseling and BRCA1 and BRCA2 testing.  Cervical Cancer Your health care provider may recommend that you be screened regularly for cancer of the pelvic organs (ovaries, uterus, and vagina). This screening involves a pelvic examination, including checking for microscopic changes to the surface of your cervix (Pap test). You may be encouraged to have this screening done every 3 years, beginning at age 74.  For women ages 76-65, health care providers may recommend pelvic exams and Pap testing every 3 years, or they may recommend the Pap and pelvic exam, combined with testing for human papilloma virus (HPV), every 5 years. Some types of HPV increase your risk of cervical cancer. Testing for HPV may also be done on women of any age with unclear Pap test results.  Other health care providers may not recommend any screening for nonpregnant women who are considered low risk for pelvic cancer and who do not have symptoms. Ask your health care provider if a screening pelvic exam is right for you.  If you have had past treatment for cervical cancer or a condition that could lead to cancer, you need Pap tests and screening for cancer for at least 20 years after your treatment. If Pap tests have been discontinued, your risk factors (such as having a new sexual partner) need to be reassessed to determine if screening should resume. Some women have medical problems that increase the chance of getting cervical cancer. In these cases, your health care provider may recommend more frequent screening and Pap tests.  Colorectal Cancer  This type of cancer can be detected and often  prevented.  Routine colorectal cancer screening usually begins at 50 years of age and continues through 51 years of age.  Your health care provider may recommend screening at an earlier age if you have risk factors for colon cancer.  Your health  care provider may also recommend using home test kits to check for hidden blood in the stool.  A small camera at the end of a tube can be used to examine your colon directly (sigmoidoscopy or colonoscopy). This is done to check for the earliest forms of colorectal cancer.  Routine screening usually begins at age 7.  Direct examination of the colon should be repeated every 5-10 years through 50 years of age. However, you may need to be screened more often if early forms of precancerous polyps or small growths are found.  Skin Cancer  Check your skin from head to toe regularly.  Tell your health care provider about any new moles or changes in moles, especially if there is a change in a mole's shape or color.  Also tell your health care provider if you have a mole that is larger than the size of a pencil eraser.  Always use sunscreen. Apply sunscreen liberally and repeatedly throughout the day.  Protect yourself by wearing long sleeves, pants, a wide-brimmed hat, and sunglasses whenever you are outside.  Heart disease, diabetes, and high blood pressure  High blood pressure causes heart disease and increases the risk of stroke. High blood pressure is more likely to develop in: ? People who have blood pressure in the high end of the normal range (130-139/85-89 mm Hg). ? People who are overweight or obese. ? People who are African American.  If you are 60-79 years of age, have your blood pressure checked every 3-5 years. If you are 80 years of age or older, have your blood pressure checked every year. You should have your blood pressure measured twice-once when you are at a hospital or clinic, and once when you are not at a hospital or clinic.  Record the average of the two measurements. To check your blood pressure when you are not at a hospital or clinic, you can use: ? An automated blood pressure machine at a pharmacy. ? A home blood pressure monitor.  If you are between 4 years and 69 years old, ask your health care provider if you should take aspirin to prevent strokes.  Have regular diabetes screenings. This involves taking a blood sample to check your fasting blood sugar level. ? If you are at a normal weight and have a low risk for diabetes, have this test once every three years after 50 years of age. ? If you are overweight and have a high risk for diabetes, consider being tested at a younger age or more often. Preventing infection Hepatitis B  If you have a higher risk for hepatitis B, you should be screened for this virus. You are considered at high risk for hepatitis B if: ? You were born in a country where hepatitis B is common. Ask your health care provider which countries are considered high risk. ? Your parents were born in a high-risk country, and you have not been immunized against hepatitis B (hepatitis B vaccine). ? You have HIV or AIDS. ? You use needles to inject street drugs. ? You live with someone who has hepatitis B. ? You have had sex with someone who has hepatitis B. ? You get hemodialysis treatment. ? You take certain medicines for conditions, including cancer, organ transplantation, and autoimmune conditions.  Hepatitis C  Blood testing is recommended for: ? Everyone born from [birth year] through 1963. ? Anyone with known risk factors for hepatitis C.  Sexually transmitted infections (STIs)  You should be screened for sexually transmitted  infections (STIs) including gonorrhea and chlamydia if: ? You are sexually active and are younger than 51 years of age. ? You are older than 50 years of age and your health care provider tells you that you are at risk for this type of infection. ? Your sexual  activity has changed since you were last screened and you are at an increased risk for chlamydia or gonorrhea. Ask your health care provider if you are at risk.  If you do not have HIV, but are at risk, it may be recommended that you take a prescription medicine daily to prevent HIV infection. This is called pre-exposure prophylaxis (PrEP). You are considered at risk if: ? You are sexually active and do not regularly use condoms or know the HIV status of your partner(s). ? You take drugs by injection. ? You are sexually active with a partner who has HIV.  Talk with your health care provider about whether you are at high risk of being infected with HIV. If you choose to begin PrEP, you should first be tested for HIV. You should then be tested every 3 months for as long as you are taking PrEP. Pregnancy  If you are premenopausal and you may become pregnant, ask your health care provider about preconception counseling.  If you may become pregnant, take 400 to 800 micrograms (mcg) of folic acid every day.  If you want to prevent pregnancy, talk to your health care provider about birth control (contraception). Osteoporosis and menopause  Osteoporosis is a disease in which the bones lose minerals and strength with aging. This can result in serious bone fractures. Your risk for osteoporosis can be identified using a bone density scan.  If you are 60 years of age or older, or if you are at risk for osteoporosis and fractures, ask your health care provider if you should be screened.  Ask your health care provider whether you should take a calcium or vitamin D supplement to lower your risk for osteoporosis.  Menopause may have certain physical symptoms and risks.  Hormone replacement therapy may reduce some of these symptoms and risks. Talk to your health care provider about whether hormone replacement therapy is right for you. Follow these instructions at home:  Schedule regular health, dental,  and eye exams.  Stay current with your immunizations.  Do not use any tobacco products including cigarettes, chewing tobacco, or electronic cigarettes.  If you are pregnant, do not drink alcohol.  If you are breastfeeding, limit how much and how often you drink alcohol.  Limit alcohol intake to no more than 1 drink per day for nonpregnant women. One drink equals 12 ounces of beer, 5 ounces of wine, or 1 ounces of hard liquor.  Do not use street drugs.  Do not share needles.  Ask your health care provider for help if you need support or information about quitting drugs.  Tell your health care provider if you often feel depressed.  Tell your health care provider if you have ever been abused or do not feel safe at home. This information is not intended to replace advice given to you by your health care provider. Make sure you discuss any questions you have with your health care provider. Document Released: 07/17/2010 Document Revised: 06/09/2015 Document Reviewed: 10/05/2014 Elsevier Interactive Patient Education  2018 [employer]. Mickael Mcnutt M.D.

## 2017-06-12 ENCOUNTER — Ambulatory Visit (INDEPENDENT_AMBULATORY_CARE_PROVIDER_SITE_OTHER): Payer: BLUE CROSS/BLUE SHIELD | Admitting: Internal Medicine

## 2017-06-12 ENCOUNTER — Encounter: Payer: Self-pay | Admitting: Internal Medicine

## 2017-06-12 VITALS — BP 138/86 | HR 75 | Temp 98.2°F | Ht 66.75 in | Wt 294.3 lb

## 2017-06-12 DIAGNOSIS — Z79899 Other long term (current) drug therapy: Secondary | ICD-10-CM | POA: Diagnosis not present

## 2017-06-12 DIAGNOSIS — Z0000 Encounter for general adult medical examination without abnormal findings: Secondary | ICD-10-CM | POA: Diagnosis not present

## 2017-06-12 DIAGNOSIS — E786 Lipoprotein deficiency: Secondary | ICD-10-CM

## 2017-06-12 DIAGNOSIS — Z720 Tobacco use: Secondary | ICD-10-CM

## 2017-06-12 DIAGNOSIS — R739 Hyperglycemia, unspecified: Secondary | ICD-10-CM | POA: Diagnosis not present

## 2017-06-12 DIAGNOSIS — I10 Essential (primary) hypertension: Secondary | ICD-10-CM

## 2017-06-12 LAB — HEPATIC FUNCTION PANEL
ALK PHOS: 78 U/L (ref 39–117)
ALT: 24 U/L (ref 0–35)
AST: 21 U/L (ref 0–37)
Albumin: 4.6 g/dL (ref 3.5–5.2)
Bilirubin, Direct: 0.1 mg/dL (ref 0.0–0.3)
TOTAL PROTEIN: 8.1 g/dL (ref 6.0–8.3)
Total Bilirubin: 0.6 mg/dL (ref 0.2–1.2)

## 2017-06-12 LAB — CBC WITH DIFFERENTIAL/PLATELET
BASOS PCT: 0.7 % (ref 0.0–3.0)
Basophils Absolute: 0 10*3/uL (ref 0.0–0.1)
EOS PCT: 1.6 % (ref 0.0–5.0)
Eosinophils Absolute: 0.1 10*3/uL (ref 0.0–0.7)
HEMATOCRIT: 42.4 % (ref 36.0–46.0)
HEMOGLOBIN: 14.4 g/dL (ref 12.0–15.0)
Lymphocytes Relative: 45.8 % (ref 12.0–46.0)
Lymphs Abs: 2.6 10*3/uL (ref 0.7–4.0)
MCHC: 33.9 g/dL (ref 30.0–36.0)
MCV: 87.7 fl (ref 78.0–100.0)
MONOS PCT: 5.3 % (ref 3.0–12.0)
Monocytes Absolute: 0.3 10*3/uL (ref 0.1–1.0)
Neutro Abs: 2.6 10*3/uL (ref 1.4–7.7)
Neutrophils Relative %: 46.6 % (ref 43.0–77.0)
Platelets: 319 10*3/uL (ref 150.0–400.0)
RBC: 4.84 Mil/uL (ref 3.87–5.11)
RDW: 14.7 % (ref 11.5–15.5)
WBC: 5.7 10*3/uL (ref 4.0–10.5)

## 2017-06-12 LAB — LIPID PANEL
Cholesterol: 206 mg/dL — ABNORMAL HIGH (ref 0–200)
HDL: 35.4 mg/dL — ABNORMAL LOW (ref >39.00)
LDL Cholesterol: 145 mg/dL — ABNORMAL HIGH (ref 0–99)
NONHDL: 170.1
Total CHOL/HDL Ratio: 6
Triglycerides: 127 mg/dL (ref 0.0–149.0)
VLDL: 25.4 mg/dL (ref 0.0–40.0)

## 2017-06-12 LAB — BASIC METABOLIC PANEL
BUN: 17 mg/dL (ref 6–23)
CHLORIDE: 99 meq/L (ref 96–112)
CO2: 32 mEq/L (ref 19–32)
Calcium: 9.8 mg/dL (ref 8.4–10.5)
Creatinine, Ser: 0.69 mg/dL (ref 0.40–1.20)
GFR: 115.87 mL/min (ref >60.00)
Glucose, Bld: 92 mg/dL (ref 70–99)
POTASSIUM: 4.1 meq/L (ref 3.5–5.1)
SODIUM: 139 meq/L (ref 135–145)

## 2017-06-12 LAB — TSH: TSH: 0.88 u[IU]/mL (ref 0.35–4.50)

## 2017-06-12 LAB — HEMOGLOBIN A1C: Hgb A1c MFr Bld: 6.4 % (ref 4.6–6.5)

## 2017-06-12 MED ORDER — ALPRAZOLAM 0.5 MG PO TABS: ORAL_TABLET | ORAL | 0 refills | Status: DC

## 2017-06-12 MED ORDER — LISINOPRIL-HYDROCHLOROTHIAZIDE 20-25 MG PO TABS: 1.0000 | ORAL_TABLET | Freq: Every day | ORAL | 3 refills | Status: DC

## 2017-06-12 MED ORDER — PHENTERMINE HCL 30 MG PO CAPS: 30.0000 mg | ORAL_CAPSULE | ORAL | 1 refills | Status: DC

## 2017-06-12 MED ORDER — IBUPROFEN 800 MG PO TABS: ORAL_TABLET | ORAL | 2 refills | Status: DC

## 2017-06-12 NOTE — Patient Instructions (Addendum)
Stop tobacco  Can get back on  Phentermine .   And lose weight in a healthy manner .   Will notify you  of labs when available.  Lets increase the  bp med to 20/25 mg lis hctz.    Caution with the alprazolam   Refilled today .    Plan ROV in 2 months or  Depending on labs    Health Maintenance, Female Adopting a healthy lifestyle and getting preventive care can go a long way to promote health and wellness. Talk with your health care provider about what schedule of regular examinations is right for you. This is a good chance for you to check in with your provider about disease prevention and staying healthy. In between checkups, there are plenty of things you can do on your own. Experts have done a lot of research about which lifestyle changes and preventive measures are most likely to keep you healthy. Ask your health care provider for more information. Weight and diet Eat a healthy diet  Be sure to include plenty of vegetables, fruits, low-fat dairy products, and lean protein.  Do not eat a lot of foods high in solid fats, added sugars, or salt.  Get regular exercise. This is one of the most important things you can do for your health. ? Most adults should exercise for at least 150 minutes each week. The exercise should increase your heart rate and make you sweat (moderate-intensity exercise). ? Most adults should also do strengthening exercises at least twice a week. This is in addition to the moderate-intensity exercise.  Maintain a healthy weight  Body mass index (BMI) is a measurement that can be used to identify possible weight problems. It estimates body fat based on height and weight. Your health care provider can help determine your BMI and help you achieve or maintain a healthy weight.  For females 81 years of age and older: ? A BMI below 18.5 is considered underweight. ? A BMI of 18.5 to 24.9 is normal. ? A BMI of 25 to 29.9 is considered overweight. ? A BMI of 30 and  above is considered obese.  Watch levels of cholesterol and blood lipids  You should start having your blood tested for lipids and cholesterol at 50 years of age, then have this test every 5 years.  You may need to have your cholesterol levels checked more often if: ? Your lipid or cholesterol levels are high. ? You are older than 50 years of age. ? You are at high risk for heart disease.  Cancer screening Lung Cancer  Lung cancer screening is recommended for adults 59-51 years old who are at high risk for lung cancer because of a history of smoking.  A yearly low-dose CT scan of the lungs is recommended for people who: ? Currently smoke. ? Have quit within the past 15 years. ? Have at least a 30-pack-year history of smoking. A pack year is smoking an average of one pack of cigarettes a day for 1 year.  Yearly screening should continue until it has been 15 years since you quit.  Yearly screening should stop if you develop a health problem that would prevent you from having lung cancer treatment.  Breast Cancer  Practice breast self-awareness. This means understanding how your breasts normally appear and feel.  It also means doing regular breast self-exams. Let your health care provider know about any changes, no matter how small.  If you are in your 20s or 30s,  you should have a clinical breast exam (CBE) by a health care provider every 1-3 years as part of a regular health exam.  If you are 29 or older, have a CBE every year. Also consider having a breast X-ray (mammogram) every year.  If you have a family history of breast cancer, talk to your health care provider about genetic screening.  If you are at high risk for breast cancer, talk to your health care provider about having an MRI and a mammogram every year.  Breast cancer gene (BRCA) assessment is recommended for women who have family members with BRCA-related cancers. BRCA-related cancers  include: ? Breast. ? Ovarian. ? Tubal. ? Peritoneal cancers.  Results of the assessment will determine the need for genetic counseling and BRCA1 and BRCA2 testing.  Cervical Cancer Your health care provider may recommend that you be screened regularly for cancer of the pelvic organs (ovaries, uterus, and vagina). This screening involves a pelvic examination, including checking for microscopic changes to the surface of your cervix (Pap test). You may be encouraged to have this screening done every 3 years, beginning at age 2.  For women ages 18-65, health care providers may recommend pelvic exams and Pap testing every 3 years, or they may recommend the Pap and pelvic exam, combined with testing for human papilloma virus (HPV), every 5 years. Some types of HPV increase your risk of cervical cancer. Testing for HPV may also be done on women of any age with unclear Pap test results.  Other health care providers may not recommend any screening for nonpregnant women who are considered low risk for pelvic cancer and who do not have symptoms. Ask your health care provider if a screening pelvic exam is right for you.  If you have had past treatment for cervical cancer or a condition that could lead to cancer, you need Pap tests and screening for cancer for at least 20 years after your treatment. If Pap tests have been discontinued, your risk factors (such as having a new sexual partner) need to be reassessed to determine if screening should resume. Some women have medical problems that increase the chance of getting cervical cancer. In these cases, your health care provider may recommend more frequent screening and Pap tests.  Colorectal Cancer  This type of cancer can be detected and often prevented.  Routine colorectal cancer screening usually begins at 50 years of age and continues through 50 years of age.  Your health care provider may recommend screening at an earlier age if you have risk factors  for colon cancer.  Your health care provider may also recommend using home test kits to check for hidden blood in the stool.  A small camera at the end of a tube can be used to examine your colon directly (sigmoidoscopy or colonoscopy). This is done to check for the earliest forms of colorectal cancer.  Routine screening usually begins at age 47.  Direct examination of the colon should be repeated every 5-10 years through 50 years of age. However, you may need to be screened more often if early forms of precancerous polyps or small growths are found.  Skin Cancer  Check your skin from head to toe regularly.  Tell your health care provider about any new moles or changes in moles, especially if there is a change in a mole's shape or color.  Also tell your health care provider if you have a mole that is larger than the size of a pencil eraser.  Always use sunscreen. Apply sunscreen liberally and repeatedly throughout the day.  Protect yourself by wearing long sleeves, pants, a wide-brimmed hat, and sunglasses whenever you are outside.  Heart disease, diabetes, and high blood pressure  High blood pressure causes heart disease and increases the risk of stroke. High blood pressure is more likely to develop in: ? People who have blood pressure in the high end of the normal range (130-139/85-89 mm Hg). ? People who are overweight or obese. ? People who are African American.  If you are 54-75 years of age, have your blood pressure checked every 3-5 years. If you are 36 years of age or older, have your blood pressure checked every year. You should have your blood pressure measured twice-once when you are at a hospital or clinic, and once when you are not at a hospital or clinic. Record the average of the two measurements. To check your blood pressure when you are not at a hospital or clinic, you can use: ? An automated blood pressure machine at a pharmacy. ? A home blood pressure monitor.  If  you are between 36 years and 38 years old, ask your health care provider if you should take aspirin to prevent strokes.  Have regular diabetes screenings. This involves taking a blood sample to check your fasting blood sugar level. ? If you are at a normal weight and have a low risk for diabetes, have this test once every three years after 50 years of age. ? If you are overweight and have a high risk for diabetes, consider being tested at a younger age or more often. Preventing infection Hepatitis B  If you have a higher risk for hepatitis B, you should be screened for this virus. You are considered at high risk for hepatitis B if: ? You were born in a country where hepatitis B is common. Ask your health care provider which countries are considered high risk. ? Your parents were born in a high-risk country, and you have not been immunized against hepatitis B (hepatitis B vaccine). ? You have HIV or AIDS. ? You use needles to inject street drugs. ? You live with someone who has hepatitis B. ? You have had sex with someone who has hepatitis B. ? You get hemodialysis treatment. ? You take certain medicines for conditions, including cancer, organ transplantation, and autoimmune conditions.  Hepatitis C  Blood testing is recommended for: ? Everyone born from [birth year] through 1963. ? Anyone with known risk factors for hepatitis C.  Sexually transmitted infections (STIs)  You should be screened for sexually transmitted infections (STIs) including gonorrhea and chlamydia if: ? You are sexually active and are younger than 50 years of age. ? You are older than 50 years of age and your health care provider tells you that you are at risk for this type of infection. ? Your sexual activity has changed since you were last screened and you are at an increased risk for chlamydia or gonorrhea. Ask your health care provider if you are at risk.  If you do not have HIV, but are at risk, it may be recommended  that you take a prescription medicine daily to prevent HIV infection. This is called pre-exposure prophylaxis (PrEP). You are considered at risk if: ? You are sexually active and do not regularly use condoms or know the HIV status of your partner(s). ? You take drugs by injection. ? You are sexually active with a partner who has HIV.  Talk with  your health care provider about whether you are at high risk of being infected with HIV. If you choose to begin PrEP, you should first be tested for HIV. You should then be tested every 3 months for as long as you are taking PrEP. Pregnancy  If you are premenopausal and you may become pregnant, ask your health care provider about preconception counseling.  If you may become pregnant, take 400 to 800 micrograms (mcg) of folic acid every day.  If you want to prevent pregnancy, talk to your health care provider about birth control (contraception). Osteoporosis and menopause  Osteoporosis is a disease in which the bones lose minerals and strength with aging. This can result in serious bone fractures. Your risk for osteoporosis can be identified using a bone density scan.  If you are 6 years of age or older, or if you are at risk for osteoporosis and fractures, ask your health care provider if you should be screened.  Ask your health care provider whether you should take a calcium or vitamin D supplement to lower your risk for osteoporosis.  Menopause may have certain physical symptoms and risks.  Hormone replacement therapy may reduce some of these symptoms and risks. Talk to your health care provider about whether hormone replacement therapy is right for you. Follow these instructions at home:  Schedule regular health, dental, and eye exams.  Stay current with your immunizations.  Do not use any tobacco products including cigarettes, chewing tobacco, or electronic cigarettes.  If you are pregnant, do not drink alcohol.  If you are  breastfeeding, limit how much and how often you drink alcohol.  Limit alcohol intake to no more than 1 drink per day for nonpregnant women. One drink equals 12 ounces of beer, 5 ounces of wine, or 1 ounces of hard liquor.  Do not use street drugs.  Do not share needles.  Ask your health care provider for help if you need support or information about quitting drugs.  Tell your health care provider if you often feel depressed.  Tell your health care provider if you have ever been abused or do not feel safe at home. This information is not intended to replace advice given to you by your health care provider. Make sure you discuss any questions you have with your health care provider. Document Released: 07/17/2010 Document Revised: 06/09/2015 Document Reviewed: 10/05/2014 Elsevier Interactive Patient Education  [employer].

## 2017-07-12 ENCOUNTER — Ambulatory Visit: Payer: BLUE CROSS/BLUE SHIELD | Admitting: Neurology

## 2017-07-29 ENCOUNTER — Ambulatory Visit: Payer: BLUE CROSS/BLUE SHIELD | Admitting: Neurology

## 2017-08-14 NOTE — Progress Notes (Signed)
Chief Complaint  Patient presents with  . Follow-up    Pt has lost 14lb since last OV! Pt has no new concerns.     HPI: Beth Wallace 52 y.o. come in for Chronic med management  Since last visit phentermine helping the snacking still doing weight watcher  No sig se of med  Has taste  in mouth.    Weight:  Almost 15 pounds  and on  Phentermine .   Ride bike feeling pretty   good   Lot more fruit and salad  Not snacking .  Marland Kitchen ROS: See pertinent positives and negatives per HPI. No cp sob  Som pms sx   Past Medical History:  Diagnosis Date  . ADJ DISORDER WITH MIXED ANXIETY & DEPRESSED MOOD 12/14/2009   Qualifier: Diagnosis of  By: Regis Bill MD, Standley Brooking   . Allergic reaction 05/18/2011  . Anxiety   . GERD (gastroesophageal reflux disease)   . History of gallstones   . HTN (hypertension)   . Metrorrhagia 12/14/2009   Qualifier: Diagnosis of  By: Regis Bill MD, Standley Brooking With pelvic pain  Sees Dr Philis Pique and tobotic hysterectomy scheduled   . Obesity   . TMJ (temporomandibular joint disorder)     Family History  Problem Relation Age of Onset  . Heart disease Mother 8       Died age 35, MI  . Heart attack Mother   . Hypertension Mother   . Hypertension Father        Living  . Diabetes Father   . Heart disease Father   . Other Sister        Killed, age 60  . Other Daughter        Killed, age 89  . Hyperlipidemia Neg Hx   . Sudden death Neg Hx     Social History   Socioeconomic History  . Marital status: Married    Spouse name: Not on file  . Number of children: 1  . Years of education: Not on file  . Highest education level: Not on file  Occupational History  . Occupation: Best boy: [employer]  Social Needs  . Financial resource strain: Not on file  . Food insecurity:    Worry: Not on file    Inability: Not on file  . Transportation needs:    Medical: Not on file    Non-medical: Not on file  Tobacco Use  . Smoking status: Current Some Day Smoker   Packs/day: 0.25    Types: Cigarettes    Last attempt to quit: 01/22/2012    Years since quitting: 5.5  . Smokeless tobacco: Never Used  . Tobacco comment: 1-2 cigarretes daily  Substance and Sexual Activity  . Alcohol use: No    Alcohol/week: 0.0 oz  . Drug use: No  . Sexual activity: Not on file  Lifestyle  . Physical activity:    Days per week: Not on file    Minutes per session: Not on file  . Stress: Not on file  Relationships  . Social connections:    Talks on phone: Not on file    Gets together: Not on file    Attends religious service: Not on file    Active member of club or organization: Not on file    Attends meetings of clubs or organizations: Not on file    Relationship status: Not on file  Other Topics Concern  . 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 ALPRAZolam (XANAX) 0.5 MG tablet TAKE 1 TABLET BY MOUTH 3 TIMES A DAY AS NEEDED FOR ANXIETY.avoid regular use 24 tablet 0  . Azelastine-Fluticasone (DYMISTA) 137-50 MCG/ACT SUSP Dymista 137 mcg-50 mcg/spray nasal spray    . cetirizine (ZYRTEC) 10 MG tablet Take 10 mg by mouth at bedtime.    . fexofenadine (ALLEGRA) 180 MG tablet Take 180 mg by mouth daily.    . hydrOXYzine (ATARAX/VISTARIL) 10 MG tablet Take 10 mg by mouth 3 (three) times daily as needed for itching.    Marland Kitchen ibuprofen (ADVIL,MOTRIN) 800 MG tablet TAKE 1 TABLET BY MOUTH EVERY 8 HOURS AS NEEDED FOR CRAMPS 30 tablet  2  . lisinopril-hydrochlorothiazide (PRINZIDE,ZESTORETIC) 20-25 MG tablet Take 1 tablet by mouth daily. 90 tablet 3  . methocarbamol (ROBAXIN) 500 MG tablet Take 1 tablet (500 mg total) by mouth 2 (two) times daily as needed for muscle spasms (neck pain). 60 tablet 5  . naproxen (NAPROSYN) 375 MG tablet Take 375 mg by mouth daily as needed for mild pain.    . naproxen sodium (ALEVE) 220 MG tablet Take 440 mg by mouth daily as needed (pain).    Marland Kitchen omeprazole (PRILOSEC) 20 MG capsule TAKE 1 CAPSULE BY MOUTH EVERY DAY 90 capsule 0  . tiZANidine (ZANAFLEX) 2 MG tablet Take 1 tablet (2 mg total) by mouth at bedtime as needed for muscle spasms. 30 tablet 5  . phentermine 30 MG capsule Take 1 capsule (30 mg total) by mouth every morning. 30 capsule 1   No facility-administered medications prior to visit.      EXAM:  BP 128/78 (BP Location: Right Arm, Cuff Size: Large)   Pulse 74   Temp 98.2 F (36.8 C) (Oral)   Wt 280 lb 6.4 oz (127.2 kg)   BMI 44.25 kg/m   Body mass index is 44.25 kg/m.  GENERAL: vitals reviewed and listed above, alert, oriented, appears well hydrated and in no acute distress HEENT: atraumatic, conjunctiva  clear, no obvious abnormalities on inspection of external nose and ears PSYCH: pleasant and cooperative, no obvious depression or anxiety Lab Results  Component Value Date   WBC 5.7 06/12/2017   HGB 14.4 06/12/2017   HCT 42.4 06/12/2017   PLT 319.0 06/12/2017   GLUCOSE 92 06/12/2017   CHOL 206 (H) 06/12/2017   TRIG 127.0 06/12/2017   HDL 35.40 (L) 06/12/2017   LDLCALC 145 (H) 06/12/2017   ALT 24 06/12/2017   AST 21 06/12/2017   NA 139 06/12/2017   K 4.1 06/12/2017   CL 99 06/12/2017   CREATININE 0.69 06/12/2017  BUN 17 06/12/2017   CO2 32 06/12/2017   TSH 0.88 06/12/2017   HGBA1C 6.4 06/12/2017   MICROALBUR <0.7 11/19/2014   BP Readings from Last 3 Encounters:  08/15/17 128/78  06/12/17 138/86  01/28/17 110/80   Wt Readings from Last 3 Encounters:   08/15/17 280 lb 6.4 oz (127.2 kg)  06/12/17 294 lb 4.8 oz (133.5 kg)  01/28/17 283 lb (128.4 kg)     ASSESSMENT AND PLAN:  Discussed the following assessment and plan:  BMI 40.0-44.9, adult (HCC)  Medication management  Essential hypertension  Vitamin D deficiency  Encounter for weight management Plan  Lipid a1c and  Vit d?  At next visit   Or as appropriate   Success continue  Plan  .  Stay on bp meds  -Patient advised to return or notify health care team  if  new concerns arise.  Patient Instructions  Continue lifestyle intervention healthy eating and exercise .  Medication.  Continue   ROV in about 3 months and labs at the visit   LIPIDS and A1c  [facility] fasting if you can.   Vit d 1000 iu per day          Hallis Meditz K. Camber Ninh M.D.

## 2017-08-15 ENCOUNTER — Encounter: Payer: Self-pay | Admitting: Internal Medicine

## 2017-08-15 ENCOUNTER — Ambulatory Visit (INDEPENDENT_AMBULATORY_CARE_PROVIDER_SITE_OTHER): Payer: BLUE CROSS/BLUE SHIELD | Admitting: Internal Medicine

## 2017-08-15 VITALS — BP 128/78 | HR 74 | Temp 98.2°F | Wt 280.4 lb

## 2017-08-15 DIAGNOSIS — Z7689 Persons encountering health services in other specified circumstances: Secondary | ICD-10-CM | POA: Diagnosis not present

## 2017-08-15 DIAGNOSIS — I10 Essential (primary) hypertension: Secondary | ICD-10-CM

## 2017-08-15 DIAGNOSIS — Z6841 Body Mass Index (BMI) 40.0 and over, adult: Secondary | ICD-10-CM | POA: Diagnosis not present

## 2017-08-15 DIAGNOSIS — E559 Vitamin D deficiency, unspecified: Secondary | ICD-10-CM

## 2017-08-15 DIAGNOSIS — Z79899 Other long term (current) drug therapy: Secondary | ICD-10-CM

## 2017-08-15 MED ORDER — PHENTERMINE HCL 30 MG PO CAPS: 30.0000 mg | ORAL_CAPSULE | ORAL | 2 refills | Status: DC

## 2017-08-15 NOTE — Patient Instructions (Addendum)
Continue lifestyle intervention healthy eating and exercise .  Medication.  Continue   ROV in about 3 months and labs at the visit   LIPIDS and A1c  [facility] fasting if you can.   Vit d 1000 iu per day

## 2017-08-16 ENCOUNTER — Other Ambulatory Visit: Payer: Self-pay | Admitting: Internal Medicine

## 2017-08-23 ENCOUNTER — Ambulatory Visit: Payer: BLUE CROSS/BLUE SHIELD | Admitting: Neurology

## 2017-08-27 ENCOUNTER — Other Ambulatory Visit: Payer: Self-pay | Admitting: Internal Medicine

## 2017-09-06 ENCOUNTER — Other Ambulatory Visit: Payer: Self-pay | Admitting: Internal Medicine

## 2017-09-22 ENCOUNTER — Other Ambulatory Visit: Payer: Self-pay | Admitting: Neurology

## 2017-11-13 NOTE — Progress Notes (Deleted)
No chief complaint on file.   HPI: Beth Wallace 52 y.o. come in for Chronic disease management   BMI 40.0-44.9, adult ([place])  Medication management  Essential hypertension  Vitamin D deficiency  Encounter for weight management Plan  Lipid a1c and  Vit d?  At next visit   Or as appropriate   Success continue  Plan  .  Stay on bp meds  -Patient advised to return or notify health care team  if  new concerns arise.  Patient Instructions  Continue lifestyle intervention healthy eating and exercise .  Medication.  Continue   ROV in about 3 months and labs at the visit   LIPIDS and A1c  [facility] fasting if you can.   Vit d 1000 iu per day   ROS: See pertinent positives and negatives per HPI.  Past Medical History:  Diagnosis Date  . ADJ DISORDER WITH MIXED ANXIETY & DEPRESSED MOOD 12/14/2009   Qualifier: Diagnosis of  By: Regis Bill MD, Standley Brooking   . Allergic reaction 05/18/2011  . Anxiety   . GERD (gastroesophageal reflux disease)   . History of gallstones   . HTN (hypertension)   . Metrorrhagia 12/14/2009   Qualifier: Diagnosis of  By: Regis Bill MD, Standley Brooking With pelvic pain  Sees Dr Philis Pique and tobotic hysterectomy scheduled   . Obesity   . Rapid palpitations 01/23/2012   Probably related to stress had negative cardiac workup in the past rule out metabolic   . TMJ (temporomandibular joint disorder)     Family History  Problem Relation Age of Onset  . Heart disease Mother 68       Died age 88, MI  . Heart attack Mother   . Hypertension Mother   . Hypertension Father        Living  . Diabetes Father   . Heart disease Father   . Other Sister        Killed, age 8  . Other Daughter        Killed, age 47  . Hyperlipidemia Neg Hx   . Sudden death Neg Hx     Social History   Socioeconomic History  . Marital status: Married    Spouse name: Not on file  . Number of children: 1  . Years of education: Not on file  . Highest education level: Not on file    Occupational History  . Occupation: Best boy: [employer]  Social Needs  . Financial resource strain: Not on file  . Food insecurity:    Worry: Not on file    Inability: Not on file  . Transportation needs:    Medical: Not on file    Non-medical: Not on file  Tobacco Use  . Smoking status: Current Some Day Smoker    Packs/day: 0.25    Types: Cigarettes    Last attempt to quit: 01/22/2012    Years since quitting: 5.8  . Smokeless tobacco: Never Used  . Tobacco comment: 1-2 cigarretes daily  Substance and Sexual Activity  . Alcohol use: No    Alcohol/week: 0.0 standard drinks  . Drug use: No  . Sexual activity: Not on file  Lifestyle  . Physical activity:    Days per week: Not on file    Minutes per session: Not on file  . Stress: Not on file  Relationships  . Social connections:    Talks on phone: Not on file    Gets together: Not on file  Attends religious service: Not on file    Active member of club or organization: Not on file    Attends meetings of clubs or organizations: Not on file    Relationship status: Not on file  Other Topics Concern  . 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 ALPRAZolam (XANAX) 0.5 MG tablet TAKE 1 TABLET BY MOUTH 3 TIMES A DAY AS NEEDED FOR ANXIETY.avoid regular use 24 tablet 0  . Azelastine-Fluticasone (DYMISTA) 137-50 MCG/ACT SUSP Dymista 137 mcg-50 mcg/spray nasal spray    . cetirizine  (ZYRTEC) 10 MG tablet Take 10 mg by mouth at bedtime.    . fexofenadine (ALLEGRA) 180 MG tablet Take 180 mg by mouth daily.    . hydrOXYzine (ATARAX/VISTARIL) 10 MG tablet Take 10 mg by mouth 3 (three) times daily as needed for itching.    Marland Kitchen ibuprofen (ADVIL,MOTRIN) 800 MG tablet TAKE 1 TABLET BY MOUTH EVERY 8 HOURS AS NEEDED FOR CRAMPS 30 tablet 2  . lisinopril-hydrochlorothiazide (PRINZIDE,ZESTORETIC) 20-25 MG tablet Take 1 tablet by mouth daily. 90 tablet 3  . methocarbamol (ROBAXIN) 500 MG tablet Take 1 tablet (500 mg total) by mouth 2 (two) times daily as needed for muscle spasms (neck pain). 60 tablet 5  . naproxen (NAPROSYN) 375 MG tablet Take 375 mg by mouth daily as needed for mild pain.    . naproxen sodium (ALEVE) 220 MG tablet Take 440 mg by mouth daily as needed (pain).    Marland Kitchen omeprazole (PRILOSEC) 20 MG capsule TAKE 1 CAPSULE BY MOUTH EVERY DAY 90 capsule 0  . phentermine 30 MG capsule Take 1 capsule (30 mg total) by mouth every morning. 30 capsule 2  . tiZANidine (ZANAFLEX) 2 MG tablet Take 1 tablet (2 mg total) by mouth at bedtime as needed for muscle spasms. 30 tablet 5   No facility-administered medications prior to visit.      EXAM:  There were no vitals taken for this visit.  There is no height or weight on file to calculate BMI.  GENERAL: vitals reviewed and listed above, alert, oriented, appears well hydrated and in no acute distress HEENT: atraumatic, conjunctiva  clear, no obvious abnormalities on inspection of external nose and ears OP : no lesion edema or exudate  NECK: no obvious masses on inspection palpation  LUNGS: clear to auscultation bilaterally, no wheezes, rales or rhonchi, good air movement CV: HRRR, no clubbing cyanosis or  peripheral edema nl cap refill  MS: moves all extremities without noticeable focal  abnormality PSYCH: pleasant and cooperative, no obvious depression or anxiety Lab Results  Component  Value Date   WBC 5.7 06/12/2017   HGB 14.4  06/12/2017   HCT 42.4 06/12/2017   PLT 319.0 06/12/2017   GLUCOSE 92 06/12/2017   CHOL 206 (H) 06/12/2017   TRIG 127.0 06/12/2017   HDL 35.40 (L) 06/12/2017   LDLCALC 145 (H) 06/12/2017   ALT 24 06/12/2017   AST 21 06/12/2017   NA 139 06/12/2017   K 4.1 06/12/2017   CL 99 06/12/2017   CREATININE 0.69 06/12/2017   BUN 17 06/12/2017   CO2 32 06/12/2017   TSH 0.88 06/12/2017   HGBA1C 6.4 06/12/2017   MICROALBUR <0.7 11/19/2014   BP Readings from Last 3 Encounters:  08/15/17 128/78  06/12/17 138/86  01/28/17 110/80    ASSESSMENT AND PLAN:  Discussed the following assessment and plan:  No diagnosis found.  -Patient advised to return or notify health care team  if  new concerns arise.  There are no Patient Instructions on file for this visit.   Standley Brooking. Neythan Kozlov M.D.

## 2017-11-15 ENCOUNTER — Ambulatory Visit: Payer: BLUE CROSS/BLUE SHIELD | Admitting: Internal Medicine

## 2017-11-17 ENCOUNTER — Encounter: Payer: Self-pay | Admitting: Gastroenterology

## 2017-12-08 ENCOUNTER — Other Ambulatory Visit: Payer: Self-pay | Admitting: Internal Medicine

## 2017-12-16 ENCOUNTER — Ambulatory Visit: Payer: BLUE CROSS/BLUE SHIELD | Admitting: Internal Medicine

## 2018-01-03 NOTE — Progress Notes (Signed)
No chief complaint on file.   HPI: Beth Wallace 52 y.o. come in for Chronic disease management  has HYPERTENSION; BACK PAIN WITH RADICULOPATHY; OBESITY; GERD (gastroesophageal reflux disease); Dysphagia, unspecified(787.20); H/O bee sting allergy; Stress; Back pain; Sciatica neuralgia; Achilles tendinitis; Low HDL (under 40); Visit for preventive health examination; Severe obesity (BMI >= 40) ([facility] [place]); Hyperglycemia; Vitamin D deficiency; Essential hypertension; Cervicogenic headache; and BMI 40.0-44.9, adult ([place]) on their problem list.    Weight   Came back  Stressing  But not as active.  Not moving .   Medication helped a lot but just didn't get back for refill .  Had lost to 268 range  .   No vision  Changes   Tingling pulling left   Shoulder arm to hand  No weakness  To see dr patel in march neck had gotten better with weight loss   Trying voltaren gel from [facility] and very helpful  Asks for refill alprazolam and only takes about once a month buit husbands health a big issue .   bp has been better    ROS: See pertinent positives and negatives per HPI.  Past Medical History:  Diagnosis Date  . ADJ DISORDER WITH MIXED ANXIETY & DEPRESSED MOOD 12/14/2009   Qualifier: Diagnosis of  By: Regis Bill MD, Standley Brooking   . Allergic reaction 05/18/2011  . Anxiety   . GERD (gastroesophageal reflux disease)   . History of gallstones   . HTN (hypertension)   . Metrorrhagia 12/14/2009   Qualifier: Diagnosis of  By: Regis Bill MD, Standley Brooking With pelvic pain  Sees Dr Philis Pique and tobotic hysterectomy scheduled   . Obesity   . Rapid palpitations 01/23/2012   Probably related to stress had negative cardiac workup in the past rule out metabolic   . TMJ (temporomandibular joint disorder)     Family History  Problem Relation Age of Onset  . Heart disease Mother 43       Died age 61, MI  . Heart attack Mother   . Hypertension Mother   . Hypertension Father        Living  . Diabetes Father   . Heart  disease Father   . Other Sister        Killed, age 14  . Other Daughter        Killed, age 27  . Hyperlipidemia Neg Hx   . Sudden death Neg Hx     Social History   Socioeconomic History  . Marital status: Married    Spouse name: Not on file  . Number of children: 1  . Years of education: Not on file  . Highest education level: Not on file  Occupational History  . Occupation: Best boy: [employer]  Social Needs  . Financial resource strain: Not on file  . Food insecurity:    Worry: Not on file    Inability: Not on file  . Transportation needs:    Medical: Not on file    Non-medical: Not on file  Tobacco Use  . Smoking status: Current Some Day Smoker    Packs/day: 0.25    Types: Cigarettes    Last attempt to quit: 01/22/2012    Years since quitting: 5.9  . Smokeless tobacco: Never Used  . Tobacco comment: 1-2 cigarretes daily  Substance and Sexual Activity  . Alcohol use: No    Alcohol/week: 0.0 standard drinks  . Drug use: No  . Sexual activity: Not on file  Lifestyle  .  Physical activity:    Days per week: Not on file    Minutes per session: Not on file  . Stress: Not on file  Relationships  . Social connections:    Talks on phone: Not on file    Gets together: Not on file    Attends religious service: Not on file    Active member of club or organization: Not on file    Attends meetings of clubs or organizations: Not on file    Relationship status: Not on file  Other Topics Concern  . 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 Azelastine-Fluticasone (DYMISTA) 137-50 MCG/ACT SUSP Dymista 137 mcg-50 mcg/spray nasal spray    . cetirizine (ZYRTEC) 10 MG tablet Take 10 mg by mouth at bedtime.    . fexofenadine (ALLEGRA) 180 MG tablet Take 180 mg by mouth daily.    . hydrOXYzine (ATARAX/VISTARIL) 10 MG tablet Take 10 mg by mouth 3 (three) times daily as needed for itching.    Marland Kitchen ibuprofen (ADVIL,MOTRIN) 800 MG tablet TAKE 1 TABLET BY MOUTH EVERY 8 HOURS AS NEEDED FOR CRAMPS 30 tablet 2  . lisinopril-hydrochlorothiazide (PRINZIDE,ZESTORETIC) 20-25 MG tablet Take 1 tablet by mouth daily. 90 tablet 3  . methocarbamol (ROBAXIN) 500 MG tablet Take 1 tablet (500 mg total) by mouth 2 (two) times daily as needed for muscle spasms (neck pain). 60 tablet 5  . naproxen (NAPROSYN) 375 MG tablet Take 375 mg by mouth daily as needed for mild pain.    . naproxen sodium (ALEVE) 220 MG tablet Take 440 mg by mouth daily as needed (pain).    Marland Kitchen omeprazole (PRILOSEC) 20 MG capsule TAKE 1 CAPSULE BY MOUTH EVERY DAY 90 capsule 0  . tiZANidine (ZANAFLEX) 2 MG tablet Take 1 tablet (2 mg total) by mouth at bedtime as needed for muscle spasms. 30 tablet 5  . ALPRAZolam (XANAX) 0.5 MG tablet TAKE 1 TABLET BY MOUTH 3 TIMES A DAY AS NEEDED FOR ANXIETY.avoid regular use 24 tablet 0  . phentermine 30 MG capsule Take 1 capsule (30 mg total) by mouth every morning. 30 capsule 2   No facility-administered medications prior to visit.      EXAM:  BP 130/90 (BP Location: Left Arm, Patient Position: Sitting, Cuff Size: Large)   Pulse 78   Temp 98.2 F (36.8 C) (Oral)   Wt 296 lb (134.3 kg)   SpO2 96%   BMI 46.71 kg/m   Body mass index is 46.71 kg/m.  GENERAL: vitals reviewed and listed above, alert, oriented, appears well hydrated and in no acute  distress HEENT: atraumatic, conjunctiva  clear, no obvious abnormalities on  inspection of external nose and ears MS: moves all extremities without noticeable focal  abnormality PSYCH: pleasant and cooperative, no obvious depression or anxiety Lab Results  Component Value Date   WBC 5.7 06/12/2017   HGB 14.4 06/12/2017   HCT 42.4 06/12/2017   PLT 319.0 06/12/2017   GLUCOSE 92 06/12/2017   CHOL 206 (H) 06/12/2017   TRIG 127.0 06/12/2017   HDL 35.40 (L) 06/12/2017   LDLCALC 145 (H) 06/12/2017   ALT 24 06/12/2017   AST 21 06/12/2017   NA 139 06/12/2017   K 4.1 06/12/2017   CL 99 06/12/2017   CREATININE 0.69 06/12/2017   BUN 17 06/12/2017   CO2 32 06/12/2017   TSH 0.88 06/12/2017   HGBA1C 6.4 06/12/2017   MICROALBUR <0.7 11/19/2014   BP Readings from Last 3 Encounters:  01/06/18 130/90  08/15/17 128/78  06/12/17 138/86   Wt Readings from Last 3 Encounters:  01/06/18 296 lb (134.3 kg)  08/15/17 280 lb 6.4 oz (127.2 kg)  06/12/17 294 lb 4.8 oz (133.5 kg)   Fasting today   ASSESSMENT AND PLAN:  Discussed the following assessment and plan:  Essential hypertension - Plan: Lipid panel, Hemoglobin B8M, Basic metabolic panel  Medication management - Plan: Lipid panel, Hemoglobin L5Q, Basic metabolic panel  BMI 49.2-01.0, adult ([facility]) - Plan: Lipid panel, Hemoglobin O7H, Basic metabolic panel  Severe obesity (BMI >= 40) ([facility]) - Plan: Lipid panel, Hemoglobin Q1F, Basic metabolic panel  Hyperglycemia - Plan: Lipid panel, Hemoglobin X5O, Basic metabolic panel  Dysesthesia  Low HDL (under 40)  Stress due to illness of family member unfortunately gained back weight disc   Strategies and med seemed to help a lot with snacking   Benefit more than risk of medications  to continue.   And rov in 3 mos   Will rex topical nsaid  Safer than oral.   Refill alprazalam cautious use.  Flu vaccine today  Total visit 6mins > 50% spent counseling and coordinating care as indicated in above note and in instructions to patient .   -Patient advised to return or  notify health care team  if  new concerns arise. In interim   Patient Instructions  Stop snacking   ! .  Go back on phentermine .  Lab today  ROV in 3 months or as needed   Flu vaccine today  Sounds like a pinched nerve .      Standley Brooking. Panosh M.D.

## 2018-01-06 ENCOUNTER — Ambulatory Visit (INDEPENDENT_AMBULATORY_CARE_PROVIDER_SITE_OTHER): Payer: BLUE CROSS/BLUE SHIELD | Admitting: Internal Medicine

## 2018-01-06 ENCOUNTER — Encounter: Payer: Self-pay | Admitting: Internal Medicine

## 2018-01-06 VITALS — BP 130/90 | HR 78 | Temp 98.2°F | Wt 296.0 lb

## 2018-01-06 DIAGNOSIS — R739 Hyperglycemia, unspecified: Secondary | ICD-10-CM

## 2018-01-06 DIAGNOSIS — Z6379 Other stressful life events affecting family and household: Secondary | ICD-10-CM

## 2018-01-06 DIAGNOSIS — R208 Other disturbances of skin sensation: Secondary | ICD-10-CM

## 2018-01-06 DIAGNOSIS — I10 Essential (primary) hypertension: Secondary | ICD-10-CM

## 2018-01-06 DIAGNOSIS — Z6841 Body Mass Index (BMI) 40.0 and over, adult: Secondary | ICD-10-CM

## 2018-01-06 DIAGNOSIS — Z23 Encounter for immunization: Secondary | ICD-10-CM | POA: Diagnosis not present

## 2018-01-06 DIAGNOSIS — E786 Lipoprotein deficiency: Secondary | ICD-10-CM

## 2018-01-06 DIAGNOSIS — Z79899 Other long term (current) drug therapy: Secondary | ICD-10-CM | POA: Diagnosis not present

## 2018-01-06 LAB — BASIC METABOLIC PANEL
BUN: 15 mg/dL (ref 6–23)
CHLORIDE: 101 meq/L (ref 96–112)
CO2: 30 meq/L (ref 19–32)
CREATININE: 0.71 mg/dL (ref 0.40–1.20)
Calcium: 9.5 mg/dL (ref 8.4–10.5)
GFR: 111.85 mL/min (ref >60.00)
Glucose, Bld: 117 mg/dL — ABNORMAL HIGH (ref 70–99)
Potassium: 4.5 mEq/L (ref 3.5–5.1)
Sodium: 138 mEq/L (ref 135–145)

## 2018-01-06 LAB — LIPID PANEL
CHOLESTEROL: 197 mg/dL (ref 0–200)
HDL: 35.8 mg/dL — AB (ref >39.00)
LDL CALC: 139 mg/dL — AB (ref 0–99)
NonHDL: 160.96
TRIGLYCERIDES: 109 mg/dL (ref 0.0–149.0)
Total CHOL/HDL Ratio: 5
VLDL: 21.8 mg/dL (ref 0.0–40.0)

## 2018-01-06 LAB — HEMOGLOBIN A1C: HEMOGLOBIN A1C: 6.6 % — AB (ref 4.6–6.5)

## 2018-01-06 MED ORDER — DICLOFENAC SODIUM 1 % TD GEL: 4.0000 g | Freq: Four times a day (QID) | TRANSDERMAL | 1 refills | Status: DC

## 2018-01-06 MED ORDER — ALPRAZOLAM 0.5 MG PO TABS: ORAL_TABLET | ORAL | 0 refills | Status: DC

## 2018-01-06 MED ORDER — PHENTERMINE HCL 30 MG PO CAPS: 30.0000 mg | ORAL_CAPSULE | ORAL | 2 refills | Status: DC

## 2018-01-06 NOTE — Patient Instructions (Addendum)
Stop snacking   ! .  Go back on phentermine .  Lab today  ROV in 3 months or as needed   Flu vaccine today  Sounds like a pinched nerve .

## 2018-01-09 NOTE — Progress Notes (Signed)
Follow-up Visit   Date: 01/10/18    Beth Wallace MRN: [id number] DOB: 1965/11/17   Interim History: Beth Wallace is a 52 y.o. right-handed African American female with hypertension, seasonal allergies, and GERD returning to the clinic with new complaints of left neck and shoulder pain. She was previously seen for cervicogenic headaches.  The patient was accompanied to the clinic by self.  History of present illness: Starting around January 2016, she started developing pressure over the vertex of the head and ocassionally over the forehead. Pain is improved by walking and resting. It is worse when she is working and lasts several hours. It can occur daily but she usually does not notice the discomfort when she is not working. She has tried excedrin migraine, aleve, and tylenol none which provided significant relief. She was given prednisone injection but this did not help either.   She previously had migraine when pregnant which would be associated with photophobia and phonophobia.   She is a delightful lady who unfortunately has lost her 63 year old daughter and younger sister in a triple homicide. She has a tremendous strength and integrity of character and has managed to deal with such tragedy in an effective manner.   In early 2017, she started neck physiotherapy with at least 50% improvement in her head pressure. Starting in late January 2018, she began having neck pain, described as soreness, which sometimes also involves her arms. There is no numbness/tingling or weakness.  She feels that it may be related to how much time she spends on her computer and poor posture so is having an adjustable desk placed at home.  She tried naproxen which helps.  She also saw a chiropractor and felt that this has improved her range of motion.   UPDATE 01/28/2017:  She was doing really well until earlier this year.  She was told that employees at [employer] were going to be laid off  which made her worry about her team.  The following day, started having severe left neck tension and achy pain limited neck movement.  On January 8th, she went to [facility] ER and was administered trigger point injection, oxycodone, and valium.  She takes flexeril 5mg  at bedtime, but makes her too sleepy, so takes methocarbamol during the day.  Over the past week, symptoms have improved and she no longer had radiating pain into her left shoulder, but she continues to have neck stiffness.    UPDATE 01/09/2018:   Over the past few months, she started having sharp pain in the neck and tingling radiating into the left arm. Her hand falls asleep intermittently.   She also has achy pain over the left neck, shoulder, and arm.  Neck rotation can trigger her pain.  She does not have weakness of the hands.  Headaches are doing much better.  She takes tizanidine as needed.  She had done PT in the past with marked benefit.  Medications:  Current Outpatient Medications on File Prior to Visit  Medication Sig Dispense Refill  . ALPRAZolam (XANAX) 0.5 MG tablet TAKE 1 TABLET BY MOUTH 3 TIMES A DAY AS NEEDED FOR ANXIETY.avoid regular use 24 tablet 0  . Azelastine-Fluticasone (DYMISTA) 137-50 MCG/ACT SUSP Dymista 137 mcg-50 mcg/spray nasal spray    . cetirizine (ZYRTEC) 10 MG tablet Take 10 mg by mouth at bedtime.    . diclofenac sodium (VOLTAREN) 1 % GEL Apply 4 g topically 4 (four) times daily. For pain 4 Tube 1  . fexofenadine (ALLEGRA) 180  MG tablet Take 180 mg by mouth daily.    . hydrOXYzine (ATARAX/VISTARIL) 10 MG tablet Take 10 mg by mouth 3 (three) times daily as needed for itching.    Marland Kitchen ibuprofen (ADVIL,MOTRIN) 800 MG tablet TAKE 1 TABLET BY MOUTH EVERY 8 HOURS AS NEEDED FOR CRAMPS 30 tablet 2  . lisinopril-hydrochlorothiazide (PRINZIDE,ZESTORETIC) 20-25 MG tablet Take 1 tablet by mouth daily. 90 tablet 3  . methocarbamol (ROBAXIN) 500 MG tablet Take 1 tablet (500 mg total) by mouth 2 (two) times daily as  needed for muscle spasms (neck pain). 60 tablet 5  . naproxen sodium (ALEVE) 220 MG tablet Take 440 mg by mouth daily as needed (pain).    Marland Kitchen omeprazole (PRILOSEC) 20 MG capsule TAKE 1 CAPSULE BY MOUTH EVERY DAY 90 capsule 0  . phentermine 30 MG capsule Take 1 capsule (30 mg total) by mouth every morning. 30 capsule 2   No current facility-administered medications on file prior to visit.     Allergies:  Allergies  Allergen Reactions  . Bee Venom Shortness Of Breath  . Contrast Media [Iodinated Diagnostic Agents] Anaphylaxis    CT dye ivp Throat swelling    Review of Systems:  CONSTITUTIONAL: No fevers, chills, night sweats, or weight loss.  EYES: No visual changes or eye pain ENT: No hearing changes.  No history of nose bleeds.   RESPIRATORY: No cough, wheezing and shortness of breath.   CARDIOVASCULAR: Negative for chest pain, and palpitations.   GI: Negative for abdominal discomfort, blood in stools or black stools.  No recent change in bowel habits.   GU:  No history of incontinence.   MUSCLOSKELETAL: No history of joint pain or swelling.  No myalgias.   SKIN: Negative for lesions, rash, and itching.   ENDOCRINE: Negative for cold or heat intolerance, polydipsia or goiter.   PSYCH:  No depression or anxiety symptoms.   NEURO: As Above.   Vital Signs:  BP 120/88   Pulse 65   Ht 5\' 8"  (1.727 m)   Wt 297 lb (134.7 kg)   SpO2 94%   BMI 45.16 kg/m   General Medical Exam:   General:  Well appearing, comfortable  Eyes/ENT: see cranial nerve examination.   Neck: No masses appreciated.  Limited neck extension due to pain, neck rotation is normal.  No carotid bruits. Respiratory:  Clear to auscultation, good air entry bilaterally.   Cardiac:  Regular rate and rhythm, no murmur.   Ext:  No edema  Neurological Exam: MENTAL STATUS including orientation to time, place, person, recent and remote memory, attention span and concentration, language, and fund of knowledge is normal.   Speech is not dysarthric.  CRANIAL NERVES: Pupils equal round and reactive to light.  Normal conjugate, extra-ocular eye movements in all directions of gaze.  Face is symmetric. Palate elevates symmetrically.  Tongue is midline.  MOTOR:  Motor strength is 5/5 in all extremities.  She has cervical muscle tension and reduced neck ROM with rotation, neck flexion, and side bending.   REFLEXES:  Reflexes are 2+/4 throughout, except brisk when testing C8 reflex at the palm  SENSORY:  Vibration and temperature intact throughout.   COORDINATION/GAIT:   Gait narrow based and stable.   Data: XR cervical spine 12/30/2013: Mild spondylosis at C5-6. No acute osseous abnormality.  IMPRESSION: Left cervical radiculopathy    - Start neck physiotherapy at [facility]  - Start naproxen 500mg  BID as needed for neck pain   - Continue methocarbamol 500mg  in the  morning (less sedation) and start tizanidine 2mg  at bedtime (more effective)  - If no improvement, proceed MRI cervical spine wo contrast  Tension headaches, improved.  Return to clinic as needed  Thank you for allowing me to participate in patient's care.  If I can answer any additional questions, I would be pleased to do so.    Sincerely,    Donika K. Posey Pronto, DO

## 2018-01-10 ENCOUNTER — Encounter: Payer: Self-pay | Admitting: Neurology

## 2018-01-10 ENCOUNTER — Ambulatory Visit (INDEPENDENT_AMBULATORY_CARE_PROVIDER_SITE_OTHER): Payer: BLUE CROSS/BLUE SHIELD | Admitting: Neurology

## 2018-01-10 VITALS — BP 120/88 | HR 65 | Ht 68.0 in | Wt 297.0 lb

## 2018-01-10 DIAGNOSIS — M5412 Radiculopathy, cervical region: Secondary | ICD-10-CM

## 2018-01-10 DIAGNOSIS — G44221 Chronic tension-type headache, intractable: Secondary | ICD-10-CM

## 2018-01-10 MED ORDER — NAPROXEN 500 MG PO TABS: 500.0000 mg | ORAL_TABLET | Freq: Two times a day (BID) | ORAL | 0 refills | Status: DC | PRN

## 2018-01-10 MED ORDER — TIZANIDINE HCL 2 MG PO TABS: 2.0000 mg | ORAL_TABLET | Freq: Every evening | ORAL | 5 refills | Status: DC | PRN

## 2018-01-10 NOTE — Patient Instructions (Addendum)
We will refer you to [facility] for neck physiotherapy   If your pain does not improve, please call my office and we will order MRI   We wish you a happy and healthy New Year!

## 2018-01-16 ENCOUNTER — Encounter: Payer: Self-pay | Admitting: Gastroenterology

## 2018-01-16 ENCOUNTER — Other Ambulatory Visit: Payer: Self-pay | Admitting: Internal Medicine

## 2018-01-16 MED ORDER — METFORMIN HCL ER 500 MG PO TB24: 500.0000 mg | ORAL_TABLET | Freq: Every day | ORAL | 3 refills | Status: DC

## 2018-01-28 NOTE — Telephone Encounter (Signed)
You can chose to wait ( but not too long)    Metformin would be helpful in addison to your weight loss  .  If you were my family member I would advise  Taking med for prevention of progression of blood sugar elevations. As long as tolerated .

## 2018-02-02 ENCOUNTER — Other Ambulatory Visit: Payer: Self-pay | Admitting: Neurology

## 2018-02-03 ENCOUNTER — Ambulatory Visit (AMBULATORY_SURGERY_CENTER): Payer: Self-pay | Admitting: *Deleted

## 2018-02-03 VITALS — Ht 68.0 in | Wt 291.0 lb

## 2018-02-03 DIAGNOSIS — Z1211 Encounter for screening for malignant neoplasm of colon: Secondary | ICD-10-CM

## 2018-02-03 MED ORDER — NA SULFATE-K SULFATE-MG SULF 17.5-3.13-1.6 GM/177ML PO SOLN: 1.0000 | Freq: Once | ORAL | 0 refills | Status: AC

## 2018-02-03 NOTE — Progress Notes (Signed)
No egg or soy allergy known to patient  No issues with past sedation with any surgeries  or procedures, no intubation problems  On phentermine diet pills per patient daily- pt aware has to stop these 10 days before colonoscopy  No home 02 use per patient  No blood thinners per patient  Pt denies issues with constipation  No A fib or A flutter  EMMI video sent to pt's e mail  Suprep online $15 coupon for pt  Pt is NOT taking metformin yet- diet and exercise to control Pre DM

## 2018-02-24 ENCOUNTER — Ambulatory Visit (AMBULATORY_SURGERY_CENTER): Payer: BLUE CROSS/BLUE SHIELD | Admitting: Gastroenterology

## 2018-02-24 ENCOUNTER — Encounter: Payer: Self-pay | Admitting: Gastroenterology

## 2018-02-24 VITALS — BP 119/79 | HR 65 | Temp 97.3°F | Resp 13 | Ht 68.0 in | Wt 291.0 lb

## 2018-02-24 DIAGNOSIS — Z1211 Encounter for screening for malignant neoplasm of colon: Secondary | ICD-10-CM | POA: Diagnosis not present

## 2018-02-24 MED ORDER — SODIUM CHLORIDE 0.9 % IV SOLN: 500.0000 mL | Freq: Once | INTRAVENOUS | Status: DC

## 2018-02-24 NOTE — Op Note (Signed)
[facility] Patient Name: Beth Wallace Procedure Date: 02/24/2018 8:28 AM MRN: [id number] Endoscopist: Ladene Artist , MD Age: 53 Referring MD:  Date of Birth: February 24, 1965 Gender: Female Account #: [id number] Procedure:                Colonoscopy Indications:              Screening for colorectal malignant neoplasm Medicines:                Monitored Anesthesia Care Procedure:                Pre-Anesthesia Assessment:                           - Prior to the procedure, a History and Physical                            was performed, and patient medications and                            allergies were reviewed. The patient's tolerance of                            previous anesthesia was also reviewed. The risks                            and benefits of the procedure and the sedation                            options and risks were discussed with the patient.                            All questions were answered, and informed consent                            was obtained. Prior Anticoagulants: The patient has                            taken no previous anticoagulant or antiplatelet                            agents. ASA Grade Assessment: II - A patient with                            mild systemic disease. After reviewing the risks                            and benefits, the patient was deemed in                            satisfactory condition to undergo the procedure.                           After obtaining informed consent, the colonoscope  was passed under direct vision. Throughout the                            procedure, the patient's blood pressure, pulse, and                            oxygen saturations were monitored continuously. The                            Colonoscope was introduced through the anus and                            advanced to the the cecum, identified by                            appendiceal orifice  and ileocecal valve. The                            ileocecal valve, appendiceal orifice, and rectum                            were photographed. The quality of the bowel                            preparation was good. The colonoscopy was performed                            without difficulty. The patient tolerated the                            procedure well. Scope In: 8:34:04 AM Scope Out: 8:47:48 AM Scope Withdrawal Time: 0 hours 11 minutes 26 seconds  Total Procedure Duration: 0 hours 13 minutes 44 seconds  Findings:                 The perianal and digital rectal examinations were                            normal.                           A few medium-mouthed diverticula were found in the                            left colon.                           Internal hemorrhoids were found during                            retroflexion. The hemorrhoids were small and Grade                            I (internal hemorrhoids that do not prolapse).  The exam was otherwise without abnormality on                            direct and retroflexion views. Complications:            No immediate complications. Estimated blood loss:                            None. Estimated Blood Loss:     Estimated blood loss: none. Impression:               - Internal hemorrhoids.                           - Mild diverticulosis in the left colon.                           - The examination was otherwise normal on direct                            and retroflexion views.                           - No specimens collected. Recommendation:           - Repeat colonoscopy in 10 years for screening                            purposes.                           - Patient has a contact number available for                            emergencies. The signs and symptoms of potential                            delayed complications were discussed with the                             patient. Return to normal activities tomorrow.                            Written discharge instructions were provided to the                            patient.                           - High fiber diet.                           - Continue present medications. Ladene Artist, MD 02/24/2018 8:51:07 AM This report has been signed electronically.

## 2018-02-24 NOTE — Progress Notes (Signed)
A and O x3. Report to RN. Tolerated MAC anesthesia well.

## 2018-02-24 NOTE — Progress Notes (Signed)
Pt. Reports no change in her medical or surgical history since her pre-visit 02/03/2018.

## 2018-02-24 NOTE — Patient Instructions (Signed)
YOU HAD AN ENDOSCOPIC PROCEDURE TODAY AT THE [facility]:   Refer to the procedure report that was given to you for any specific questions about what was found during the examination.  If the procedure report does not answer your questions, please call your gastroenterologist to clarify.  If you requested that your care partner not be given the details of your procedure findings, then the procedure report has been included in a sealed envelope for you to review at your convenience later.  YOU SHOULD EXPECT: Some feelings of bloating in the abdomen. Passage of more gas than usual.  Walking can help get rid of the air that was put into your GI tract during the procedure and reduce the bloating. If you had a lower endoscopy (such as a colonoscopy or flexible sigmoidoscopy) you may notice spotting of blood in your stool or on the toilet paper. If you underwent a bowel prep for your procedure, you may not have a normal bowel movement for a few days.  Please Note:  You might notice some irritation and congestion in your nose or some drainage.  This is from the oxygen used during your procedure.  There is no need for concern and it should clear up in a day or so.  SYMPTOMS TO REPORT IMMEDIATELY:   Following lower endoscopy (colonoscopy or flexible sigmoidoscopy):  Excessive amounts of blood in the stool  Significant tenderness or worsening of abdominal pains  Swelling of the abdomen that is new, acute  Fever of 100F or higher  For urgent or emergent issues, a gastroenterologist can be reached at any hour by calling [phone].   DIET:  We do recommend a small meal at first, but then you may proceed to your regular diet.  Drink plenty of fluids but you should avoid alcoholic beverages for 24 hours.  ACTIVITY:  You should plan to take it easy for the rest of today and you should NOT DRIVE or use heavy machinery until tomorrow (because of the sedation medicines used during the test).     FOLLOW UP: Our staff will call the number listed on your records the next business day following your procedure to check on you and address any questions or concerns that you may have regarding the information given to you following your procedure. If we do not reach you, we will leave a message.  However, if you are feeling well and you are not experiencing any problems, there is no need to return our call.  We will assume that you have returned to your regular daily activities without incident.  If any biopsies were taken you will be contacted by phone or by letter within the next 1-3 weeks.  Please call us at [phone] if you have not heard about the biopsies in 3 weeks.    SIGNATURES/CONFIDENTIALITY: You and/or your care partner have signed paperwork which will be entered into your electronic medical record.  These signatures attest to the fact that that the information above on your After Visit Summary has been reviewed and is understood.  Full responsibility of the confidentiality of this discharge information lies with you and/or your care-partner. 

## 2018-02-25 ENCOUNTER — Telehealth: Payer: Self-pay

## 2018-02-25 NOTE — Telephone Encounter (Signed)
  Follow up Call-  Call back number 02/24/2018  Post procedure Call Back phone  # [phone]  Permission to leave phone message Yes  Some recent data might be hidden     Patient questions:  Do you have a fever, pain , or abdominal swelling? No. Pain Score  0 *  Have you tolerated food without any problems? Yes.    Have you been able to return to your normal activities? Yes.    Do you have any questions about your discharge instructions: Diet   No. Medications  No. Follow up visit  No.  Do you have questions or concerns about your Care? No.  Actions: * If pain score is 4 or above: No action needed, pain <4.

## 2018-03-18 ENCOUNTER — Other Ambulatory Visit: Payer: Self-pay | Admitting: Internal Medicine

## 2018-03-25 ENCOUNTER — Other Ambulatory Visit: Payer: Self-pay | Admitting: Internal Medicine

## 2018-03-28 ENCOUNTER — Ambulatory Visit: Payer: BLUE CROSS/BLUE SHIELD | Admitting: Neurology

## 2018-03-28 ENCOUNTER — Encounter

## 2018-04-07 ENCOUNTER — Ambulatory Visit: Payer: BLUE CROSS/BLUE SHIELD | Admitting: Internal Medicine

## 2018-04-08 ENCOUNTER — Other Ambulatory Visit: Payer: Self-pay | Admitting: Neurology

## 2018-04-15 ENCOUNTER — Telehealth: Payer: BLUE CROSS/BLUE SHIELD | Admitting: Physician Assistant

## 2018-04-15 DIAGNOSIS — J301 Allergic rhinitis due to pollen: Secondary | ICD-10-CM

## 2018-04-15 NOTE — Progress Notes (Signed)
E visit for Allergic Rhinitis We are sorry that you are not feeling well.  Here is how we plan to help!  Based on what you have shared with me it looks like you have Allergic Rhinitis.  Rhinitis is when a reaction occurs that causes nasal congestion, runny nose, sneezing, and itching.  Most types of rhinitis are caused by an inflammation and are associated with symptoms in the eyes ears or throat. There are several types of rhinitis.  The most common are acute rhinitis, which is usually caused by a viral illness, allergic or seasonal rhinitis, and nonallergic or year-round rhinitis.  Nasal allergies occur certain times of the year.  Allergic rhinitis is caused when allergens in the air trigger the release of histamine in the body.  Histamine causes itching, swelling, and fluid to build up in the fragile linings of the nasal passages, sinuses and eyelids.  An itchy nose and clear discharge are common.  I recommend the following over the counter treatments: You should take a daily dose of antihistamine so please continue the zyrtec.  Okay to continue the pseudoephedrine as well.   I also would recommend a nasal spray: Flonase 2 sprays into each nostril once daily.  This is sold over the counter.  Flonase sensimist is a newer more pleasant nasal spray.      HOME CARE:  You can use an over-the-counter saline nasal spray as needed Avoid areas where there is heavy dust, mites, or molds Stay indoors on windy days during the pollen season Keep windows closed in home, at least in bedroom; use air conditioner. Use high-efficiency house air filter Keep windows closed in car, turn AC on re-circulate Avoid playing out with dog during pollen season  GET HELP RIGHT AWAY IF:  If your symptoms do not improve within 10 days You become short of breath You develop yellow or green discharge from your nose for over 3 days You have coughing fits  MAKE SURE YOU:  Understand these instructions Will watch  your condition Will get help right away if you are not doing well or get worse  Thank you for choosing an e-visit. Your e-visit answers were reviewed by a board certified advanced clinical practitioner to complete your personal care plan. Depending upon the condition, your plan could have included both over the counter or prescription medications. Please review your pharmacy choice. Be sure that the pharmacy you have chosen is open so that you can pick up your prescription now.  If there is a problem you may message your provider in [place] to have the prescription routed to another pharmacy. Your safety is important to [name]. If you have drug allergies check your prescription carefully.  For the next 24 hours, you can use MyChart to ask questions about today's visit, request a non-urgent call back, or ask for a work or school excuse from your e-visit provider. You will get an email in the next two days asking about your experience. I hope that your e-visit has been valuable and will speed your recovery.         ===View-only below this line===   ----- Message -----    From: Beth Wallace    Sent: 04/15/2018 12:15 PM EDT      To: E-Visit Mailing List Subject: E-Visit Submission: Sore Throat  E-Visit Submission: Sore Throat --------------------------------  Question: Do you have any of the following symptoms (please select all that apply)? Answer:   Runny or congested nose  Question: How long  have you been having these symptoms? Answer:   5 days  Question: Do you have a fever? Answer:   No, I do not have a fever  Question: Are you in close contact with anyone who has similar symptoms ? Answer:   No  Question: Are you taking any over the counter medications for your symptoms? Answer:   Yes  Question: Please list the name(s) of the over the counter medications and whether they are helping or not: Answer:   Sudafed             Zyrtec  Question: Please list your  medication allergies that you may have ? (If 'none' , please list as 'none') Answer:   Contracts dye  Question: Please list any additional comments  Answer:   Hello ,            I'm experiencing ear pain , sore throat and nasal congestion .            This is a known issue with me an my Allergist has prescribed meds in the past .            Reached out to Dr Harold Hedge and advised to contact PCP.                        I have no fever and no coughing .Marland Kitchen            Thanks  Question: Are you pregnant? Answer:   I am confident that I am not pregnant  Question: Are you breastfeeding? Answer:   No  A total of 5-10 minutes was spent evaluating this patients questionnaire and formulating a plan of care.

## 2018-04-16 ENCOUNTER — Encounter: Payer: Self-pay | Admitting: Internal Medicine

## 2018-04-16 ENCOUNTER — Other Ambulatory Visit: Payer: Self-pay

## 2018-04-16 ENCOUNTER — Ambulatory Visit (INDEPENDENT_AMBULATORY_CARE_PROVIDER_SITE_OTHER): Payer: BLUE CROSS/BLUE SHIELD | Admitting: Internal Medicine

## 2018-04-16 DIAGNOSIS — R739 Hyperglycemia, unspecified: Secondary | ICD-10-CM

## 2018-04-16 DIAGNOSIS — J309 Allergic rhinitis, unspecified: Secondary | ICD-10-CM | POA: Diagnosis not present

## 2018-04-16 DIAGNOSIS — J301 Allergic rhinitis due to pollen: Secondary | ICD-10-CM | POA: Diagnosis not present

## 2018-04-16 DIAGNOSIS — H9203 Otalgia, bilateral: Secondary | ICD-10-CM | POA: Diagnosis not present

## 2018-04-16 DIAGNOSIS — J0191 Acute recurrent sinusitis, unspecified: Secondary | ICD-10-CM

## 2018-04-16 MED ORDER — AMOXICILLIN-POT CLAVULANATE 875-125 MG PO TABS: 1.0000 | ORAL_TABLET | Freq: Two times a day (BID) | ORAL | 0 refills | Status: AC

## 2018-04-16 MED ORDER — PREDNISONE 20 MG PO TABS: 40.0000 mg | ORAL_TABLET | Freq: Every day | ORAL | 0 refills | Status: DC

## 2018-04-16 NOTE — Progress Notes (Signed)
Virtual Visit via Video Note  I connected with@ on 04/16/18 at 10:45 AM EDT by a video enabled telemedicine application and verified that I am speaking with the correct person using two identifiers. Location patient: home Location provider:work or home office Persons participating in the virtual visit: patient, provider  [place] national recommendations  regarding COVID 19 pandemic   video visit is advised over in office visit for this patient.  Discussed the limitations of evaluation and management by telemedicine and  availability of in person appointments. The patient expressed understanding and agreed to proceed.   HPI: Beth Wallace presents for video visit today with ongoing symptoms that are getting worse. She thinks since March 13 she has been having allergy symptoms that have waxed and waned but most recently have had increasing bilateral ear pain and headache frontal ethmoid area with continued eye itching some minor sore throat but no coughing or shortness of breath or fever. She contact her allergy office Dr. Fredderick Phenix who advised she call her PCP for advice. She reports getting "very bad allergy symptoms" this time of year and often goes in and gets a shot and some type of pill that helps her sinuses. She denies any serious nasal drainage but has some redness of her nose and is using her saline as aStelazine and her Flonase.  In regard to her other metabolic problems her blood sugars seem to be okay below 120 around 100.   Has lost almost 20 # since  Exercise and intervnetion since last visit  Doing well    She is working at home during the pandemic.  Is doing some exercise. ROS: See pertinent positives and negatives per HPI.  No chest pain shortness of breath does not like being on the metformin.  She has itchy eyes watering off and on.  Past Medical History:  Diagnosis Date  . ADJ DISORDER WITH MIXED ANXIETY & DEPRESSED MOOD 12/14/2009   Qualifier: Diagnosis of   By: Regis Bill MD, Standley Brooking   . Allergic reaction 05/18/2011  . Allergy    SEASONAL   . Anxiety   . Diabetes mellitus without complication ([place])    pre diabetic- not started Metformin - diet and exercise to control   . GERD (gastroesophageal reflux disease)   . Heart murmur    slight years ago   . History of gallstones   . HTN (hypertension)   . Metrorrhagia 12/14/2009   Qualifier: Diagnosis of  By: Regis Bill MD, Standley Brooking With pelvic pain  Sees Dr Philis Pique and tobotic hysterectomy scheduled   . Neuromuscular disorder ([place])    cervical issues to arms  . Obesity   . Rapid palpitations 01/23/2012   Probably related to stress had negative cardiac workup in the past rule out metabolic   . TMJ (temporomandibular joint disorder)     Past Surgical History:  Procedure Laterality Date  . ABDOMINAL HYSTERECTOMY  09/2010   partial  . CESAREAN SECTION    . CHOLECYSTECTOMY  1989  . UPPER GASTROINTESTINAL ENDOSCOPY    . WISDOM TOOTH EXTRACTION  2010    Family History  Problem Relation Age of Onset  . Heart disease Mother 62       Died age 20, MI  . Heart attack Mother   . Hypertension Mother   . Hypertension Father        Living  . Diabetes Father   . Heart disease Father   . Other Sister        Killed,  age 93  . Other Daughter        Killed, age 98  . Colon polyps Maternal Aunt   . Hyperlipidemia Neg Hx   . Sudden death Neg Hx   . Colon cancer Neg Hx   . Esophageal cancer Neg Hx   . Rectal cancer Neg Hx   . Stomach cancer Neg Hx     SOCIAL HX:    Current Outpatient Medications:  .  ALPRAZolam (XANAX) 0.5 MG tablet, TAKE 1 TABLET BY MOUTH 3 TIMES A DAY AS NEEDED FOR ANXIETY.avoid regular use, Disp: 24 tablet, Rfl: 0 .  Azelastine-Fluticasone (DYMISTA) 137-50 MCG/ACT SUSP, Dymista 137 mcg-50 mcg/spray nasal spray, Disp: , Rfl:  .  cetirizine (ZYRTEC) 10 MG tablet, Take 10 mg by mouth at bedtime., Disp: , Rfl:  .  diclofenac sodium (VOLTAREN) 1 % GEL, Apply 4 g topically 4 (four) times  daily. For pain, Disp: 4 Tube, Rfl: 1 .  fexofenadine (ALLEGRA) 180 MG tablet, Take 180 mg by mouth daily., Disp: , Rfl:  .  hydrOXYzine (ATARAX/VISTARIL) 10 MG tablet, Take 10 mg by mouth 3 (three) times daily as needed for itching., Disp: , Rfl:  .  ibuprofen (ADVIL,MOTRIN) 800 MG tablet, TAKE 1 TABLET BY MOUTH EVERY 8 HOURS AS NEEDED FOR CRAMPS, Disp: 30 tablet, Rfl: 2 .  lisinopril-hydrochlorothiazide (PRINZIDE,ZESTORETIC) 20-25 MG tablet, Take 1 tablet by mouth daily., Disp: 90 tablet, Rfl: 3 .  metFORMIN (GLUCOPHAGE-XR) 500 MG 24 hr tablet, Take 1 tablet (500 mg total) by mouth daily with breakfast., Disp: 30 tablet, Rfl: 3 .  methocarbamol (ROBAXIN) 500 MG tablet, Take 1 tablet (500 mg total) by mouth 2 (two) times daily as needed for muscle spasms (neck pain)., Disp: 60 tablet, Rfl: 5 .  naproxen (NAPROSYN) 500 MG tablet, TAKE 1 TABLET BY MOUTH EVERY 12 (TWELVE) HOURS AS NEEDED FOR MILD PAIN (NECK PAIN)., Disp: 60 tablet, Rfl: 1 .  omeprazole (PRILOSEC) 20 MG capsule, TAKE 1 CAPSULE BY MOUTH EVERY DAY, Disp: 90 capsule, Rfl: 0 .  phentermine 30 MG capsule, Take 1 capsule (30 mg total) by mouth every morning., Disp: 30 capsule, Rfl: 2 .  tiZANidine (ZANAFLEX) 2 MG tablet, Take 1 tablet (2 mg total) by mouth at bedtime as needed for muscle spasms., Disp: 30 tablet, Rfl: 5 .  amoxicillin-clavulanate (AUGMENTIN) 875-125 MG tablet, Take 1 tablet by mouth every 12 (twelve) hours for 7 days. For sinusitis, Disp: 14 tablet, Rfl: 0 .  naproxen sodium (ALEVE) 220 MG tablet, Take 440 mg by mouth daily as needed (pain)., Disp: , Rfl:  .  nitrofurantoin, macrocrystal-monohydrate, (MACROBID) 100 MG capsule, Take 100 mg by mouth 2 (two) times daily., Disp: , Rfl:  .  predniSONE (DELTASONE) 20 MG tablet, Take 2 tablets (40 mg total) by mouth daily. For 3-5 days, Disp: 10 tablet, Rfl: 0  EXAM:  VITALS per patient if applicable: Appears nontoxic mildly congested.  GENERAL: alert, oriented, appears well and  in no acute distress  HEENT: atraumatic, conjunttiva clear, no obvious abnormalities on inspection of external nose and ears Eyes look watery and itchy NECK: normal movements of the head and neck  LUNGS: on inspection no signs of respiratory distress, breathing rate appears normal, no obvious gross SOB, gasping or wheezing  CV: no obvious cyanosis  MS: moves all visible extremities without noticeable abnormality  PSYCH/NEURO: pleasant and cooperative, no obvious depression or anxiety, speech and thought processing grossly intact  ASSESSMENT AND PLAN:  Discussed the following assessment and  plan:  Acute recurrent sinusitis, unspecified location  Allergic rhinitis due to pollen, unspecified seasonality  Allergic sinusitis  Otalgia of both ears - prob referred.   Hyperglycemia - reported improvement with lsi  Ongoing respiratory symptoms but despite her allergy cocktail hygiene and she uses in the spring.  No evidence of fever by history sounds like sinusitis with secondary ear pain. Uncertain if antibiotic would be helpful however because of extenuating circumstances benefit may be more than risk. Short course prednisone and antibiotic for presumed sinusitis secondary to chronic allergy.  Risk benefit of medication discussed.   As far as recent travel she had traveled to [place] in a car with her and but basically turned around and then got to [place] to come back because of the question of reducing exposure.  She states "we never got out of the car".  And were very careful.  Expectant management and discussion of plan and treatment with patient with opportunity to ask questions and all were answered.   The patient was advised to call backif worsening having concerns    or if the condition fails to improve as anticipated.   Shanon Ace, MD

## 2018-06-12 ENCOUNTER — Encounter: Payer: Self-pay | Admitting: *Deleted

## 2018-06-13 ENCOUNTER — Telehealth (INDEPENDENT_AMBULATORY_CARE_PROVIDER_SITE_OTHER): Payer: BLUE CROSS/BLUE SHIELD | Admitting: Neurology

## 2018-06-13 ENCOUNTER — Other Ambulatory Visit: Payer: Self-pay

## 2018-06-13 VITALS — BP 125/88 | HR 87

## 2018-06-13 DIAGNOSIS — G44209 Tension-type headache, unspecified, not intractable: Secondary | ICD-10-CM

## 2018-06-13 DIAGNOSIS — M542 Cervicalgia: Secondary | ICD-10-CM

## 2018-06-13 NOTE — Progress Notes (Signed)
   Virtual Visit via Video Note The purpose of this virtual visit is to provide medical care while limiting exposure to the novel coronavirus.    Consent was obtained for video visit:  Yes.   Answered questions that patient had about telehealth interaction:  Yes.   I discussed the limitations, risks, security and privacy concerns of performing an evaluation and management service by telemedicine. I also discussed with the patient that there may be a patient responsible charge related to this service. The patient expressed understanding and agreed to proceed.  Pt location: Home Physician Location: office Name of referring provider:  Burnis Medin, MD I connected with Beth Wallace at patients initiation/request on 06/13/2018 at  1:00 PM EDT by video enabled telemedicine application and verified that I am speaking with the correct person using two identifiers. Pt MRN:  [id number] Pt DOB:  Mar 15, 1965 Video Participants:  Beth Wallace   History of Present Illness: This is a 53 y.o. female returning for follow-up of cervicogenic headaches and left cervical radiculopathy..  Left neck pain and arm pain significantly improved.  She appreciated benefit with home stretching exercises and combination of methocarbamol in the morning and tizanidine 2 mg at bedtime.  She went to [facility] twice, however did not find it to be a good fit for her and may consider restarting neck PT at a later date at a different facility, should the pain return.  Headaches are very well controlled, occurring once every 1 to 2 months.   Observations/Objective:   Vitals:   06/13/18 1312  BP: 125/88  Pulse: 87   Patient is awake, alert, and appears comfortable.  Oriented x 4.   Extraocular muscles are intact. No ptosis.  Face is symmetric.  Speech is not dysarthric.  Antigravity in all extremities.  Good neck range of motion with rotation, flexion, and extension. Gait appears normal   Assessment and  Plan:  1.  Cervicalgia, improved with combination of methocarbamol 500 mg in the morning and tizanidine 2 mg at bedtime.  She may continue to use this as needed.  Recommend home neck stretching exercises.  Should her pain return, she will notify my office to be referred to physical therapy.  2.  Tension headaches, improved with reduced frequency occurring once every 1 to 2 months.  Responsive to NSAIDs.   Follow Up Instructions:   I discussed the assessment and treatment plan with the patient. The patient was provided an opportunity to ask questions and all were answered. The patient agreed with the plan and demonstrated an understanding of the instructions.   The patient was advised to call back or seek an in-person evaluation if the symptoms worsen or if the condition fails to improve as anticipated.  Follow-up in 6 months  Total time spent:  15 minutes     Alda Berthold, DO

## 2018-06-16 ENCOUNTER — Encounter: Payer: BLUE CROSS/BLUE SHIELD | Admitting: Internal Medicine

## 2018-06-16 ENCOUNTER — Other Ambulatory Visit: Payer: Self-pay | Admitting: Internal Medicine

## 2018-06-20 ENCOUNTER — Telehealth: Payer: Self-pay | Admitting: Internal Medicine

## 2018-06-20 NOTE — Telephone Encounter (Signed)
Patient has an appointment on a Tuesday for her CPE she was wondering if she needed to reschedule her appointment.  Please Advise

## 2018-06-24 NOTE — Telephone Encounter (Signed)
Pt will keep appt and do virtual visit for refill on medications

## 2018-06-30 ENCOUNTER — Ambulatory Visit: Payer: BLUE CROSS/BLUE SHIELD | Admitting: Neurology

## 2018-06-30 NOTE — Progress Notes (Signed)
Virtual Visit via Video Note  I connected with@ on 07/01/18 at  9:00 AM EDT by a video enabled telemedicine application and verified that I am speaking with the correct person using two identifiers. Location patient: home Location provider:work office Persons participating in the virtual visit: patient, provider vidoe difficulties    So finished as phone visit  WIth national recommendations  regarding COVID 19 pandemic   video visit is advised over in office visit for this patient.  Patient aware  of the limitations of evaluation and management by telemedicine and  availability of in person appointments. and agreed to proceed.   HPI: Beth Wallace presents for video visit for Chronic disease management  Had lost weight  To the 270 range and back up  To 289   Phentermine helped but never got the refill Asks about metformin  ? If made jittery with the phentermine off for a while. Bp in range doing ok 125/80 range  Neg tad  Working from home   ROS: See pertinent positives and negatives per HPI. No cp sob  Neuro sx  Sees neuro about headacheds   Past Medical History:  Diagnosis Date  . ADJ DISORDER WITH MIXED ANXIETY & DEPRESSED MOOD 12/14/2009   Qualifier: Diagnosis of  By: Regis Bill MD, Standley Brooking   . Allergic reaction 05/18/2011  . Allergy    SEASONAL   . Anxiety   . Diabetes mellitus without complication ([place])    pre diabetic- not started Metformin - diet and exercise to control   . GERD (gastroesophageal reflux disease)   . Heart murmur    slight years ago   . History of gallstones   . HTN (hypertension)   . Metrorrhagia 12/14/2009   Qualifier: Diagnosis of  By: Regis Bill MD, Standley Brooking With pelvic pain  Sees Dr Philis Pique and tobotic hysterectomy scheduled   . Neuromuscular disorder ([place])    cervical issues to arms  . Obesity   . Rapid palpitations 01/23/2012   Probably related to stress had negative cardiac workup in the past rule out metabolic   . TMJ (temporomandibular joint  disorder)     Past Surgical History:  Procedure Laterality Date  . ABDOMINAL HYSTERECTOMY  09/2010   partial  . CESAREAN SECTION    . CHOLECYSTECTOMY  1989  . UPPER GASTROINTESTINAL ENDOSCOPY    . WISDOM TOOTH EXTRACTION  2010    Family History  Problem Relation Age of Onset  . Heart disease Mother 80       Died age 54, MI  . Heart attack Mother   . Hypertension Mother   . Hypertension Father        Living  . Diabetes Father   . Heart disease Father   . Other Sister        Killed, age 30  . Other Daughter        Killed, age 55  . Colon polyps Maternal Aunt   . Hyperlipidemia Neg Hx   . Sudden death Neg Hx   . Colon cancer Neg Hx   . Esophageal cancer Neg Hx   . Rectal cancer Neg Hx   . Stomach cancer Neg Hx     Social History   Tobacco Use  . Smoking status: Current Some Day Smoker    Packs/day: 0.25    Types: Cigarettes    Last attempt to quit: 01/22/2012    Years since quitting: 6.4  . Smokeless tobacco: Never Used  . Tobacco comment: 1-2 cigarretes daily  Substance Use Topics  . Alcohol use: No    Alcohol/week: 0.0 standard drinks  . Drug use: No      Current Outpatient Medications:  .  ALPRAZolam (XANAX) 0.5 MG tablet, TAKE 1 TABLET BY MOUTH 3 TIMES A DAY AS NEEDED FOR ANXIETY.avoid regular use, Disp: 24 tablet, Rfl: 0 .  Azelastine-Fluticasone (DYMISTA) 137-50 MCG/ACT SUSP, Dymista 137 mcg-50 mcg/spray nasal spray, Disp: , Rfl:  .  cetirizine (ZYRTEC) 10 MG tablet, Take 10 mg by mouth at bedtime., Disp: , Rfl:  .  diclofenac sodium (VOLTAREN) 1 % GEL, Apply 4 g topically 4 (four) times daily. For pain, Disp: 4 Tube, Rfl: 1 .  fexofenadine (ALLEGRA) 180 MG tablet, Take 180 mg by mouth daily., Disp: , Rfl:  .  hydrOXYzine (ATARAX/VISTARIL) 10 MG tablet, Take 10 mg by mouth 3 (three) times daily as needed for itching., Disp: , Rfl:  .  ibuprofen (ADVIL,MOTRIN) 800 MG tablet, TAKE 1 TABLET BY MOUTH EVERY 8 HOURS AS NEEDED FOR CRAMPS, Disp: 30 tablet, Rfl:  2 .  lisinopril-hydrochlorothiazide (ZESTORETIC) 20-25 MG tablet, TAKE 1 TABLET BY MOUTH EVERY DAY, Disp: 90 tablet, Rfl: 3 .  metFORMIN (GLUCOPHAGE-XR) 500 MG 24 hr tablet, Take 1 tablet (500 mg total) by mouth daily with breakfast., Disp: 30 tablet, Rfl: 3 .  methocarbamol (ROBAXIN) 500 MG tablet, Take 1 tablet (500 mg total) by mouth 2 (two) times daily as needed for muscle spasms (neck pain)., Disp: 60 tablet, Rfl: 5 .  naproxen (NAPROSYN) 500 MG tablet, TAKE 1 TABLET BY MOUTH EVERY 12 (TWELVE) HOURS AS NEEDED FOR MILD PAIN (NECK PAIN)., Disp: 60 tablet, Rfl: 1 .  nitrofurantoin, macrocrystal-monohydrate, (MACROBID) 100 MG capsule, Take 100 mg by mouth 2 (two) times daily., Disp: , Rfl:  .  omeprazole (PRILOSEC) 20 MG capsule, TAKE 1 CAPSULE BY MOUTH EVERY DAY, Disp: 90 capsule, Rfl: 0 .  phentermine 30 MG capsule, TAKE 1 CAPSULE BY MOUTH EVERY DAY IN THE MORNING, Disp: 30 capsule, Rfl: 1 .  predniSONE (DELTASONE) 20 MG tablet, Take 2 tablets (40 mg total) by mouth daily. For 3-5 days (Patient not taking: Reported on 06/12/2018), Disp: 10 tablet, Rfl: 0 .  tiZANidine (ZANAFLEX) 2 MG tablet, Take 1 tablet (2 mg total) by mouth at bedtime as needed for muscle spasms., Disp: 30 tablet, Rfl: 5  EXAM: BP Readings from Last 3 Encounters:  06/13/18 125/88  02/24/18 119/79  01/10/18 120/88    VITALS per patient 289 and 125/80  Unable to view on  Failed video PSYCH/NEURO: pleasant and cooperative, no obvious depression or anxiety, speech and thought processing grossly intact Lab Results  Component Value Date   WBC 5.7 06/12/2017   HGB 14.4 06/12/2017   HCT 42.4 06/12/2017   PLT 319.0 06/12/2017   GLUCOSE 117 (H) 01/06/2018   CHOL 197 01/06/2018   TRIG 109.0 01/06/2018   HDL 35.80 (L) 01/06/2018   LDLCALC 139 (H) 01/06/2018   ALT 24 06/12/2017   AST 21 06/12/2017   NA 138 01/06/2018   K 4.5 01/06/2018   CL 101 01/06/2018   CREATININE 0.71 01/06/2018   BUN 15 01/06/2018   CO2 30  01/06/2018   TSH 0.88 06/12/2017   HGBA1C 6.6 (H) 01/06/2018   MICROALBUR <0.7 11/19/2014    ASSESSMENT AND PLAN:  Discussed the following assessment and plan:    ICD-10-CM   1. Hyperglycemia  R73.9 POCT glycosylated hemoglobin (Hb A1C)  2. Medication management  Z79.899 POCT glycosylated hemoglobin (Hb A1C)  3. BMI 40.0-44.9, adult ([place])  Z68.41   4. Essential hypertension  I10   5. Tobacco use  Z72.0    Due a1c  Encouraged get weight back down  Stop tobacco ( 2-3 per day)   Then plan fu  cpx etc due in december Counseled.  Plan refill phetermine and metformin ( disc batches of recalled metformin   Pharmacy to check)  Expectant management and discussion of plan and treatment with opportunity to ask questions and all were answered. The patient agreed with the plan and demonstrated an understanding of the instructions.   Advised to call back or seek an in-person evaluation if worsening  or having  further concerns .  Shanon Ace, MD

## 2018-07-01 ENCOUNTER — Telehealth (INDEPENDENT_AMBULATORY_CARE_PROVIDER_SITE_OTHER): Payer: BC Managed Care – PPO | Admitting: Internal Medicine

## 2018-07-01 ENCOUNTER — Other Ambulatory Visit: Payer: Self-pay

## 2018-07-01 ENCOUNTER — Encounter: Payer: Self-pay | Admitting: Internal Medicine

## 2018-07-01 DIAGNOSIS — Z6841 Body Mass Index (BMI) 40.0 and over, adult: Secondary | ICD-10-CM

## 2018-07-01 DIAGNOSIS — R739 Hyperglycemia, unspecified: Secondary | ICD-10-CM

## 2018-07-01 DIAGNOSIS — Z79899 Other long term (current) drug therapy: Secondary | ICD-10-CM

## 2018-07-01 DIAGNOSIS — I10 Essential (primary) hypertension: Secondary | ICD-10-CM

## 2018-07-01 DIAGNOSIS — Z720 Tobacco use: Secondary | ICD-10-CM

## 2018-07-01 MED ORDER — METFORMIN HCL ER 500 MG PO TB24: 500.0000 mg | ORAL_TABLET | Freq: Every day | ORAL | 3 refills | Status: DC

## 2018-07-01 MED ORDER — PHENTERMINE HCL 30 MG PO CAPS: ORAL_CAPSULE | ORAL | 1 refills | Status: DC

## 2018-07-08 ENCOUNTER — Other Ambulatory Visit: Payer: BC Managed Care – PPO

## 2018-07-10 ENCOUNTER — Telehealth: Payer: Self-pay | Admitting: *Deleted

## 2018-07-10 NOTE — Telephone Encounter (Signed)
Copied from [place] [id number]. Topic: Appointment Scheduling - Scheduling Inquiry for Clinic >> Jul 10, 2018  4:04 PM Richardo Priest, NT wrote: Reason for CRM: Patient is wanting to reschedule her appointment tomorrow at 8:30. States she would like it Monday. Please advise and call back is [phone]

## 2018-07-11 ENCOUNTER — Other Ambulatory Visit: Payer: Self-pay

## 2018-07-11 NOTE — Telephone Encounter (Signed)
Canceled 8:30 appt and lvm for pt to call back to rs

## 2018-09-13 ENCOUNTER — Other Ambulatory Visit: Payer: Self-pay | Admitting: Internal Medicine

## 2018-09-26 ENCOUNTER — Other Ambulatory Visit: Payer: Self-pay | Admitting: Internal Medicine

## 2018-10-05 ENCOUNTER — Other Ambulatory Visit: Payer: Self-pay | Admitting: Neurology

## 2018-10-23 ENCOUNTER — Other Ambulatory Visit: Payer: Self-pay | Admitting: Internal Medicine

## 2018-10-23 NOTE — Telephone Encounter (Signed)
Last ov:07/10/2018 Last filled:01/06/18

## 2018-10-27 NOTE — Progress Notes (Signed)
Chief Complaint  Patient presents with   Follow-up    blood sugar weight meds etc    Medication Management    HPI: Beth Wallace 53 y.o. come in for flu shot and fu  Medication  Weight   Since covd has gained weight the phentermine helped but not taking now  Snacking some day  Craving sugars. ocass light headed  Stopped metformin since hear about poss recall about 2 mos ago   Her weight had been down to 278?  And now back up.   bp has been ok taking meds    ROS: See pertinent positives and negatives per HPI. No cp numbness   Spouse with dm   Past Medical History:  Diagnosis Date   ADJ DISORDER WITH MIXED ANXIETY & DEPRESSED MOOD 12/14/2009   Qualifier: Diagnosis of  By: Regis Bill MD, Standley Brooking    Allergic reaction 05/18/2011   Allergy    SEASONAL    Anxiety    Diabetes mellitus without complication ([place])    pre diabetic- not started Metformin - diet and exercise to control    GERD (gastroesophageal reflux disease)    Heart murmur    slight years ago    History of gallstones    HTN (hypertension)    Metrorrhagia 12/14/2009   Qualifier: Diagnosis of  By: Regis Bill MD, Standley Brooking With pelvic pain  Sees Dr Philis Pique and tobotic hysterectomy scheduled    Neuromuscular disorder ([place])    cervical issues to arms   Obesity    Rapid palpitations 01/23/2012   Probably related to stress had negative cardiac workup in the past rule out metabolic    TMJ (temporomandibular joint disorder)     Family History  Problem Relation Age of Onset   Heart disease Mother 30       Died age 80, MI   Heart attack Mother    Hypertension Mother    Hypertension Father        Living   Diabetes Father    Heart disease Father    Other Sister        Killed, age 3   Other Daughter        Killed, age 45   Colon polyps Maternal Aunt    Hyperlipidemia Neg Hx    Sudden death Neg Hx    Colon cancer Neg Hx    Esophageal cancer Neg Hx    Rectal cancer Neg Hx    Stomach  cancer Neg Hx     Social History   Socioeconomic History   Marital status: Married    Spouse name: Not on file   Number of children: 1   Years of education: Not on file   Highest education level: Not on file  Occupational History   Occupation: Best boy: [employer]  Social Needs   Financial resource strain: Not on file   Food insecurity    Worry: Not on file    Inability: Not on file   Transportation needs    Medical: Not on file    Non-medical: Not on file  Tobacco Use   Smoking status: Current Some Day Smoker    Packs/day: 0.25    Types: Cigarettes    Last attempt to quit: 01/22/2012    Years since quitting: 6.7   Smokeless tobacco: Never Used   Tobacco comment: 1-2 cigarretes daily  Substance and Sexual Activity   Alcohol use: No    Alcohol/week: 0.0 standard drinks   Drug  use: No   Sexual activity: Not on file  Lifestyle   Physical activity    Days per week: Not on file    Minutes per session: Not on file   Stress: Not on file  Relationships   Social connections    Talks on phone: Not on file    Gets together: Not on file    Attends religious service: Not on file    Active member of club or organization: Not on file    Attends meetings of clubs or organizations: Not on file    Relationship status: Not on file  Other Topics Concern   Not on file  Social History Narrative   Orig from [place] and changes for job,     7-8 hours sleep per night     Born in [place], [place],     [employer],     Gaffer,    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.,       Back in school gen ed   At [employer],      [facility] of 2,     BF and dog,     G3P1,     Bereaved parent daughter murdered 2008,   Trial finished  Baldo Ash guilty this week march    No etoh,      No caffeine,     Minimal tobacco,       ETS     Got married feb 14     Outpatient Medications Prior to Visit  Medication Sig Dispense Refill   ALPRAZolam (XANAX)  0.5 MG tablet TAKE 1 TABLET BY MOUTH 3 TIMES A DAY AS NEEDED FOR ANXIETY.AVOID REGULAR USE 24 tablet 0   Azelastine-Fluticasone (DYMISTA) 137-50 MCG/ACT SUSP Dymista 137 mcg-50 mcg/spray nasal spray     cetirizine (ZYRTEC) 10 MG tablet Take 10 mg by mouth at bedtime.     diclofenac sodium (VOLTAREN) 1 % GEL Apply 4 g topically 4 (four) times daily. For pain 4 Tube 1   fexofenadine (ALLEGRA) 180 MG tablet Take 180 mg by mouth daily.     hydrOXYzine (ATARAX/VISTARIL) 10 MG tablet Take 10 mg by mouth 3 (three) times daily as needed for itching.     ibuprofen (ADVIL,MOTRIN) 800 MG tablet TAKE 1 TABLET BY MOUTH EVERY 8 HOURS AS NEEDED FOR CRAMPS 30 tablet 2   lisinopril-hydrochlorothiazide (ZESTORETIC) 20-25 MG tablet TAKE 1 TABLET BY MOUTH EVERY DAY 90 tablet 3   naproxen (NAPROSYN) 500 MG tablet TAKE 1 TABLET BY MOUTH EVERY 12 (TWELVE) HOURS AS NEEDED FOR MILD PAIN (NECK PAIN). 60 tablet 1   omeprazole (PRILOSEC) 20 MG capsule TAKE 1 CAPSULE BY MOUTH EVERY DAY 90 capsule 0   tiZANidine (ZANAFLEX) 2 MG tablet Take 1 tablet (2 mg total) by mouth at bedtime as needed for muscle spasms. 30 tablet 5   metFORMIN (GLUCOPHAGE-XR) 500 MG 24 hr tablet TAKE 1 TABLET BY MOUTH EVERY DAY WITH BREAKFAST 45 tablet 0   methocarbamol (ROBAXIN) 500 MG tablet Take 1 tablet (500 mg total) by mouth 2 (two) times daily as needed for muscle spasms (neck pain). 60 tablet 5   phentermine 30 MG capsule TAKE 1 CAPSULE BY MOUTH EVERY DAY IN THE MORNING 30 capsule 1   nitrofurantoin, macrocrystal-monohydrate, (MACROBID) 100 MG capsule Take 100 mg by mouth 2 (two) times daily.     predniSONE (DELTASONE) 20 MG tablet Take 2 tablets (40 mg total) by mouth daily. For  3-5 days (Patient not taking: Reported on 10/28/2018) 10 tablet 0   No facility-administered medications prior to visit.      EXAM:  BP 122/74 (BP Location: Right Arm, Patient Position: Sitting, Cuff Size: Large)    Pulse 91    Temp 97.9 F (36.6 C)  (Temporal)    Wt (!) 304 lb 3.2 oz (138 kg)    SpO2 97%    BMI 46.25 kg/m   Body mass index is 46.25 kg/m.  GENERAL: vitals reviewed and listed above, alert, oriented, appears well hydrated and in no acute distress HEENT: atraumatic, conjunctiva  clear, no obvious abnormalities on inspection of external nose and ears OP masked   NECK: no obvious masses on inspection   CV: HRRR, no clubbing cyanosis or  peripheral edema nl cap refill  MS: moves all extremities without noticeable focal  abnormality PSYCH: pleasant and cooperative, no obvious depression or anxiety Lab Results  Component Value Date   WBC 5.7 06/12/2017   HGB 14.4 06/12/2017   HCT 42.4 06/12/2017   PLT 319.0 06/12/2017   GLUCOSE 117 (H) 01/06/2018   CHOL 197 01/06/2018   TRIG 109.0 01/06/2018   HDL 35.80 (L) 01/06/2018   LDLCALC 139 (H) 01/06/2018   ALT 24 06/12/2017   AST 21 06/12/2017   NA 138 01/06/2018   K 4.5 01/06/2018   CL 101 01/06/2018   CREATININE 0.71 01/06/2018   BUN 15 01/06/2018   CO2 30 01/06/2018   TSH 0.88 06/12/2017   HGBA1C 6.4 (A) 10/28/2018   MICROALBUR <0.7 11/19/2014   BP Readings from Last 3 Encounters:  10/28/18 122/74  06/13/18 125/88  02/24/18 119/79   Wt Readings from Last 3 Encounters:  10/28/18 (!) 304 lb 3.2 oz (138 kg)  02/24/18 291 lb (132 kg)  02/03/18 291 lb (132 kg)   data reviewed   ASSESSMENT AND PLAN:  Discussed the following assessment and plan:  Prediabetes - Plan: Basic metabolic panel, CBC with Differential/Platelet, Hepatic function panel, Lipid panel, TSH, Microalbumin / creatinine urine ratio, POCT glycosylated hemoglobin (Hb A1C)  Morbid obesity, unspecified obesity type ([place]) - Plan: Basic metabolic panel, CBC with Differential/Platelet, Hepatic function panel, Lipid panel, TSH, Microalbumin / creatinine urine ratio  Need for immunization against influenza - Plan: Flu Vaccine QUAD 36+ mos IM  lab for t for preventive health examination - Plan: Basic  metabolic panel, CBC with Differential/Platelet, Hepatic function panel, Lipid panel, TSH, Microalbumin / creatinine urine ratio  Essential hypertension - Plan: Basic metabolic panel, CBC with Differential/Platelet, Hepatic function panel, Lipid panel, TSH, Microalbumin / creatinine urine ratio  Low HDL (under 40) - Plan: Basic metabolic panel, CBC with Differential/Platelet, Hepatic function panel, Lipid panel, TSH, Microalbumin / creatinine urine ratio  Medication management - Plan: Basic metabolic panel, CBC with Differential/Platelet, Hepatic function panel, Lipid panel, TSH, Microalbumin / creatinine urine ratio Disc  above  Get back on ir metformin  500 per day and can inc to bid if tolerated  She will and will refill phentermine disc strategies and importance of  Healthy weight loss   .Marland Kitchen... Plan cpx and lab previsit in end December when due  -Patient advised to return or notify health care team  if  new concerns arise.  Patient Instructions  Get back on immediate release metformin as dicussed   Lose weight will refill phentermine .   Get on schedule for cpx in late December   Will place lab orders  To get pre visit .  Preventing Type 2 Diabetes Mellitus Type 2 diabetes (type 2 diabetes mellitus) is a long-term (chronic) disease that affects blood sugar (glucose) levels. Normally, a hormone called insulin allows glucose to enter cells in the body. The cells use glucose for energy. In type 2 diabetes, one or both of these problems may be present:  The body does not make enough insulin.  The body does not respond properly to insulin that it makes (insulin resistance). Insulin resistance or lack of insulin causes excess glucose to build up in the blood instead of going into cells. As a result, high blood glucose (hyperglycemia) develops, which can cause many complications. Being overweight or obese and having an inactive (sedentary) lifestyle can increase your risk for diabetes. Type 2  diabetes can be delayed or prevented by making certain nutrition and lifestyle changes. What nutrition changes can be made?   Eat healthy meals and snacks regularly. Keep a healthy snack with you for when you get hungry between meals, such as fruit or a handful of nuts.  Eat lean meats and proteins that are low in saturated fats, such as chicken, fish, egg whites, and beans. Avoid processed meats.  Eat plenty of fruits and vegetables and plenty of grains that have not been processed (whole grains). It is recommended that you eat: ? 1?2 cups of fruit every day. ? 2?3 cups of vegetables every day. ? 6?8 oz of whole grains every day, such as oats, whole wheat, bulgur, brown rice, quinoa, and millet.  Eat low-fat dairy products, such as milk, yogurt, and cheese.  Eat foods that contain healthy fats, such as nuts, avocado, olive oil, and canola oil.  Drink water throughout the day. Avoid drinks that contain added sugar, such as soda or sweet tea.  Follow instructions from your health care provider about specific eating or drinking restrictions.  Control how much food you eat at a time (portion size). ? Check food labels to find out the serving sizes of foods. ? Use a kitchen scale to weigh amounts of foods.  Saute or steam food instead of frying it. Cook with water or broth instead of oils or butter.  Limit your intake of: ? Salt (sodium). Have no more than 1 tsp (2,400 mg) of sodium a day. If you have heart disease or high blood pressure, have less than ? tsp (1,500 mg) of sodium a day. ? Saturated fat. This is fat that is solid at room temperature, such as butter or fat on meat. What lifestyle changes can be made? Activity   Do moderate-intensity physical activity for at least 30 minutes on at least 5 days of the week, or as much as told by your health care provider.  Ask your health care provider what activities are safe for you. A mix of physical activities may be best, such as  walking, swimming, cycling, and strength training.  Try to add physical activity into your day. For example: ? Park in spots that are farther away than usual, so that you walk more. For example, park in a far corner of the parking lot when you go to the office or the grocery store. ? Take a walk during your lunch break. ? Use stairs instead of elevators or escalators. Weight Loss  Lose weight as directed. Your health care provider can determine how much weight loss is best for you and can help you lose weight safely.  If you are overweight or obese, you may be instructed to lose at least 5?7 %  of your body weight. Alcohol and Tobacco   Limit alcohol intake to no more than 1 drink a day for nonpregnant women and 2 drinks a day for men. One drink equals 12 oz of beer, 5 oz of wine, or 1 oz of hard liquor.  Do not use any tobacco products, such as cigarettes, chewing tobacco, and e-cigarettes. If you need help quitting, ask your health care provider. Work With [place] Provider  Have your blood glucose tested regularly, as told by your health care provider.  Discuss your risk factors and how you can reduce your risk for diabetes.  Get screening tests as told by your health care provider. You may have screening tests regularly, especially if you have certain risk factors for type 2 diabetes.  Make an appointment with a diet and nutrition specialist (registered dietitian). A registered dietitian can help you make a healthy eating plan and can help you understand portion sizes and food labels. Why are these changes important?  It is possible to prevent or delay type 2 diabetes and related health problems by making lifestyle and nutrition changes.  It can be difficult to recognize signs of type 2 diabetes. The best way to avoid possible damage to your body is to take actions to prevent the disease before you develop symptoms. What can happen if changes are not made?  Your blood  glucose levels may keep increasing. Having high blood glucose for a long time is dangerous. Too much glucose in your blood can damage your blood vessels, heart, kidneys, nerves, and eyes.  You may develop prediabetes or type 2 diabetes. Type 2 diabetes can lead to many chronic health problems and complications, such as: ? Heart disease. ? Stroke. ? Blindness. ? Kidney disease. ? Depression. ? Poor circulation in the feet and legs, which could lead to surgical removal (amputation) in severe cases. Where to find support  Ask your health care provider to recommend a registered dietitian, diabetes educator, or weight loss program.  Look for local or online weight loss groups.  Join a gym, fitness club, or outdoor activity group, such as a walking club. Where to find more information To learn more about diabetes and diabetes prevention, visit:  American Diabetes Association (ADA): www.diabetes.[employer] of Diabetes and Digestive and Kidney Diseases: FindSpin.nl To learn more about healthy eating, visit:  The U.S. Department of Agriculture Scientist, research (physical sciences)), Choose My Plate: http://wiley-williams.com/  Office of Disease Prevention and Health Promotion (ODPHP), Dietary Guidelines: SurferLive.at Summary  You can reduce your risk for type 2 diabetes by increasing your physical activity, eating healthy foods, and losing weight as directed.  Talk with your health care provider about your risk for type 2 diabetes. Ask about any blood tests or screening tests that you need to have. This information is not intended to replace advice given to you by your health care provider. Make sure you discuss any questions you have with your health care provider. Document Released: 04/25/2015 Document Revised: 04/25/2018 Document Reviewed: 02/22/2015 Elsevier Patient Education  2020 [place] Deland Slocumb M.D.

## 2018-10-28 ENCOUNTER — Ambulatory Visit (INDEPENDENT_AMBULATORY_CARE_PROVIDER_SITE_OTHER): Payer: BC Managed Care – PPO | Admitting: Internal Medicine

## 2018-10-28 ENCOUNTER — Ambulatory Visit: Payer: BC Managed Care – PPO

## 2018-10-28 ENCOUNTER — Encounter: Payer: Self-pay | Admitting: Internal Medicine

## 2018-10-28 ENCOUNTER — Other Ambulatory Visit: Payer: Self-pay

## 2018-10-28 VITALS — BP 122/74 | HR 91 | Temp 97.9°F | Wt 304.2 lb

## 2018-10-28 DIAGNOSIS — Z23 Encounter for immunization: Secondary | ICD-10-CM | POA: Diagnosis not present

## 2018-10-28 DIAGNOSIS — Z0000 Encounter for general adult medical examination without abnormal findings: Secondary | ICD-10-CM

## 2018-10-28 DIAGNOSIS — I10 Essential (primary) hypertension: Secondary | ICD-10-CM

## 2018-10-28 DIAGNOSIS — R7303 Prediabetes: Secondary | ICD-10-CM

## 2018-10-28 DIAGNOSIS — E786 Lipoprotein deficiency: Secondary | ICD-10-CM

## 2018-10-28 DIAGNOSIS — Z79899 Other long term (current) drug therapy: Secondary | ICD-10-CM

## 2018-10-28 LAB — POCT GLYCOSYLATED HEMOGLOBIN (HGB A1C): Hemoglobin A1C: 6.4 % — AB (ref 4.0–5.6)

## 2018-10-28 MED ORDER — PHENTERMINE HCL 30 MG PO CAPS: ORAL_CAPSULE | ORAL | 1 refills | Status: DC

## 2018-10-28 MED ORDER — METFORMIN HCL 500 MG PO TABS: 500.0000 mg | ORAL_TABLET | Freq: Every day | ORAL | 1 refills | Status: DC

## 2018-10-28 NOTE — Patient Instructions (Addendum)
Get back on immediate release metformin as dicussed   Lose weight will refill phentermine .   Get on schedule for cpx in late December   Will place lab orders  To get pre visit .    Preventing Type 2 Diabetes Mellitus Type 2 diabetes (type 2 diabetes mellitus) is a long-term (chronic) disease that affects blood sugar (glucose) levels. Normally, a hormone called insulin allows glucose to enter cells in the body. The cells use glucose for energy. In type 2 diabetes, one or both of these problems may be present:  The body does not make enough insulin.  The body does not respond properly to insulin that it makes (insulin resistance). Insulin resistance or lack of insulin causes excess glucose to build up in the blood instead of going into cells. As a result, high blood glucose (hyperglycemia) develops, which can cause many complications. Being overweight or obese and having an inactive (sedentary) lifestyle can increase your risk for diabetes. Type 2 diabetes can be delayed or prevented by making certain nutrition and lifestyle changes. What nutrition changes can be made?   Eat healthy meals and snacks regularly. Keep a healthy snack with you for when you get hungry between meals, such as fruit or a handful of nuts.  Eat lean meats and proteins that are low in saturated fats, such as chicken, fish, egg whites, and beans. Avoid processed meats.  Eat plenty of fruits and vegetables and plenty of grains that have not been processed (whole grains). It is recommended that you eat: ? 1?2 cups of fruit every day. ? 2?3 cups of vegetables every day. ? 6?8 oz of whole grains every day, such as oats, whole wheat, bulgur, brown rice, quinoa, and millet.  Eat low-fat dairy products, such as milk, yogurt, and cheese.  Eat foods that contain healthy fats, such as nuts, avocado, olive oil, and canola oil.  Drink water throughout the day. Avoid drinks that contain added sugar, such as soda or sweet tea.   Follow instructions from your health care provider about specific eating or drinking restrictions.  Control how much food you eat at a time (portion size). ? Check food labels to find out the serving sizes of foods. ? Use a kitchen scale to weigh amounts of foods.  Saute or steam food instead of frying it. Cook with water or broth instead of oils or butter.  Limit your intake of: ? Salt (sodium). Have no more than 1 tsp (2,400 mg) of sodium a day. If you have heart disease or high blood pressure, have less than ? tsp (1,500 mg) of sodium a day. ? Saturated fat. This is fat that is solid at room temperature, such as butter or fat on meat. What lifestyle changes can be made? Activity   Do moderate-intensity physical activity for at least 30 minutes on at least 5 days of the week, or as much as told by your health care provider.  Ask your health care provider what activities are safe for you. A mix of physical activities may be best, such as walking, swimming, cycling, and strength training.  Try to add physical activity into your day. For example: ? Park in spots that are farther away than usual, so that you walk more. For example, park in a far corner of the parking lot when you go to the office or the grocery store. ? Take a walk during your lunch break. ? Use stairs instead of elevators or escalators. Weight Loss  Lose  weight as directed. Your health care provider can determine how much weight loss is best for you and can help you lose weight safely.  If you are overweight or obese, you may be instructed to lose at least 5?7 % of your body weight. Alcohol and Tobacco   Limit alcohol intake to no more than 1 drink a day for nonpregnant women and 2 drinks a day for men. One drink equals 12 oz of beer, 5 oz of wine, or 1 oz of hard liquor.  Do not use any tobacco products, such as cigarettes, chewing tobacco, and e-cigarettes. If you need help quitting, ask your health care  provider. Work With [facility] Provider  Have your blood glucose tested regularly, as told by your health care provider.  Discuss your risk factors and how you can reduce your risk for diabetes.  Get screening tests as told by your health care provider. You may have screening tests regularly, especially if you have certain risk factors for type 2 diabetes.  Make an appointment with a diet and nutrition specialist (registered dietitian). A registered dietitian can help you make a healthy eating plan and can help you understand portion sizes and food labels. Why are these changes important?  It is possible to prevent or delay type 2 diabetes and related health problems by making lifestyle and nutrition changes.  It can be difficult to recognize signs of type 2 diabetes. The best way to avoid possible damage to your body is to take actions to prevent the disease before you develop symptoms. What can happen if changes are not made?  Your blood glucose levels may keep increasing. Having high blood glucose for a long time is dangerous. Too much glucose in your blood can damage your blood vessels, heart, kidneys, nerves, and eyes.  You may develop prediabetes or type 2 diabetes. Type 2 diabetes can lead to many chronic health problems and complications, such as: ? Heart disease. ? Stroke. ? Blindness. ? Kidney disease. ? Depression. ? Poor circulation in the feet and legs, which could lead to surgical removal (amputation) in severe cases. Where to find support  Ask your health care provider to recommend a registered dietitian, diabetes educator, or weight loss program.  Look for local or online weight loss groups.  Join a gym, fitness club, or outdoor activity group, such as a walking club. Where to find more information To learn more about diabetes and diabetes prevention, visit:  American Diabetes Association (ADA): www.diabetes.[employer] of Diabetes and Digestive  and Kidney Diseases: FindSpin.nl To learn more about healthy eating, visit:  The U.S. Department of Agriculture Scientist, research (physical sciences)), Choose My Plate: http://wiley-williams.com/  Office of Disease Prevention and Health Promotion (ODPHP), Dietary Guidelines: SurferLive.at Summary  You can reduce your risk for type 2 diabetes by increasing your physical activity, eating healthy foods, and losing weight as directed.  Talk with your health care provider about your risk for type 2 diabetes. Ask about any blood tests or screening tests that you need to have. This information is not intended to replace advice given to you by your health care provider. Make sure you discuss any questions you have with your health care provider. Document Released: 04/25/2015 Document Revised: 04/25/2018 Document Reviewed: 02/22/2015 Elsevier Patient Education  2020 [employer].

## 2018-11-10 ENCOUNTER — Other Ambulatory Visit: Payer: Self-pay | Admitting: Internal Medicine

## 2018-12-16 ENCOUNTER — Encounter: Payer: Self-pay | Admitting: Neurology

## 2018-12-17 ENCOUNTER — Other Ambulatory Visit: Payer: Self-pay

## 2018-12-17 ENCOUNTER — Telehealth (INDEPENDENT_AMBULATORY_CARE_PROVIDER_SITE_OTHER): Payer: BC Managed Care – PPO | Admitting: Neurology

## 2018-12-17 VITALS — Ht 68.0 in | Wt 285.0 lb

## 2018-12-17 DIAGNOSIS — G44209 Tension-type headache, unspecified, not intractable: Secondary | ICD-10-CM

## 2018-12-17 DIAGNOSIS — M5412 Radiculopathy, cervical region: Secondary | ICD-10-CM

## 2018-12-17 NOTE — Progress Notes (Signed)
   Virtual Visit via Telephone Note The purpose of this virtual visit is to provide medical care while limiting exposure to the novel coronavirus.    Consent was obtained for telephone visit:  Yes.   Answered questions that patient had about telehealth interaction:  Yes.   I discussed the limitations, risks, security and privacy concerns of performing an evaluation and management service by telemedicine. I also discussed with the patient that there may be a patient responsible charge related to this service. The patient expressed understanding and agreed to proceed.  Pt location: Home Physician Location: office Name of referring provider:  Burnis Medin, MD I connected with Beth Wallace at patients initiation/request on 12/17/2018 at  8:50 AM EST by video enabled telemedicine application and verified that I am speaking with the correct person using two identifiers. Pt MRN:  [id number] Pt DOB:  23-Jan-1965 Video Participants:  Beth Wallace   History of Present Illness: This is a 53 y.o. female returning for follow-up of cervicogenic headaches and left cervical radiculopathy.  She was doing well and until about three weeks ago, when she developed acute onset of left sided neck pain.  She felt that symptoms may be related to having dental work done preceding pain.  Symptoms have singificantly improved with combination of home neck exercises and tizanidine 2 mg (moderate pain) and flexeril 5mg  (severe pain).  Tension headache occur very rarely.  Overall, she is feeling quite well and denies any new neurological complaints.    Assessment and Plan:  1.  Cervicalgia with left cervical radiculopathy, improved  -Continue muscle relaxants as needed - she takes methocarbamol 500 mg in the morning, OR, tizanidine 2 mg at bedtime (moderate pain), OR flexeril 5mg  at bedtime (severe pain)  2.  Tension headaches, markedly improved and responsive to NSAIDs as needed.    Follow Up  Instructions:   I discussed the assessment and treatment plan with the patient. The patient was provided an opportunity to ask questions and all were answered. The patient agreed with the plan and demonstrated an understanding of the instructions.   The patient was advised to call back or seek an in-person evaluation if the symptoms worsen or if the condition fails to improve as anticipated.  Follow-up in 9 months  Total time spent:  12 minutes     Alda Berthold, DO

## 2019-01-06 ENCOUNTER — Other Ambulatory Visit: Payer: Self-pay | Admitting: Internal Medicine

## 2019-01-13 ENCOUNTER — Encounter: Payer: BC Managed Care – PPO | Admitting: Internal Medicine

## 2019-01-26 ENCOUNTER — Encounter: Payer: Self-pay | Admitting: Internal Medicine

## 2019-01-26 ENCOUNTER — Other Ambulatory Visit: Payer: Self-pay

## 2019-01-26 ENCOUNTER — Ambulatory Visit (INDEPENDENT_AMBULATORY_CARE_PROVIDER_SITE_OTHER): Payer: BC Managed Care – PPO | Admitting: Internal Medicine

## 2019-01-26 VITALS — BP 124/80 | HR 76 | Temp 97.2°F | Ht 68.0 in | Wt 296.5 lb

## 2019-01-26 DIAGNOSIS — I10 Essential (primary) hypertension: Secondary | ICD-10-CM

## 2019-01-26 DIAGNOSIS — E786 Lipoprotein deficiency: Secondary | ICD-10-CM

## 2019-01-26 DIAGNOSIS — Z0000 Encounter for general adult medical examination without abnormal findings: Secondary | ICD-10-CM

## 2019-01-26 DIAGNOSIS — R7303 Prediabetes: Secondary | ICD-10-CM

## 2019-01-26 DIAGNOSIS — Z79899 Other long term (current) drug therapy: Secondary | ICD-10-CM

## 2019-01-26 DIAGNOSIS — Z720 Tobacco use: Secondary | ICD-10-CM

## 2019-01-26 LAB — CBC WITH DIFFERENTIAL/PLATELET
Basophils Absolute: 0 10*3/uL (ref 0.0–0.1)
Basophils Relative: 0.7 % (ref 0.0–3.0)
Eosinophils Absolute: 0.1 10*3/uL (ref 0.0–0.7)
Eosinophils Relative: 1.8 % (ref 0.0–5.0)
HCT: 42.1 % (ref 36.0–46.0)
Hemoglobin: 14 g/dL (ref 12.0–15.0)
Lymphocytes Relative: 34.2 % (ref 12.0–46.0)
Lymphs Abs: 1.9 10*3/uL (ref 0.7–4.0)
MCHC: 33.2 g/dL (ref 30.0–36.0)
MCV: 87.6 fl (ref 78.0–100.0)
Monocytes Absolute: 0.4 10*3/uL (ref 0.1–1.0)
Monocytes Relative: 6.8 % (ref 3.0–12.0)
Neutro Abs: 3.1 10*3/uL (ref 1.4–7.7)
Neutrophils Relative %: 56.5 % (ref 43.0–77.0)
Platelets: 330 10*3/uL (ref 150.0–400.0)
RBC: 4.8 Mil/uL (ref 3.87–5.11)
RDW: 15.2 % (ref 11.5–15.5)
WBC: 5.4 10*3/uL (ref 4.0–10.5)

## 2019-01-26 LAB — TSH: TSH: 1.11 u[IU]/mL (ref 0.35–4.50)

## 2019-01-26 LAB — HEPATIC FUNCTION PANEL
ALT: 23 U/L (ref 0–35)
AST: 20 U/L (ref 0–37)
Albumin: 4.3 g/dL (ref 3.5–5.2)
Alkaline Phosphatase: 74 U/L (ref 39–117)
Bilirubin, Direct: 0.1 mg/dL (ref 0.0–0.3)
Total Bilirubin: 0.6 mg/dL (ref 0.2–1.2)
Total Protein: 7.4 g/dL (ref 6.0–8.3)

## 2019-01-26 LAB — BASIC METABOLIC PANEL
BUN: 13 mg/dL (ref 6–23)
CO2: 28 mEq/L (ref 19–32)
Calcium: 9.6 mg/dL (ref 8.4–10.5)
Chloride: 102 mEq/L (ref 96–112)
Creatinine, Ser: 0.74 mg/dL (ref 0.40–1.20)
GFR: 99.91 mL/min (ref >60.00)
Glucose, Bld: 112 mg/dL — ABNORMAL HIGH (ref 70–99)
Potassium: 3.7 mEq/L (ref 3.5–5.1)
Sodium: 141 mEq/L (ref 135–145)

## 2019-01-26 LAB — LIPID PANEL
Cholesterol: 208 mg/dL — ABNORMAL HIGH (ref 0–200)
HDL: 34.7 mg/dL — ABNORMAL LOW (ref >39.00)
LDL Cholesterol: 139 mg/dL — ABNORMAL HIGH (ref 0–99)
NonHDL: 173.74
Total CHOL/HDL Ratio: 6
Triglycerides: 176 mg/dL — ABNORMAL HIGH (ref 0.0–149.0)
VLDL: 35.2 mg/dL (ref 0.0–40.0)

## 2019-01-26 LAB — HEMOGLOBIN A1C: Hgb A1c MFr Bld: 6.6 % — ABNORMAL HIGH (ref 4.6–6.5)

## 2019-01-26 NOTE — Patient Instructions (Addendum)
Stop tobacco Get weight back down as you have been  doing  increase activity of any  Sorts .    Will notify you  of labs when available.   Fu depending on results  ? 6 months    Health Maintenance, Female Adopting a healthy lifestyle and getting preventive care are important in promoting health and wellness. Ask your health care provider about:  The right schedule for you to have regular tests and exams.  Things you can do on your own to prevent diseases and keep yourself healthy. What should I know about diet, weight, and exercise? Eat a healthy diet   Eat a diet that includes plenty of vegetables, fruits, low-fat dairy products, and lean protein.  Do not eat a lot of foods that are high in solid fats, added sugars, or sodium. Maintain a healthy weight Body mass index (BMI) is used to identify weight problems. It estimates body fat based on height and weight. Your health care provider can help determine your BMI and help you achieve or maintain a healthy weight. Get regular exercise Get regular exercise. This is one of the most important things you can do for your health. Most adults should:  Exercise for at least 150 minutes each week. The exercise should increase your heart rate and make you sweat (moderate-intensity exercise).  Do strengthening exercises at least twice a week. This is in addition to the moderate-intensity exercise.  Spend less time sitting. Even light physical activity can be beneficial. Watch cholesterol and blood lipids Have your blood tested for lipids and cholesterol at 52 years of age, then have this test every 5 years. Have your cholesterol levels checked more often if:  Your lipid or cholesterol levels are high.  You are older than 52 years of age.  You are at high risk for heart disease. What should I know about cancer screening? Depending on your health history and family history, you may need to have cancer screening at various ages. This may  include screening for:  Breast cancer.  Cervical cancer.  Colorectal cancer.  Skin cancer.  Lung cancer. What should I know about heart disease, diabetes, and high blood pressure? Blood pressure and heart disease  High blood pressure causes heart disease and increases the risk of stroke. This is more likely to develop in people who have high blood pressure readings, are of African descent, or are overweight.  Have your blood pressure checked: ? Every 3-5 years if you are 20-65 years of age. ? Every year if you are 24 years old or older. Diabetes Have regular diabetes screenings. This checks your fasting blood sugar level. Have the screening done:  Once every three years after age 1 if you are at a normal weight and have a low risk for diabetes.  More often and at a younger age if you are overweight or have a high risk for diabetes. What should I know about preventing infection? Hepatitis B If you have a higher risk for hepatitis B, you should be screened for this virus. Talk with your health care provider to find out if you are at risk for hepatitis B infection. Hepatitis C Testing is recommended for:  Everyone born from [birth year] through 1963.  Anyone with known risk factors for hepatitis C. Sexually transmitted infections (STIs)  Get screened for STIs, including gonorrhea and chlamydia, if: ? You are sexually active and are younger than 52 years of age. ? You are older than 52 years of age  and your health care provider tells you that you are at risk for this type of infection. ? Your sexual activity has changed since you were last screened, and you are at increased risk for chlamydia or gonorrhea. Ask your health care provider if you are at risk.  Ask your health care provider about whether you are at high risk for HIV. Your health care provider may recommend a prescription medicine to help prevent HIV infection. If you choose to take medicine to prevent HIV, you should first  get tested for HIV. You should then be tested every 3 months for as long as you are taking the medicine. Pregnancy  If you are about to stop having your period (premenopausal) and you may become pregnant, seek counseling before you get pregnant.  Take 400 to 800 micrograms (mcg) of folic acid every day if you become pregnant.  Ask for birth control (contraception) if you want to prevent pregnancy. Osteoporosis and menopause Osteoporosis is a disease in which the bones lose minerals and strength with aging. This can result in bone fractures. If you are 85 years old or older, or if you are at risk for osteoporosis and fractures, ask your health care provider if you should:  Be screened for bone loss.  Take a calcium or vitamin D supplement to lower your risk of fractures.  Be given hormone replacement therapy (HRT) to treat symptoms of menopause. Follow these instructions at home: Lifestyle  Do not use any products that contain nicotine or tobacco, such as cigarettes, e-cigarettes, and chewing tobacco. If you need help quitting, ask your health care provider.  Do not use street drugs.  Do not share needles.  Ask your health care provider for help if you need support or information about quitting drugs. Alcohol use  Do not drink alcohol if: ? Your health care provider tells you not to drink. ? You are pregnant, may be pregnant, or are planning to become pregnant.  If you drink alcohol: ? Limit how much you use to 0-1 drink a day. ? Limit intake if you are breastfeeding.  Be aware of how much alcohol is in your drink. In the [place] one drink equals one 12 oz bottle of beer (355 mL), one 5 oz glass of wine (148 mL), or one 1 oz glass of hard liquor (44 mL). General instructions  Schedule regular health, dental, and eye exams.  Stay current with your vaccines.  Tell your health care provider if: ? You often feel depressed. ? You have ever been abused or do not feel safe at  home. Summary  Adopting a healthy lifestyle and getting preventive care are important in promoting health and wellness.  Follow your health care provider's instructions about healthy diet, exercising, and getting tested or screened for diseases.  Follow your health care provider's instructions on monitoring your cholesterol and blood pressure. This information is not intended to replace advice given to you by your health care provider. Make sure you discuss any questions you have with your health care provider. Document Revised: 12/25/2017 Document Reviewed: 12/25/2017 Elsevier Patient Education  2020 [employer].

## 2019-01-26 NOTE — Progress Notes (Signed)
This visit occurred during the SARS-CoV-2 public health emergency.  Safety protocols were in place, including screening questions prior to the visit, additional usage of staff PPE, and extensive cleaning of exam room while observing appropriate contact time as indicated for disinfecting solutions.    Chief Complaint  Patient presents with  . Annual Exam    HPI: Patient  Beth Wallace  54 y.o. comes in today for Preventive Health Care visit  And  Chronic disease management BP has been good. Gained some weight over holidays   But back on track doing weight wathers.  Interested I a1c  Get ocass light headed funny taste in mouth  No vision neuro sx  Concerned  about husbands heath  Health Maintenance  Topic Date Due  . HIV Screening  07/21/1982  . MAMMOGRAM  12/15/2017  . TETANUS/TDAP  05/16/2019  . PAP SMEAR-Modifier  12/16/2019  . COLONOSCOPY  02/25/2028  . INFLUENZA VACCINE  Completed   Health Maintenance Review LIFESTYLE:  Exercise:  Inactive works from home Tobacco/ETS:3-4 oper day Alcohol: n Sugar beverages:n Sleep:yes ok Drug use: no HH of 2 Work:50 + hours at home for a while    ROS: see above  Had gyne exam and breast pap smear ok  GEN/ HEENT: No fever, significant weight changes sweats headaches vision problems hearing changes, CV/ PULM; No chest pain shortness of breath cough, syncope,edema  change in exercise tolerance. GI /GU: No adominal pain, vomiting, change in bowel habits. No blood in the stool. No significant GU symptoms. SKIN/HEME: ,no acute skin rashes suspicious lesions or bleeding. No lymphadenopathy, nodules, masses.  NEURO/ PSYCH:  No neurologic signs such as weakness numbness. No depression anxiety. IMM/ Allergy: No unusual infections.  Allergy .   REST of 12 system review negative except as per HPI   Past Medical History:  Diagnosis Date  . ADJ DISORDER WITH MIXED ANXIETY & DEPRESSED MOOD 12/14/2009   Qualifier: Diagnosis of  By:  Regis Bill MD, Standley Brooking   . Allergic reaction 05/18/2011  . Allergy    SEASONAL   . Anxiety   . Diabetes mellitus without complication ([place])    pre diabetic- not started Metformin - diet and exercise to control   . GERD (gastroesophageal reflux disease)   . Heart murmur    slight years ago   . History of gallstones   . HTN (hypertension)   . Metrorrhagia 12/14/2009   Qualifier: Diagnosis of  By: Regis Bill MD, Standley Brooking With pelvic pain  Sees Dr Philis Pique and tobotic hysterectomy scheduled   . Neuromuscular disorder ([place])    cervical issues to arms  . Obesity   . Rapid palpitations 01/23/2012   Probably related to stress had negative cardiac workup in the past rule out metabolic   . TMJ (temporomandibular joint disorder)     Past Surgical History:  Procedure Laterality Date  . ABDOMINAL HYSTERECTOMY  09/2010   partial  . CESAREAN SECTION    . CHOLECYSTECTOMY  1989  . UPPER GASTROINTESTINAL ENDOSCOPY    . WISDOM TOOTH EXTRACTION  2010    Family History  Problem Relation Age of Onset  . Heart disease Mother 56       Died age 51, MI  . Heart attack Mother   . Hypertension Mother   . Hypertension Father        Living  . Diabetes Father   . Heart disease Father   . Other Sister        Killed, age 35  .  Other Daughter        Killed, age 9  . Colon polyps Maternal Aunt   . Hyperlipidemia Neg Hx   . Sudden death Neg Hx   . Colon cancer Neg Hx   . Esophageal cancer Neg Hx   . Rectal cancer Neg Hx   . Stomach cancer Neg Hx       Outpatient Medications Prior to Visit  Medication Sig Dispense Refill  . ALPRAZolam (XANAX) 0.5 MG tablet TAKE 1 TABLET BY MOUTH 3 TIMES A DAY AS NEEDED FOR ANXIETY.AVOID REGULAR USE 24 tablet 0  . azelastine (ASTELIN) 0.1 % nasal spray 1 2 PUFF IN EACH NOSTRIL TWICE A DAY AS NEEDED NASALLY 30 DAY(S)    . Azelastine-Fluticasone (DYMISTA) 137-50 MCG/ACT SUSP Dymista 137 mcg-50 mcg/spray nasal spray    . cetirizine (ZYRTEC) 10 MG tablet Take 10 mg by mouth at  bedtime.    . cyclobenzaprine (FLEXERIL) 5 MG tablet Take 5 mg by mouth at bedtime as needed for muscle spasms (neck pain (severe)).    . diclofenac sodium (VOLTAREN) 1 % GEL Apply 4 g topically 4 (four) times daily. For pain 4 Tube 1  . fexofenadine (ALLEGRA) 180 MG tablet Take 180 mg by mouth daily.    . fluticasone (FLONASE) 50 MCG/ACT nasal spray Place 2 sprays into both nostrils daily.    . hydrOXYzine (ATARAX/VISTARIL) 10 MG tablet Take 10 mg by mouth 3 (three) times daily as needed for itching.    Marland Kitchen ibuprofen (ADVIL,MOTRIN) 800 MG tablet TAKE 1 TABLET BY MOUTH EVERY 8 HOURS AS NEEDED FOR CRAMPS 30 tablet 2  . lisinopril-hydrochlorothiazide (ZESTORETIC) 20-25 MG tablet TAKE 1 TABLET BY MOUTH EVERY DAY 90 tablet 3  . methocarbamol (ROBAXIN) 500 MG tablet Take 500 mg by mouth daily as needed for muscle spasms (Neck pain).    . montelukast (SINGULAIR) 10 MG tablet Take 10 mg by mouth at bedtime.    . naproxen (NAPROSYN) 500 MG tablet TAKE 1 TABLET BY MOUTH EVERY 12 (TWELVE) HOURS AS NEEDED FOR MILD PAIN (NECK PAIN). 60 tablet 1  . omeprazole (PRILOSEC) 20 MG capsule TAKE 1 CAPSULE BY MOUTH EVERY DAY 90 capsule 0  . phentermine 30 MG capsule TAKE 1 CAPSULE BY MOUTH EVERY DAY IN THE MORNING 30 capsule 1  . tiZANidine (ZANAFLEX) 2 MG tablet Take 1 tablet (2 mg total) by mouth at bedtime as needed for muscle spasms. 30 tablet 5  . metFORMIN (GLUCOPHAGE) 500 MG tablet Take 1 tablet (500 mg total) by mouth daily with breakfast. Increase to twice a day after 3 weeks  Or as directed (Patient not taking: Reported on 01/26/2019) 180 tablet 1   No facility-administered medications prior to visit.     EXAM:  BP 124/80 (BP Location: Left Arm, Patient Position: Sitting, Cuff Size: Large)   Pulse 76   Temp (!) 97.2 F (36.2 C) (Temporal)   Ht 5\' 8"  (1.727 m)   Wt 296 lb 8 oz (134.5 kg)   SpO2 98%   BMI 45.08 kg/m   Body mass index is 45.08 kg/m. Wt Readings from Last 3 Encounters:  01/26/19 296  lb 8 oz (134.5 kg)  12/16/18 285 lb (129.3 kg)  10/28/18 (!) 304 lb 3.2 oz (138 kg)    Physical Exam: Vital signs reviewed [id number] is a well-developed well-nourished alert cooperative    who appearsr stated age in no acute distress.  HEENT: normocephalic atraumatic , Eyes: PERRL EOM's full, conjunctiva clear, s., Ears: no  deformity EAC's clear TMs with normal landmarks. Mouthmasked  NECK: supple without masses, thyromegaly or bruits. CHEST/PULM:  Clear to auscultation and percussion breath sounds equal no wheeze , rales or rhonchi. No chest wall deformities or tenderness. Breast: done by gyne  CV: PMI is nondisplaced, S1 S2 no gallops, murmurs, rubs. Peripheral pulses are full without delay.No JVD .  ABDOMEN: Bowel sounds normal nontender  No guard or rebound, no hepato splenomegal no CVA tenderness.   Extremtities:  No clubbing cyanosis or edema, no acute joint swelling or redness no focal atrophy NEURO:  Oriented x3, cranial nerves 3-12 appear to be intact, no obvious focal weakness,gait within normal limits no abnormal reflexes or asymmetrical SKIN: No acute rashes normal turgor, color, no bruising or petechiae. PSYCH: Oriented, good eye contact, no obvious depression anxiety, cognition and judgment appear normal. LN: no cervical axillary adenopathy  Lab Results  Component Value Date   WBC 5.7 06/12/2017   HGB 14.4 06/12/2017   HCT 42.4 06/12/2017   PLT 319.0 06/12/2017   GLUCOSE 117 (H) 01/06/2018   CHOL 197 01/06/2018   TRIG 109.0 01/06/2018   HDL 35.80 (L) 01/06/2018   LDLCALC 139 (H) 01/06/2018   ALT 24 06/12/2017   AST 21 06/12/2017   NA 138 01/06/2018   K 4.5 01/06/2018   CL 101 01/06/2018   CREATININE 0.71 01/06/2018   BUN 15 01/06/2018   CO2 30 01/06/2018   TSH 0.88 06/12/2017   HGBA1C 6.4 (A) 10/28/2018   MICROALBUR <0.7 11/19/2014    BP Readings from Last 3 Encounters:  01/26/19 124/80  10/28/18 122/74  06/13/18 125/88   Wt Readings from Last 3  Encounters:  01/26/19 296 lb 8 oz (134.5 kg)  12/16/18 285 lb (129.3 kg)  10/28/18 (!) 304 lb 3.2 oz (138 kg)     Lab plan   And fasting lab today   ASSESSMENT AND PLAN:  Discussed the following assessment and plan:    ICD-10-CM   1. Visit for preventive health examination  123456 Basic metabolic panel    CBC with Differential    Hemoglobin A1c    Lipid panel    TSH    Hepatic function panel  2. Medication management  123456 Basic metabolic panel    CBC with Differential    Hemoglobin A1c    Lipid panel    TSH    Hepatic function panel  3. Prediabetes  [id number] Basic metabolic panel    CBC with Differential    Hemoglobin A1c    Lipid panel    TSH    Hepatic function panel  4. Essential hypertension  [id number] Basic metabolic panel    CBC with Differential    Hemoglobin A1c    Lipid panel    TSH    Hepatic function panel  5. Low HDL (under 40)  [id number] Basic metabolic panel    CBC with Differential    Hemoglobin A1c    Lipid panel    TSH    Hepatic function panel  6. Tobacco use  Z72.0   7. Morbid obesity, unspecified obesity type ([place])  E66.01    utd on pv ?  Disc weight  Metabolic management and meds     Stop the tobacco again .   Patient Care Team: Briar Witherspoon, Standley Brooking, MD as PCP - General Ladene Artist, MD as Attending Physician (Gastroenterology) Alda Berthold, DO as Consulting Physician (Neurology) Patient Instructions  Stop tobacco Get weight back down as you have  been  doing  increase activity of any  Sorts .    Will notify you  of labs when available.   Fu depending on results  ? 6 months    Health Maintenance, Female Adopting a healthy lifestyle and getting preventive care are important in promoting health and wellness. Ask your health care provider about:  The right schedule for you to have regular tests and exams.  Things you can do on your own to prevent diseases and keep yourself healthy. What should I know about diet, weight, and  exercise? Eat a healthy diet   Eat a diet that includes plenty of vegetables, fruits, low-fat dairy products, and lean protein.  Do not eat a lot of foods that are high in solid fats, added sugars, or sodium. Maintain a healthy weight Body mass index (BMI) is used to identify weight problems. It estimates body fat based on height and weight. Your health care provider can help determine your BMI and help you achieve or maintain a healthy weight. Get regular exercise Get regular exercise. This is one of the most important things you can do for your health. Most adults should:  Exercise for at least 150 minutes each week. The exercise should increase your heart rate and make you sweat (moderate-intensity exercise).  Do strengthening exercises at least twice a week. This is in addition to the moderate-intensity exercise.  Spend less time sitting. Even light physical activity can be beneficial. Watch cholesterol and blood lipids Have your blood tested for lipids and cholesterol at 52 years of age, then have this test every 5 years. Have your cholesterol levels checked more often if:  Your lipid or cholesterol levels are high.  You are older than 52 years of age.  You are at high risk for heart disease. What should I know about cancer screening? Depending on your health history and family history, you may need to have cancer screening at various ages. This may include screening for:  Breast cancer.  Cervical cancer.  Colorectal cancer.  Skin cancer.  Lung cancer. What should I know about heart disease, diabetes, and high blood pressure? Blood pressure and heart disease  High blood pressure causes heart disease and increases the risk of stroke. This is more likely to develop in people who have high blood pressure readings, are of African descent, or are overweight.  Have your blood pressure checked: ? Every 3-5 years if you are 67-49 years of age. ? Every year if you are 13  years old or older. Diabetes Have regular diabetes screenings. This checks your fasting blood sugar level. Have the screening done:  Once every three years after age 43 if you are at a normal weight and have a low risk for diabetes.  More often and at a younger age if you are overweight or have a high risk for diabetes. What should I know about preventing infection? Hepatitis B If you have a higher risk for hepatitis B, you should be screened for this virus. Talk with your health care provider to find out if you are at risk for hepatitis B infection. Hepatitis C Testing is recommended for:  Everyone born from [birth year] through 1963.  Anyone with known risk factors for hepatitis C. Sexually transmitted infections (STIs)  Get screened for STIs, including gonorrhea and chlamydia, if: ? You are sexually active and are younger than 52 years of age. ? You are older than 52 years of age and your health care provider tells you that  you are at risk for this type of infection. ? Your sexual activity has changed since you were last screened, and you are at increased risk for chlamydia or gonorrhea. Ask your health care provider if you are at risk.  Ask your health care provider about whether you are at high risk for HIV. Your health care provider may recommend a prescription medicine to help prevent HIV infection. If you choose to take medicine to prevent HIV, you should first get tested for HIV. You should then be tested every 3 months for as long as you are taking the medicine. Pregnancy  If you are about to stop having your period (premenopausal) and you may become pregnant, seek counseling before you get pregnant.  Take 400 to 800 micrograms (mcg) of folic acid every day if you become pregnant.  Ask for birth control (contraception) if you want to prevent pregnancy. Osteoporosis and menopause Osteoporosis is a disease in which the bones lose minerals and strength with aging. This can result in  bone fractures. If you are 16 years old or older, or if you are at risk for osteoporosis and fractures, ask your health care provider if you should:  Be screened for bone loss.  Take a calcium or vitamin D supplement to lower your risk of fractures.  Be given hormone replacement therapy (HRT) to treat symptoms of menopause. Follow these instructions at home: Lifestyle  Do not use any products that contain nicotine or tobacco, such as cigarettes, e-cigarettes, and chewing tobacco. If you need help quitting, ask your health care provider.  Do not use street drugs.  Do not share needles.  Ask your health care provider for help if you need support or information about quitting drugs. Alcohol use  Do not drink alcohol if: ? Your health care provider tells you not to drink. ? You are pregnant, may be pregnant, or are planning to become pregnant.  If you drink alcohol: ? Limit how much you use to 0-1 drink a day. ? Limit intake if you are breastfeeding.  Be aware of how much alcohol is in your drink. In the [place] one drink equals one 12 oz bottle of beer (355 mL), one 5 oz glass of wine (148 mL), or one 1 oz glass of hard liquor (44 mL). General instructions  Schedule regular health, dental, and eye exams.  Stay current with your vaccines.  Tell your health care provider if: ? You often feel depressed. ? You have ever been abused or do not feel safe at home. Summary  Adopting a healthy lifestyle and getting preventive care are important in promoting health and wellness.  Follow your health care provider's instructions about healthy diet, exercising, and getting tested or screened for diseases.  Follow your health care provider's instructions on monitoring your cholesterol and blood pressure. This information is not intended to replace advice given to you by your health care provider. Make sure you discuss any questions you have with your health care provider. Document Revised:  12/25/2017 Document Reviewed: 12/25/2017 Elsevier Patient Education  2020 [place]iah Braley M.D.

## 2019-01-27 ENCOUNTER — Other Ambulatory Visit: Payer: Self-pay

## 2019-01-27 DIAGNOSIS — E785 Hyperlipidemia, unspecified: Secondary | ICD-10-CM

## 2019-01-27 DIAGNOSIS — R7303 Prediabetes: Secondary | ICD-10-CM

## 2019-01-27 NOTE — Progress Notes (Signed)
Blood sugar about the same  early disables vs pre diabetes range  cholesterol jot as good  we should consider  adding cholesterol  medicine to get  better numbers  but if you want to work on this    we can recheck lipid and hg a1c in 5-6 months and then fu Video ok  to decide on  other rx . [place] can you place orders for future labs

## 2019-02-18 ENCOUNTER — Other Ambulatory Visit: Payer: Self-pay | Admitting: Internal Medicine

## 2019-02-19 NOTE — Telephone Encounter (Signed)
Last ov:01/26/19 Last filled:10/23/2018

## 2019-04-22 ENCOUNTER — Other Ambulatory Visit: Payer: Self-pay | Admitting: Internal Medicine

## 2019-04-25 ENCOUNTER — Other Ambulatory Visit: Payer: Self-pay | Admitting: Internal Medicine

## 2019-04-27 NOTE — Telephone Encounter (Signed)
Last OV 01/26/2019  Last filled 10/28/2018, # 30 with 1 refill

## 2019-04-27 NOTE — Telephone Encounter (Signed)
Sent in electronically .  Please arrange  Remind  Follow up   Lab orders in system   See below    Salvatore Poe, Standley Brooking, MD  01/27/2019 12:39 PM EST    Blood sugar about the same early disables vs pre diabetes range cholesterol jot as good we should consider adding cholesterol medicine to get better numbers but if you want to work on this  we can recheck lipid and hg a1c in 5-6 months and then fu Video ok to decide on other rx . [place] can you place orders for future labs

## 2019-04-28 NOTE — Telephone Encounter (Signed)
Called patient and scheduled a lab appointment and a virtual visit for patient. Patient verbalized an understanding.

## 2019-05-25 ENCOUNTER — Other Ambulatory Visit: Payer: BC Managed Care – PPO

## 2019-05-28 ENCOUNTER — Telehealth: Payer: BC Managed Care – PPO | Admitting: Internal Medicine

## 2019-06-03 ENCOUNTER — Other Ambulatory Visit: Payer: Self-pay

## 2019-06-03 ENCOUNTER — Other Ambulatory Visit (INDEPENDENT_AMBULATORY_CARE_PROVIDER_SITE_OTHER): Payer: BC Managed Care – PPO

## 2019-06-03 DIAGNOSIS — E785 Hyperlipidemia, unspecified: Secondary | ICD-10-CM | POA: Diagnosis not present

## 2019-06-03 DIAGNOSIS — R7303 Prediabetes: Secondary | ICD-10-CM

## 2019-06-03 LAB — LIPID PANEL
Cholesterol: 204 mg/dL — ABNORMAL HIGH (ref 0–200)
HDL: 33.9 mg/dL — ABNORMAL LOW (ref >39.00)
LDL Cholesterol: 147 mg/dL — ABNORMAL HIGH (ref 0–99)
NonHDL: 169.87
Total CHOL/HDL Ratio: 6
Triglycerides: 112 mg/dL (ref 0.0–149.0)
VLDL: 22.4 mg/dL (ref 0.0–40.0)

## 2019-06-03 LAB — HEMOGLOBIN A1C: Hgb A1c MFr Bld: 6.4 % (ref 4.6–6.5)

## 2019-06-05 NOTE — Progress Notes (Signed)
Cholesterol about the same  a 1c slightly better   Will discuss at upcoming visit  May 24

## 2019-06-07 ENCOUNTER — Other Ambulatory Visit: Payer: Self-pay | Admitting: Internal Medicine

## 2019-06-08 ENCOUNTER — Other Ambulatory Visit: Payer: Self-pay

## 2019-06-08 ENCOUNTER — Telehealth (INDEPENDENT_AMBULATORY_CARE_PROVIDER_SITE_OTHER): Payer: BC Managed Care – PPO | Admitting: Internal Medicine

## 2019-06-08 ENCOUNTER — Encounter: Payer: Self-pay | Admitting: Internal Medicine

## 2019-06-08 VITALS — Ht 68.0 in | Wt 292.0 lb

## 2019-06-08 DIAGNOSIS — Z79899 Other long term (current) drug therapy: Secondary | ICD-10-CM | POA: Diagnosis not present

## 2019-06-08 DIAGNOSIS — R7303 Prediabetes: Secondary | ICD-10-CM

## 2019-06-08 DIAGNOSIS — E785 Hyperlipidemia, unspecified: Secondary | ICD-10-CM

## 2019-06-08 MED ORDER — ROSUVASTATIN CALCIUM 10 MG PO TABS
10.0000 mg | ORAL_TABLET | Freq: Every day | ORAL | 1 refills | Status: DC
Start: 2019-06-08 — End: 2019-11-30

## 2019-06-08 NOTE — Progress Notes (Signed)
Virtual Visit via Video Note  I connected with@ on 06/08/19 at  8:30 AM EDT by a video enabled telemedicine application and verified that I am speaking with the correct person using two identifiers. Location patient: home Location provider:work  office Persons participating in the virtual visit: patient, provider  [place] national recommendations  regarding COVID 19 pandemic   video visit is advised over in office visit for this patient.  Patient aware  of the limitations of evaluation and management by telemedicine and  availability of in person appointments. and agreed to proceed.   HPI: Beth Wallace presents for video visit  Fu bg and lipids Taking one metformin mer day Just restarted p[hentermine for a few days  Has lost 4 # Trying to be active still wokring on it  Had covid vaccine series   Moderna.   ROS: See pertinent positives and negatives per HPI.v  Past Medical History:  Diagnosis Date  . ADJ DISORDER WITH MIXED ANXIETY & DEPRESSED MOOD 12/14/2009   Qualifier: Diagnosis of  By: Regis Bill MD, Standley Brooking   . Allergic reaction 05/18/2011  . Allergy    SEASONAL   . Anxiety   . Diabetes mellitus without complication ([place])    pre diabetic- not started Metformin - diet and exercise to control   . GERD (gastroesophageal reflux disease)   . Heart murmur    slight years ago   . History of gallstones   . HTN (hypertension)   . Metrorrhagia 12/14/2009   Qualifier: Diagnosis of  By: Regis Bill MD, Standley Brooking With pelvic pain  Sees Dr Philis Pique and tobotic hysterectomy scheduled   . Neuromuscular disorder ([place])    cervical issues to arms  . Obesity   . Rapid palpitations 01/23/2012   Probably related to stress had negative cardiac workup in the past rule out metabolic   . TMJ (temporomandibular joint disorder)     Past Surgical History:  Procedure Laterality Date  . ABDOMINAL HYSTERECTOMY  09/2010   partial  . CESAREAN SECTION    . CHOLECYSTECTOMY  1989  . UPPER GASTROINTESTINAL  ENDOSCOPY    . WISDOM TOOTH EXTRACTION  2010    Family History  Problem Relation Age of Onset  . Heart disease Mother 78       Died age 84, MI  . Heart attack Mother   . Hypertension Mother   . Hypertension Father        Living  . Diabetes Father   . Heart disease Father   . Other Sister        Killed, age 68  . Other Daughter        Killed, age 29  . Colon polyps Maternal Aunt   . Hyperlipidemia Neg Hx   . Sudden death Neg Hx   . Colon cancer Neg Hx   . Esophageal cancer Neg Hx   . Rectal cancer Neg Hx   . Stomach cancer Neg Hx     Social History   Tobacco Use  . Smoking status: Current Some Day Smoker    Packs/day: 0.25    Types: Cigarettes    Last attempt to quit: 01/22/2012    Years since quitting: 7.3  . Smokeless tobacco: Never Used  . Tobacco comment: 1-2 cigarretes daily  Substance Use Topics  . Alcohol use: No    Alcohol/week: 0.0 standard drinks  . Drug use: No      Current Outpatient Medications:  .  ALPRAZolam (XANAX) 0.5 MG tablet, TAKE 1 TABLET  BY MOUTH 3 TIMES A DAY AS NEEDED FOR ANXIETY.AVOID REGULAR USE, Disp: 24 tablet, Rfl: 0 .  azelastine (ASTELIN) 0.1 % nasal spray, 1 2 PUFF IN EACH NOSTRIL TWICE A DAY AS NEEDED NASALLY 30 DAY(S), Disp: , Rfl:  .  Azelastine-Fluticasone (DYMISTA) 137-50 MCG/ACT SUSP, Dymista 137 mcg-50 mcg/spray nasal spray, Disp: , Rfl:  .  cetirizine (ZYRTEC) 10 MG tablet, Take 10 mg by mouth at bedtime., Disp: , Rfl:  .  chlorhexidine (PERIDEX) 0.12 % solution, 15 mLs 2 (two) times daily., Disp: , Rfl:  .  cyclobenzaprine (FLEXERIL) 5 MG tablet, Take 5 mg by mouth at bedtime as needed for muscle spasms (neck pain (severe))., Disp: , Rfl:  .  diclofenac sodium (VOLTAREN) 1 % GEL, Apply 4 g topically 4 (four) times daily. For pain, Disp: 4 Tube, Rfl: 1 .  fexofenadine (ALLEGRA) 180 MG tablet, Take 180 mg by mouth daily., Disp: , Rfl:  .  fluticasone (FLONASE) 50 MCG/ACT nasal spray, Place 2 sprays into both nostrils daily.,  Disp: , Rfl:  .  hydrOXYzine (ATARAX/VISTARIL) 10 MG tablet, Take 10 mg by mouth 3 (three) times daily as needed for itching., Disp: , Rfl:  .  ibuprofen (ADVIL,MOTRIN) 800 MG tablet, TAKE 1 TABLET BY MOUTH EVERY 8 HOURS AS NEEDED FOR CRAMPS, Disp: 30 tablet, Rfl: 2 .  lisinopril-hydrochlorothiazide (ZESTORETIC) 20-25 MG tablet, TAKE 1 TABLET BY MOUTH EVERY DAY, Disp: 90 tablet, Rfl: 3 .  metFORMIN (GLUCOPHAGE) 500 MG tablet, TAKE 1 TABLET (500 MG TOTAL) BY MOUTH DAILY WITH BREAKFAST. INCREASE TO TWICE A DAY AFTER 3 WEEKS OR AS DIRECTED, Disp: 180 tablet, Rfl: 1 .  methocarbamol (ROBAXIN) 500 MG tablet, Take 500 mg by mouth daily as needed for muscle spasms (Neck pain)., Disp: , Rfl:  .  montelukast (SINGULAIR) 10 MG tablet, Take 10 mg by mouth at bedtime., Disp: , Rfl:  .  naproxen (NAPROSYN) 500 MG tablet, TAKE 1 TABLET BY MOUTH EVERY 12 (TWELVE) HOURS AS NEEDED FOR MILD PAIN (NECK PAIN)., Disp: 60 tablet, Rfl: 1 .  omeprazole (PRILOSEC) 20 MG capsule, TAKE 1 CAPSULE BY MOUTH EVERY DAY, Disp: 90 capsule, Rfl: 2 .  phentermine 30 MG capsule, TAKE 1 CAPSULE BY MOUTH EVERY DAY IN THE MORNING, Disp: 30 capsule, Rfl: 1 .  tiZANidine (ZANAFLEX) 2 MG tablet, Take 1 tablet (2 mg total) by mouth at bedtime as needed for muscle spasms., Disp: 30 tablet, Rfl: 5 .  rosuvastatin (CRESTOR) 10 MG tablet, Take 1 tablet (10 mg total) by mouth daily., Disp: 90 tablet, Rfl: 1  EXAM: BP Readings from Last 3 Encounters:  01/26/19 124/80  10/28/18 122/74  06/13/18 125/88    VITALS per patient if applicable: GENERAL: alert, oriented, appears well and in no acute distressHEENT: atraumatic, conjunttiva clear, no obvious abnormalities on inspection of external nose and ears  NECK: normal movements of the head and neck LUNGS: on inspection no signs of respiratory distress, breathing rate appears normal, no obvious gross SOB, gasping or wheezing CV: no obvious cyanosi  PSYCH/NEURO: pleasant and cooperative, no  obvious depression or anxiety, speech and thought processing grossly intact Lab Results  Component Value Date   WBC 5.4 01/26/2019   HGB 14.0 01/26/2019   HCT 42.1 01/26/2019   PLT 330.0 01/26/2019   GLUCOSE 112 (H) 01/26/2019   CHOL 204 (H) 06/03/2019   TRIG 112.0 06/03/2019   HDL 33.90 (L) 06/03/2019   LDLCALC 147 (H) 06/03/2019   ALT 23 01/26/2019   AST  20 01/26/2019   NA 141 01/26/2019   K 3.7 01/26/2019   CL 102 01/26/2019   CREATININE 0.74 01/26/2019   BUN 13 01/26/2019   CO2 28 01/26/2019   TSH 1.11 01/26/2019   HGBA1C 6.4 06/03/2019   MICROALBUR <0.7 11/19/2014   The 10-year ASCVD risk score Mikey Bussing DC Jr., et al., 2013) is: 37.9%   Values used to calculate the score:     Age: 54 years     Sex: Female     Is Non-Hispanic African American: Yes     Diabetic: No     Tobacco smoker: Yes     Systolic Blood Pressure: [id number] mmHg     Is BP treated: Yes     HDL Cholesterol: 33.9 mg/dL     Total Cholesterol: 204 mg/dL  ASSESSMENT AND PLAN:  Discussed the following assessment and plan:    ICD-10-CM   1. Hyperlipidemia, unspecified hyperlipidemia type  E78.5 Lipid panel    Hemoglobin A1c    Hepatic function panel  2. Medication management  Z79.899 Lipid panel    Hemoglobin A1c    Hepatic function panel  3. Prediabetes  R73.03 Lipid panel    Hemoglobin A1c    Hepatic function panel   Lab reveiwed  a1c 6.4 dec from 6.5 Lipids aboutt he same   Risk benefit of medication discussed.  Add statin med  crestor 10 qd furuter orders lipid lft  a1c in the fall    Counseled. Continue lifestyle intervention healthy eating and exercise .   Expectant management and discussion of plan and treatment with opportunity to ask questions and all were answered. The patient agreed with the plan and demonstrated an understanding of the instructions.   Advised to call back or seek an in-person evaluation if worsening  or having  further concerns . In interim  Return for fasting lab in fall   and  then ryearly cpx and labs med check  jan.   Shanon Ace, MD

## 2019-06-29 ENCOUNTER — Other Ambulatory Visit: Payer: Self-pay | Admitting: Neurology

## 2019-08-25 ENCOUNTER — Other Ambulatory Visit: Payer: Self-pay | Admitting: Internal Medicine

## 2019-08-25 NOTE — Telephone Encounter (Signed)
Last OV 06/08/2019  Last filled on 04/27/2019, # 30 with 1 refill  OK to continue this dose?

## 2019-08-25 NOTE — Telephone Encounter (Signed)
I refilled x 1  Please have her give [name] a weight update  Also  Remind her to have her lipid panel  And labs done as in the system since  Starting the statin medication

## 2019-09-18 ENCOUNTER — Ambulatory Visit: Payer: BC Managed Care – PPO | Admitting: Neurology

## 2019-11-28 ENCOUNTER — Other Ambulatory Visit: Payer: Self-pay | Admitting: Internal Medicine

## 2019-12-18 ENCOUNTER — Ambulatory Visit: Payer: BC Managed Care – PPO | Admitting: Neurology

## 2019-12-23 ENCOUNTER — Other Ambulatory Visit: Payer: Self-pay | Admitting: Neurology

## 2019-12-23 NOTE — Telephone Encounter (Signed)
This was in Dr. Lars Masson in box but think that it is your patient?

## 2020-01-01 ENCOUNTER — Encounter: Payer: Self-pay | Admitting: Internal Medicine

## 2020-01-06 NOTE — Progress Notes (Signed)
Follow-up Visit   Date: 01/07/20    Beth Wallace MRN: [id number] DOB: 04-25-1965   Interim History: Beth Wallace is a 54 y.o. right-handed African American female with hypertension, seasonal allergies, and GERD returning to the clinic for cervicogenic headaches. The patient was accompanied to the clinic by self.  History of present illness: Starting around January 2016, she started developing pressure over the vertex of the head and ocassionally over the forehead. Pain is improved by walking and resting. It is worse when she is working and lasts several hours. It can occur daily but she usually does not notice the discomfort when she is not working. She has tried excedrin migraine, aleve, and tylenol none which provided significant relief. She was given prednisone injection but this did not help either.   She previously had migraine when pregnant which would be associated with photophobia and phonophobia.   She is a delightful lady who unfortunately has lost her 55 year old daughter and younger sister in a triple homicide. She has a tremendous strength and integrity of character and has managed to deal with such tragedy in an effective manner.   In early 2017, she started neck physiotherapy with at least 50% improvement in her head pressure. Starting in late January 2018, she began having neck pain, described as soreness, which sometimes also involves her arms. There is no numbness/tingling or weakness.  She feels that it may be related to how much time she spends on her computer and poor posture so is having an adjustable desk placed at home.  She tried naproxen which helps.  She also saw a chiropractor and felt that this has improved her range of motion.   UPDATE 01/07/2020:  She is having a lot of work-related stress which triggers her neck pain and headaches.  She will be on break for the next two weeks and his hopeful that rest and stretching will alleviate  her pain.  She also takes muscle relaxants, as needed.  She takes tizanidine 2mg  at bedtime for moderate headache, every 1-2 weeks.  No new complaints.   Medications:  Current Outpatient Medications on File Prior to Visit  Medication Sig Dispense Refill  . ALPRAZolam (XANAX) 0.5 MG tablet TAKE 1 TABLET BY MOUTH 3 TIMES A DAY AS NEEDED FOR ANXIETY.AVOID REGULAR USE 24 tablet 0  . azelastine (ASTELIN) 0.1 % nasal spray 1 2 PUFF IN EACH NOSTRIL TWICE A DAY AS NEEDED NASALLY 30 DAY(S)    . Azelastine-Fluticasone 137-50 MCG/ACT SUSP Dymista 137 mcg-50 mcg/spray nasal spray    . cetirizine (ZYRTEC) 10 MG tablet Take 10 mg by mouth at bedtime.    . chlorhexidine (PERIDEX) 0.12 % solution 15 mLs 2 (two) times daily.    . cyclobenzaprine (FLEXERIL) 5 MG tablet Take 5 mg by mouth at bedtime as needed for muscle spasms (neck pain (severe)).    . diclofenac sodium (VOLTAREN) 1 % GEL Apply 4 g topically 4 (four) times daily. For pain 4 Tube 1  . fexofenadine (ALLEGRA) 180 MG tablet Take 180 mg by mouth daily.    . fluticasone (FLONASE) 50 MCG/ACT nasal spray Place 2 sprays into both nostrils daily.    . hydrOXYzine (ATARAX/VISTARIL) 10 MG tablet Take 10 mg by mouth 3 (three) times daily as needed for itching.    Marland Kitchen ibuprofen (ADVIL,MOTRIN) 800 MG tablet TAKE 1 TABLET BY MOUTH EVERY 8 HOURS AS NEEDED FOR CRAMPS 30 tablet 2  . lisinopril-hydrochlorothiazide (ZESTORETIC) 20-25 MG tablet TAKE 1 TABLET BY MOUTH  EVERY DAY 90 tablet 3  . metFORMIN (GLUCOPHAGE) 500 MG tablet TAKE 1 TABLET (500 MG TOTAL) BY MOUTH DAILY WITH BREAKFAST. INCREASE TO TWICE A DAY AFTER 3 WEEKS OR AS DIRECTED 180 tablet 1  . methocarbamol (ROBAXIN) 500 MG tablet Take 500 mg by mouth daily as needed for muscle spasms (Neck pain).    . montelukast (SINGULAIR) 10 MG tablet Take 10 mg by mouth at bedtime.    . naproxen (NAPROSYN) 500 MG tablet TAKE 1 TABLET BY MOUTH EVERY 12 (TWELVE) HOURS AS NEEDED FOR MILD PAIN (NECK PAIN). 60 tablet 1  .  omeprazole (PRILOSEC) 20 MG capsule TAKE 1 CAPSULE BY MOUTH EVERY DAY 90 capsule 2  . phentermine 30 MG capsule TAKE 1 CAPSULE BY MOUTH EVERY DAY IN THE MORNING 30 capsule 0  . rosuvastatin (CRESTOR) 10 MG tablet TAKE 1 TABLET BY MOUTH EVERY DAY 90 tablet 1  . tiZANidine (ZANAFLEX) 2 MG tablet TAKE 1 TABLET (2 MG TOTAL) BY MOUTH AT BEDTIME AS NEEDED FOR MUSCLE SPASMS. 90 tablet 1   No current facility-administered medications on file prior to visit.    Allergies:  Allergies  Allergen Reactions  . Bee Venom Shortness Of Breath  . Contrast Media [Iodinated Diagnostic Agents] Anaphylaxis    CT dye ivp Throat swelling  . Other     Vital Signs:  BP 125/88   Pulse 84   Ht 5\' 8"  (1.727 m)   Wt (!) 304 lb (137.9 kg)   SpO2 99%   BMI 46.22 kg/m   Neurological Exam: MENTAL STATUS including orientation to time, place, person, recent and remote memory, attention span and concentration, language, and fund of knowledge is normal.  Speech is not dysarthric.  CRANIAL NERVES:  Normal conjugate, extra-ocular eye movements in all directions of gaze.    MOTOR:  Motor strength is 5/5 in all extremities.  She has cervical muscle tension and reduced neck ROM with rotation, neck flexion, and side bending.   COORDINATION/GAIT:   Gait narrow based and stable.   Data: XR cervical spine 12/30/2013: Mild spondylosis at C5-6. No acute osseous abnormality.  IMPRESSION: 1.  Cervicalgia with left cervical radiculopathy.  Mild worsening with work-related stress.  She will start home neck stretching  - Continue muscle relaxants as needed.   - Methocarmabol 500mg  in the morning, OR tizanidine 2mg  at bedtime (moderate pain) OR flexeril 5mg  at bedtime (severe pain)  - Start home neck stretching  2.  Tension headaches, responsive to NSAIDs as needed.    Return to clinic in 1 year  Thank you for allowing me to participate in patient's care.  If I can answer any additional questions, I would be pleased to  do so.    Sincerely,    Roselynne Lortz K. 01/01/2014, DO

## 2020-01-07 ENCOUNTER — Encounter: Payer: Self-pay | Admitting: Neurology

## 2020-01-07 ENCOUNTER — Other Ambulatory Visit: Payer: Self-pay

## 2020-01-07 ENCOUNTER — Ambulatory Visit (INDEPENDENT_AMBULATORY_CARE_PROVIDER_SITE_OTHER): Payer: BC Managed Care – PPO | Admitting: Neurology

## 2020-01-07 VITALS — BP 125/88 | HR 84 | Ht 68.0 in | Wt 304.0 lb

## 2020-01-07 DIAGNOSIS — M5412 Radiculopathy, cervical region: Secondary | ICD-10-CM

## 2020-01-07 DIAGNOSIS — G44209 Tension-type headache, unspecified, not intractable: Secondary | ICD-10-CM | POA: Diagnosis not present

## 2020-01-07 DIAGNOSIS — M542 Cervicalgia: Secondary | ICD-10-CM | POA: Diagnosis not present

## 2020-01-07 NOTE — Patient Instructions (Signed)
Great to see you today!  Have a safe holidays and healthy New Year!

## 2020-01-26 ENCOUNTER — Other Ambulatory Visit: Payer: Self-pay | Admitting: Internal Medicine

## 2020-01-26 NOTE — Telephone Encounter (Signed)
Last office visit- 06/08/2019  Last refill---Phentermine-08/25/2019--30 capsules no refills  Alprazolam--02/19/2019--24 tablets no refills   Pharmacy has been updated

## 2020-01-26 NOTE — Telephone Encounter (Signed)
Due for visit   Med check  Have her make a virtual or in person  ( last  Visit was May 2021)  And then can decide  About  Medication refills

## 2020-01-27 NOTE — Telephone Encounter (Signed)
Message sent to Pt to schedule apt/ -JMA

## 2020-01-29 ENCOUNTER — Encounter: Payer: BC Managed Care – PPO | Admitting: Internal Medicine

## 2020-02-01 ENCOUNTER — Telehealth: Payer: BC Managed Care – PPO | Admitting: Internal Medicine

## 2020-02-02 NOTE — Telephone Encounter (Signed)
Her appt got canceled  Because of weather closing   Can she get back on my schedule  Virtual ok this week?  To do med evaluation

## 2020-02-03 ENCOUNTER — Encounter: Payer: BC Managed Care – PPO | Admitting: Internal Medicine

## 2020-02-03 NOTE — Telephone Encounter (Signed)
Patient informed of the message below.  Stated she has an appt on 1/20.

## 2020-02-04 ENCOUNTER — Telehealth (INDEPENDENT_AMBULATORY_CARE_PROVIDER_SITE_OTHER): Payer: BC Managed Care – PPO | Admitting: Internal Medicine

## 2020-02-04 ENCOUNTER — Encounter: Payer: Self-pay | Admitting: Internal Medicine

## 2020-02-04 VITALS — BP 126/82 | Temp 97.8°F | Wt 286.0 lb

## 2020-02-04 DIAGNOSIS — Z79899 Other long term (current) drug therapy: Secondary | ICD-10-CM

## 2020-02-04 DIAGNOSIS — R7303 Prediabetes: Secondary | ICD-10-CM | POA: Diagnosis not present

## 2020-02-04 DIAGNOSIS — I10 Essential (primary) hypertension: Secondary | ICD-10-CM

## 2020-02-04 NOTE — Progress Notes (Signed)
Virtual Visit via Video Note  I connected with@ on 02/04/20 at  9:00 AM EST by a video enabled telemedicine application and verified that I am speaking with the correct person using two identifiers. Location patient: home Location provider:work or home office Persons participating in the virtual visit: patient, provider  [place] national recommendations  regarding COVID 19 pandemic   video visit is advised over in office visit for this patient.  Patient aware  of the limitations of evaluation and management by telemedicine and  availability of in person appointments. and agreed to proceed. 20 minutes tryin gto connect and finally successful   HPI: Beth Wallace presents for video visit At his set Medicine refill request on phentermine and alprazolam. States she is doing okay is taking phentermine only about 3 days a week because it is helpful she is gone from 304 pounds down to 286 pounds.  She is working from home recent had some extra stress with work so was taking the alprazolam only takes it occasional and when travels. She did have her GYN yearly check blood pressure was 126/82 had mammogram her OB/GYN is in [place]. She is vaccinated against COVID. No new chest pain shortness of breath does not know what her blood sugar is but has been feeling okay. Has an upcoming visit in April.   ROS: See pertinent positives and negatives per HPI.  Past Medical History:  Diagnosis Date  . ADJ DISORDER WITH MIXED ANXIETY & DEPRESSED MOOD 12/14/2009   Qualifier: Diagnosis of  By: Regis Bill MD, Standley Brooking   . Allergic reaction 05/18/2011  . Allergy    SEASONAL   . Anxiety   . Diabetes mellitus without complication ([place])    pre diabetic- not started Metformin - diet and exercise to control   . GERD (gastroesophageal reflux disease)   . Heart murmur    slight years ago   . History of gallstones   . HTN (hypertension)   . Metrorrhagia 12/14/2009   Qualifier: Diagnosis of  By: Regis Bill MD,  Standley Brooking With pelvic pain  Sees Dr Philis Pique and tobotic hysterectomy scheduled   . Neuromuscular disorder ([place])    cervical issues to arms  . Obesity   . Rapid palpitations 01/23/2012   Probably related to stress had negative cardiac workup in the past rule out metabolic   . TMJ (temporomandibular joint disorder)     Past Surgical History:  Procedure Laterality Date  . ABDOMINAL HYSTERECTOMY  09/2010   partial  . CESAREAN SECTION    . CHOLECYSTECTOMY  1989  . UPPER GASTROINTESTINAL ENDOSCOPY    . WISDOM TOOTH EXTRACTION  2010    Family History  Problem Relation Age of Onset  . Heart disease Mother 53       Died age 34, MI  . Heart attack Mother   . Hypertension Mother   . Hypertension Father        Living  . Diabetes Father   . Heart disease Father   . Other Sister        Killed, age 54  . Other Daughter        Killed, age 60  . Colon polyps Maternal Aunt   . Hyperlipidemia Neg Hx   . Sudden death Neg Hx   . Colon cancer Neg Hx   . Esophageal cancer Neg Hx   . Rectal cancer Neg Hx   . Stomach cancer Neg Hx     Social History   Tobacco Use  . Smoking  status: Current Some Day Smoker    Packs/day: 0.25    Types: Cigarettes    Last attempt to quit: 01/22/2012    Years since quitting: 8.0  . Smokeless tobacco: Never Used  . Tobacco comment: 1-2 cigarretes daily  Vaping Use  . Vaping Use: Never used  Substance Use Topics  . Alcohol use: No    Alcohol/week: 0.0 standard drinks  . Drug use: No      Current Outpatient Medications:  .  azelastine (ASTELIN) 0.1 % nasal spray, 1 2 PUFF IN EACH NOSTRIL TWICE A DAY AS NEEDED NASALLY 30 DAY(S), Disp: , Rfl:  .  Azelastine-Fluticasone 137-50 MCG/ACT SUSP, Dymista 137 mcg-50 mcg/spray nasal spray, Disp: , Rfl:  .  cetirizine (ZYRTEC) 10 MG tablet, Take 10 mg by mouth at bedtime., Disp: , Rfl:  .  chlorhexidine (PERIDEX) 0.12 % solution, 15 mLs 2 (two) times daily., Disp: , Rfl:  .  cyclobenzaprine (FLEXERIL) 5 MG tablet,  Take 5 mg by mouth at bedtime as needed for muscle spasms (neck pain (severe))., Disp: , Rfl:  .  fexofenadine (ALLEGRA) 180 MG tablet, Take 180 mg by mouth as needed., Disp: , Rfl:  .  fluticasone (FLONASE) 50 MCG/ACT nasal spray, Place 2 sprays into both nostrils daily., Disp: , Rfl:  .  hydrOXYzine (ATARAX/VISTARIL) 10 MG tablet, Take 10 mg by mouth 3 (three) times daily as needed for itching., Disp: , Rfl:  .  ibuprofen (ADVIL,MOTRIN) 800 MG tablet, TAKE 1 TABLET BY MOUTH EVERY 8 HOURS AS NEEDED FOR CRAMPS, Disp: 30 tablet, Rfl: 2 .  lisinopril-hydrochlorothiazide (ZESTORETIC) 20-25 MG tablet, TAKE 1 TABLET BY MOUTH EVERY DAY, Disp: 90 tablet, Rfl: 3 .  metFORMIN (GLUCOPHAGE) 500 MG tablet, TAKE 1 TABLET (500 MG TOTAL) BY MOUTH DAILY WITH BREAKFAST. INCREASE TO TWICE A DAY AFTER 3 WEEKS OR AS DIRECTED, Disp: 180 tablet, Rfl: 1 .  methocarbamol (ROBAXIN) 500 MG tablet, Take 500 mg by mouth daily as needed for muscle spasms (Neck pain)., Disp: , Rfl:  .  montelukast (SINGULAIR) 10 MG tablet, Take 10 mg by mouth at bedtime., Disp: , Rfl:  .  naproxen (NAPROSYN) 500 MG tablet, TAKE 1 TABLET BY MOUTH EVERY 12 (TWELVE) HOURS AS NEEDED FOR MILD PAIN (NECK PAIN)., Disp: 60 tablet, Rfl: 1 .  omeprazole (PRILOSEC) 20 MG capsule, TAKE 1 CAPSULE BY MOUTH EVERY DAY, Disp: 90 capsule, Rfl: 2 .  rosuvastatin (CRESTOR) 10 MG tablet, TAKE 1 TABLET BY MOUTH EVERY DAY, Disp: 90 tablet, Rfl: 1 .  tiZANidine (ZANAFLEX) 2 MG tablet, TAKE 1 TABLET (2 MG TOTAL) BY MOUTH AT BEDTIME AS NEEDED FOR MUSCLE SPASMS., Disp: 90 tablet, Rfl: 1 .  ALPRAZolam (XANAX) 0.5 MG tablet, TAKE 1 TABLET BY MOUTH 3 TIMES A DAY AS NEEDED FOR ANXIETY, Disp: 24 tablet, Rfl: 0 .  phentermine 30 MG capsule, TAKE 1 CAPSULE BY MOUTH EVERY DAY IN THE MORNING, Disp: 30 capsule, Rfl: 0  EXAM: BP Readings from Last 3 Encounters:  02/04/20 126/82  01/07/20 125/88  01/26/19 124/80    VITALS per patient if applicable:  GENERAL: alert, oriented,  appears well and in no acute distress  HEENT: atraumatic, conjunttiva clear, no obvious abnormalities on inspection of external nose and ears  NECK: normal movements of the head and neck  LUNGS: on inspection no signs of respiratory distress, breathing rate appears normal, no obvious gross SOB, gasping or wheezing  CV: no obvious cyanosis  MS: moves all visible extremities without noticeable abnormality  PSYCH/NEURO: pleasant and cooperative, no obvious depression or anxiety, speech and thought processing grossly intact Lab Results  Component Value Date   WBC 5.4 01/26/2019   HGB 14.0 01/26/2019   HCT 42.1 01/26/2019   PLT 330.0 01/26/2019   GLUCOSE 112 (H) 01/26/2019   CHOL 204 (H) 06/03/2019   TRIG 112.0 06/03/2019   HDL 33.90 (L) 06/03/2019   LDLCALC 147 (H) 06/03/2019   ALT 23 01/26/2019   AST 20 01/26/2019   NA 141 01/26/2019   K 3.7 01/26/2019   CL 102 01/26/2019   CREATININE 0.74 01/26/2019   BUN 13 01/26/2019   CO2 28 01/26/2019   TSH 1.11 01/26/2019   HGBA1C 6.4 06/03/2019   MICROALBUR <0.7 11/19/2014    ASSESSMENT AND PLAN:  Discussed the following assessment and plan:  No diagnosis found. Overdue for lab monitoring at this time We will refill her phentermine can use as needed and continue healthy weight loss. Limited and caution with alprazolam if feels requires more frequently consider controller medication. Benefit more than risk of medications  to continue. As needed   Overall however she sounds like she is doing pretty well. Will place orders so she can get lab tests before her visit. Counseled.   Expectant management and discussion of plan and treatment with opportunity to ask questions and all were answered. The patient agreed with the plan and demonstrated an understanding of the instructions.   Advised to call back or seek an in-person evaluationr having  further concerns . In interim  Record review visit and counsel and ordering  30 minutes   Return for Blood work to be done before CPX and visit in the spring.  Orders placed.   Shanon Ace, MD

## 2020-02-07 ENCOUNTER — Other Ambulatory Visit: Payer: Self-pay | Admitting: Internal Medicine

## 2020-03-19 ENCOUNTER — Other Ambulatory Visit: Payer: Self-pay | Admitting: Internal Medicine

## 2020-03-21 NOTE — Telephone Encounter (Signed)
Last refill- 02/04/2020- 30 tabs no refills Last video visit- 02/04/2020  Next appointment- 04/18/2020

## 2020-04-18 ENCOUNTER — Encounter: Payer: BC Managed Care – PPO | Admitting: Internal Medicine

## 2020-05-23 ENCOUNTER — Encounter: Payer: BC Managed Care – PPO | Admitting: Internal Medicine

## 2020-06-06 ENCOUNTER — Other Ambulatory Visit: Payer: Self-pay | Admitting: Internal Medicine

## 2020-07-13 ENCOUNTER — Encounter: Payer: BC Managed Care – PPO | Admitting: Internal Medicine

## 2020-07-23 ENCOUNTER — Other Ambulatory Visit: Payer: Self-pay | Admitting: Internal Medicine

## 2020-07-25 ENCOUNTER — Other Ambulatory Visit: Payer: Self-pay | Admitting: Internal Medicine

## 2020-08-22 ENCOUNTER — Encounter: Payer: BC Managed Care – PPO | Admitting: Internal Medicine

## 2020-08-28 ENCOUNTER — Other Ambulatory Visit: Payer: Self-pay | Admitting: Internal Medicine

## 2020-08-29 ENCOUNTER — Other Ambulatory Visit: Payer: Self-pay

## 2020-08-29 ENCOUNTER — Ambulatory Visit (INDEPENDENT_AMBULATORY_CARE_PROVIDER_SITE_OTHER): Payer: BC Managed Care – PPO | Admitting: Internal Medicine

## 2020-08-29 ENCOUNTER — Encounter: Payer: Self-pay | Admitting: Internal Medicine

## 2020-08-29 VITALS — BP 122/74 | HR 77 | Temp 98.5°F | Ht 67.5 in | Wt 303.4 lb

## 2020-08-29 DIAGNOSIS — E785 Hyperlipidemia, unspecified: Secondary | ICD-10-CM | POA: Diagnosis not present

## 2020-08-29 DIAGNOSIS — Z0000 Encounter for general adult medical examination without abnormal findings: Secondary | ICD-10-CM | POA: Diagnosis not present

## 2020-08-29 DIAGNOSIS — I10 Essential (primary) hypertension: Secondary | ICD-10-CM | POA: Diagnosis not present

## 2020-08-29 DIAGNOSIS — Z79899 Other long term (current) drug therapy: Secondary | ICD-10-CM

## 2020-08-29 DIAGNOSIS — Z23 Encounter for immunization: Secondary | ICD-10-CM | POA: Diagnosis not present

## 2020-08-29 DIAGNOSIS — R7303 Prediabetes: Secondary | ICD-10-CM

## 2020-08-29 DIAGNOSIS — E786 Lipoprotein deficiency: Secondary | ICD-10-CM

## 2020-08-29 DIAGNOSIS — Z720 Tobacco use: Secondary | ICD-10-CM

## 2020-08-29 LAB — CBC WITH DIFFERENTIAL/PLATELET
Basophils Absolute: 0 10*3/uL (ref 0.0–0.1)
Basophils Relative: 0.6 % (ref 0.0–3.0)
Eosinophils Absolute: 0.1 10*3/uL (ref 0.0–0.7)
Eosinophils Relative: 1.5 % (ref 0.0–5.0)
HCT: 42.6 % (ref 36.0–46.0)
Hemoglobin: 14.1 g/dL (ref 12.0–15.0)
Lymphocytes Relative: 43.6 % (ref 12.0–46.0)
Lymphs Abs: 2.2 10*3/uL (ref 0.7–4.0)
MCHC: 33.1 g/dL (ref 30.0–36.0)
MCV: 87.2 fl (ref 78.0–100.0)
Monocytes Absolute: 0.3 10*3/uL (ref 0.1–1.0)
Monocytes Relative: 6.1 % (ref 3.0–12.0)
Neutro Abs: 2.4 10*3/uL (ref 1.4–7.7)
Neutrophils Relative %: 48.2 % (ref 43.0–77.0)
Platelets: 303 10*3/uL (ref 150.0–400.0)
RBC: 4.89 Mil/uL (ref 3.87–5.11)
RDW: 14.8 % (ref 11.5–15.5)
WBC: 4.9 10*3/uL (ref 4.0–10.5)

## 2020-08-29 LAB — BASIC METABOLIC PANEL
BUN: 13 mg/dL (ref 6–23)
CO2: 30 mEq/L (ref 19–32)
Calcium: 9.8 mg/dL (ref 8.4–10.5)
Chloride: 98 mEq/L (ref 96–112)
Creatinine, Ser: 0.85 mg/dL (ref 0.40–1.20)
GFR: 78.39 mL/min (ref >60.00)
Glucose, Bld: 101 mg/dL — ABNORMAL HIGH (ref 70–99)
Potassium: 4.3 mEq/L (ref 3.5–5.1)
Sodium: 138 mEq/L (ref 135–145)

## 2020-08-29 LAB — LIPID PANEL
Cholesterol: 132 mg/dL (ref 0–200)
HDL: 37.6 mg/dL — ABNORMAL LOW (ref >39.00)
LDL Cholesterol: 73 mg/dL (ref 0–99)
NonHDL: 94.37
Total CHOL/HDL Ratio: 4
Triglycerides: 105 mg/dL (ref 0.0–149.0)
VLDL: 21 mg/dL (ref 0.0–40.0)

## 2020-08-29 LAB — HEPATIC FUNCTION PANEL
ALT: 22 U/L (ref 0–35)
AST: 19 U/L (ref 0–37)
Albumin: 4.5 g/dL (ref 3.5–5.2)
Alkaline Phosphatase: 74 U/L (ref 39–117)
Bilirubin, Direct: 0.1 mg/dL (ref 0.0–0.3)
Total Bilirubin: 0.7 mg/dL (ref 0.2–1.2)
Total Protein: 7.5 g/dL (ref 6.0–8.3)

## 2020-08-29 LAB — HEMOGLOBIN A1C: Hgb A1c MFr Bld: 6.8 % — ABNORMAL HIGH (ref 4.6–6.5)

## 2020-08-29 LAB — TSH: TSH: 1.14 u[IU]/mL (ref 0.35–5.50)

## 2020-08-29 MED ORDER — OMEPRAZOLE 20 MG PO CPDR: 20.0000 mg | DELAYED_RELEASE_CAPSULE | Freq: Every day | ORAL | 1 refills | Status: DC

## 2020-08-29 MED ORDER — ALPRAZOLAM 0.5 MG PO TABS: ORAL_TABLET | ORAL | 0 refills | Status: DC

## 2020-08-29 MED ORDER — LISINOPRIL-HYDROCHLOROTHIAZIDE 20-25 MG PO TABS
1.0000 | ORAL_TABLET | Freq: Every day | ORAL | 3 refills | Status: DC
Start: 2020-08-29 — End: 2021-08-07

## 2020-08-29 MED ORDER — PHENTERMINE HCL 30 MG PO CAPS: ORAL_CAPSULE | ORAL | 2 refills | Status: DC

## 2020-08-29 NOTE — Addendum Note (Signed)
Addended by: Nilda Riggs on: 08/29/2020 03:46 PM   Modules accepted: Orders

## 2020-08-29 NOTE — Progress Notes (Signed)
Chief Complaint  Patient presents with   Annual Exam   Follow-up   Medication Management     HPI: Patient  Beth Wallace  55 y.o. comes in today for Preventive Health Care visit  and med issues   Obesity  has gained weight off  med  hard to get motivated  very inert with  screen attached  job. Weight watchers  helped in past.  Know what to do  plans on motivation.,   Got promotion and will need to travel some  asks for refill xanax.( Still gets anxiety after death of her daughter )  BG: only taking one metformin per day . BP has been ok needs refill  Needs refill  omeprazole  HLD taking medication  Health Maintenance  Topic Date Due   Pneumococcal Vaccine 65-65 Years old (1 - PCV) Never done   HIV Screening  Never done   Hepatitis C Screening  Never done   Zoster Vaccines- Shingrix (1 of 2) Never done   TETANUS/TDAP  05/16/2019   PAP SMEAR-Modifier  12/16/2019   COVID-19 Vaccine (5 - Booster for Moderna series) 04/10/2020   INFLUENZA VACCINE  08/15/2020   MAMMOGRAM  01/21/2021   COLONOSCOPY (Pts 45-57yr Insurance coverage will need to be confirmed)  02/25/2028   HPV VACCINES  Aged Out   Health Maintenance Review LIFESTYLE:  Exercise:   no walking recently .   Motivation.  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   ROS:  REST of 12 system review negative except as per HPI   Past Medical History:  Diagnosis Date   ADJ DISORDER WITH MIXED ANXIETY & DEPRESSED MOOD 12/14/2009   Qualifier: Diagnosis of  By: PRegis BillMD, WStandley Brooking   Allergic reaction 05/18/2011   Allergy    SEASONAL    Anxiety    Diabetes mellitus without complication ([place]    pre diabetic- not started Metformin - diet and exercise to control    GERD (gastroesophageal reflux disease)    Heart murmur    slight years ago    History of  gallstones    HTN (hypertension)    Metrorrhagia 12/14/2009   Qualifier: Diagnosis of  By: PRegis BillMD, WStandley BrookingWith pelvic pain  Sees Dr HPhilis Piqueand tobotic hysterectomy scheduled    Neuromuscular disorder ([place]    cervical issues to arms   Obesity    Rapid palpitations 01/23/2012   Probably related to stress had negative cardiac workup in the past rule out metabolic    TMJ (temporomandibular joint disorder)     Past Surgical History:  Procedure Laterality Date   ABDOMINAL HYSTERECTOMY  09/2010   partial   CESAREAN SECTION     CHOLECYSTECTOMY  1989   UPPER GASTROINTESTINAL ENDOSCOPY     WISDOM TOOTH EXTRACTION  2010    Family History  Problem Relation Age of Onset   Heart disease Mother 580      Died age 278 MI   Heart attack Mother    Hypertension Mother    Hypertension Father        Living   Diabetes Father    Heart disease Father    Other Sister        Killed, age 53  Other Daughter        Killed, age 44   Colon polyps Maternal Aunt    Hyperlipidemia Neg Hx    Sudden death Neg Hx    Colon cancer Neg Hx    Esophageal cancer Neg Hx    Rectal cancer Neg Hx    Stomach cancer Neg Hx     Social History   Socioeconomic History   Marital status: Married    Spouse name: Not on file   Number of children: 1   Years of education: 16   Highest education level: Not on file  Occupational History   Occupation: Best boy: [employer]  Tobacco Use   Smoking status: Some Days    Packs/day: 0.25    Types: Cigarettes    Last attempt to quit: 01/22/2012    Years since quitting: 8.6   Smokeless tobacco: Never   Tobacco comments:    1-2 cigarretes daily  Vaping Use   Vaping Use: Never used  Substance and Sexual Activity   Alcohol use: No    Alcohol/week: 0.0 standard drinks   Drug use: No   Sexual activity: Not on file  Other Topics Concern   Not on file  Social History Narrative   Orig from [place] and changes for job,     7-8 hours sleep per night     Born in  [place], [place],     [employer],     Gaffer,    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.,       Back in school gen ed   At [employer],      [facility] of 2,     BF and dog,     G3P1,     Bereaved parent daughter murdered 2008,   Trial finished  Baldo Ash guilty this week march    No etoh,      No caffeine,     Minimal tobacco,       ETS     Got married feb 14    Right Handed   Social Determinants of Health   Financial Resource Strain: Not on file  Food Insecurity: Not on file  Transportation Needs: Not on file  Physical Activity: Not on file  Stress: Not on file  Social Connections: Not on file    Outpatient Medications Prior to Visit  Medication Sig Dispense Refill   azelastine (ASTELIN) 0.1 % nasal spray 1 2 PUFF IN EACH NOSTRIL TWICE A DAY AS NEEDED NASALLY 30 DAY(S)     Azelastine-Fluticasone 137-50 MCG/ACT SUSP Dymista 137 mcg-50 mcg/spray nasal spray     cetirizine (ZYRTEC) 10 MG tablet Take 10 mg by mouth at bedtime.     chlorhexidine (PERIDEX) 0.12 % solution 15 mLs 2 (two) times daily.     cyclobenzaprine (FLEXERIL) 5 MG tablet Take 5 mg by mouth at bedtime as needed for muscle spasms (neck pain (severe)).     fexofenadine (ALLEGRA) 180 MG tablet Take 180 mg by mouth as needed.     fluticasone (FLONASE) 50 MCG/ACT nasal spray Place 2 sprays into both nostrils daily.     hydrOXYzine (ATARAX/VISTARIL) 10 MG tablet Take 10 mg by mouth 3 (three) times daily as needed for itching.     ibuprofen (ADVIL,MOTRIN) 800 MG tablet TAKE 1 TABLET BY MOUTH EVERY 8 HOURS AS NEEDED FOR CRAMPS 30 tablet 2   metFORMIN (GLUCOPHAGE) 500 MG tablet TAKE 1 TABLET (500  MG TOTAL) BY MOUTH DAILY WITH BREAKFAST. INCREASE TO TWICE A DAY AFTER 3 WEEKS OR AS DIRECTED 180 tablet 1   methocarbamol (ROBAXIN) 500 MG tablet Take 500 mg by mouth daily as needed for muscle spasms (Neck pain).     montelukast (SINGULAIR) 10 MG tablet Take 10 mg by mouth at bedtime.     naproxen  (NAPROSYN) 500 MG tablet TAKE 1 TABLET BY MOUTH EVERY 12 (TWELVE) HOURS AS NEEDED FOR MILD PAIN (NECK PAIN). 60 tablet 1   rosuvastatin (CRESTOR) 10 MG tablet TAKE 1 TABLET BY MOUTH EVERY DAY 90 tablet 0   tiZANidine (ZANAFLEX) 2 MG tablet TAKE 1 TABLET (2 MG TOTAL) BY MOUTH AT BEDTIME AS NEEDED FOR MUSCLE SPASMS. 90 tablet 1   ALPRAZolam (XANAX) 0.5 MG tablet TAKE 1 TABLET BY MOUTH 3 TIMES A DAY AS NEEDED FOR ANXIETY 24 tablet 0   lisinopril-hydrochlorothiazide (ZESTORETIC) 20-25 MG tablet TAKE 1 TABLET BY MOUTH EVERY DAY 90 tablet 0   omeprazole (PRILOSEC) 20 MG capsule TAKE 1 CAPSULE BY MOUTH EVERY DAY 90 capsule 1   phentermine 30 MG capsule TAKE 1 CAPSULE BY MOUTH EVERY DAY IN THE MORNING 30 capsule 0   No facility-administered medications prior to visit.     EXAM:  BP 122/74 (BP Location: Left Arm, Patient Position: Sitting, Cuff Size: Large)   Pulse 77   Temp 98.5 F (36.9 C) (Oral)   Ht 5' 7.5" (1.715 m)   Wt (!) 303 lb 6.4 oz (137.6 kg)   SpO2 95%   BMI 46.82 kg/m   Body mass index is 46.82 kg/m. Wt Readings from Last 3 Encounters:  08/29/20 (!) 303 lb 6.4 oz (137.6 kg)  02/04/20 286 lb (129.7 kg)  01/07/20 (!) 304 lb (137.9 kg)    Physical Exam: Vital signs reviewed [id number] is a well-developed well-nourished alert cooperative    who appearsr stated age in no acute distress.  HEENT: normocephalic atraumatic , Eyes: PERRL EOM's full, conjunctiva clear, Nares: paten,t no deformity discharge or tenderness., Ears: no deformity EAC's clear TMs with normal landmarks. Mouth:  masked  NECK: supple without masses, thyromegaly or bruits. CHEST/PULM:  Clear to auscultation and percussion breath sounds equal no wheeze , rales or rhonchi. No chest wall deformities or tenderness. Breast: deferred  per gyne CV: PMI is nondisplaced, S1 S2 no gallops, murmurs, rubs. Peripheral pulses are full without delay.No JVD .  ABDOMEN: Bowel sounds normal nontender  No guard or rebound, no  hepato splenomegal no CVA tenderness.   Extremtities:  No clubbing cyanosis or edema, no acute joint swelling or redness no focal atrophy NEURO:  Oriented x3, cranial nerves 3-12 appear to be intact, no obvious focal weakness,gait within normal limits no abnormal reflexes or asymmetrical SKIN: No acute rashes normal turgor, color, no bruising or petechiae. PSYCH: Oriented, good eye contact, no obvious depression anxiety, cognition and judgment appear normal. LN: no cervical axillary inguinal adenopathy Feet   nl pulse sensation no lesion and microfilament  nl sense Lab Results  Component Value Date   WBC 5.4 01/26/2019   HGB 14.0 01/26/2019   HCT 42.1 01/26/2019   PLT 330.0 01/26/2019   GLUCOSE 112 (H) 01/26/2019   CHOL 204 (H) 06/03/2019   TRIG 112.0 06/03/2019   HDL 33.90 (L) 06/03/2019   LDLCALC 147 (H) 06/03/2019   ALT 23 01/26/2019   AST 20 01/26/2019   NA 141 01/26/2019   K 3.7 01/26/2019   CL 102 01/26/2019   CREATININE 0.74  01/26/2019   BUN 13 01/26/2019   CO2 28 01/26/2019   TSH 1.11 01/26/2019   HGBA1C 6.4 06/03/2019   MICROALBUR <0.7 11/19/2014    BP Readings from Last 3 Encounters:  08/29/20 122/74  02/04/20 126/82  01/07/20 125/88    Lab plan reviewed with patient  fasting today   ASSESSMENT AND PLAN:  Discussed the following assessment and plan:    ICD-10-CM   1. Visit for preventive health examination  123456 Basic metabolic panel    CBC with Differential/Platelet    Hemoglobin A1c    Hepatic function panel    Lipid panel    TSH    Microalbumin / creatinine urine ratio    Microalbumin / creatinine urine ratio    TSH    Lipid panel    Hepatic function panel    Hemoglobin A1c    CBC with Differential/Platelet    Basic metabolic panel    2. Morbid obesity, unspecified obesity type ([place])  123456 Basic metabolic panel    CBC with Differential/Platelet    Hemoglobin A1c    Hepatic function panel    Lipid panel    TSH    Microalbumin /  creatinine urine ratio    Microalbumin / creatinine urine ratio    TSH    Lipid panel    Hepatic function panel    Hemoglobin A1c    CBC with Differential/Platelet    Basic metabolic panel    3. Medication management  123456 Basic metabolic panel    CBC with Differential/Platelet    Hemoglobin A1c    Hepatic function panel    Lipid panel    TSH    Microalbumin / creatinine urine ratio    Microalbumin / creatinine urine ratio    TSH    Lipid panel    Hepatic function panel    Hemoglobin A1c    CBC with Differential/Platelet    Basic metabolic panel    4. Essential hypertension  [id number] Basic metabolic panel    CBC with Differential/Platelet    Hemoglobin A1c    Hepatic function panel    Lipid panel    TSH    Microalbumin / creatinine urine ratio    Microalbumin / creatinine urine ratio    TSH    Lipid panel    Hepatic function panel    Hemoglobin A1c    CBC with Differential/Platelet    Basic metabolic panel    5. Hyperlipidemia, unspecified hyperlipidemia type  [id number] Basic metabolic panel    CBC with Differential/Platelet    Hemoglobin A1c    Hepatic function panel    Lipid panel    TSH    Microalbumin / creatinine urine ratio    Microalbumin / creatinine urine ratio    TSH    Lipid panel    Hepatic function panel    Hemoglobin A1c    CBC with Differential/Platelet    Basic metabolic panel    6. Tobacco use  [id number] Basic metabolic panel    CBC with Differential/Platelet    Hemoglobin A1c    Hepatic function panel    Lipid panel    TSH    Microalbumin / creatinine urine ratio    Microalbumin / creatinine urine ratio    TSH    Lipid panel    Hepatic function panel    Hemoglobin A1c    CBC with Differential/Platelet    Basic metabolic panel    7. 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 Prediabetes  [id number] Basic metabolic panel    CBC with Differential/Platelet    Hemoglobin A1c    Hepatic function panel    Lipid panel    TSH    Microalbumin / creatinine urine ratio    Microalbumin / creatinine urine ratio    TSH    Lipid panel    Hepatic function panel    Hemoglobin A1c    CBC with Differential/Platelet    Basic metabolic panel    Refill meds  only taking 500 metformin  Had lost weight in past on phentermine and   attention . ( Gained back 17# since last visit) Will refill alprazolam x 1 for travel  Reviewed  intervention and fu  meds   Return in about 3 months (around 11/29/2020) for weight and medication.  Patient Care Team: Coumba Kellison, Standley Brooking, MD as PCP - General Ladene Artist, MD as Attending Physician (Gastroenterology) Alda Berthold, DO as Consulting Physician (Neurology) Patient Instructions  Good to see you today . Get back to weight watchers and will refill the phentermine. Will refill the alprazolam x 1  for travel.   Plan ROV in  3 months or as indicated .   Health Maintenance, Female Adopting a healthy lifestyle and getting preventive care are important in promoting health and wellness. Ask your health care provider about: The right schedule for you to have regular tests and exams. Things you can do on your own to prevent diseases and keep yourself healthy. What should I know about diet, weight, and exercise? Eat a healthy diet  Eat a diet that includes plenty of vegetables, fruits, low-fat dairy products, and lean protein. Do not eat a lot of foods that are high in solid fats, added sugars, or sodium.  Maintain a healthy weight Body mass index (BMI) is used to identify weight problems. It estimates body fat based on height and weight. Your health care provider can help  determineyour BMI and help you achieve or maintain a healthy weight. Get regular exercise Get regular exercise. This is one of the most important things you can do for your health. Most adults should: Exercise for at least 150 minutes each week. The exercise should increase your heart rate and make you sweat (moderate-intensity exercise). Do strengthening exercises at least twice a week. This is in addition to the moderate-intensity exercise. Spend less time sitting. Even light physical activity can be beneficial. Watch cholesterol and blood lipids Have your blood tested for lipids and cholesterol at 53 years of age, then havethis test every 5 years. Have your cholesterol levels checked more often if: Your lipid or cholesterol levels are high. You are older than 53 years of age. You are at high risk for heart disease. What should I know about cancer screening? Depending on your health history and family history, you may need to have cancer screening at various ages. This may include screening for: Breast cancer. Cervical cancer. Colorectal cancer. Skin cancer. Lung cancer. What should I know about heart disease,  diabetes, and high blood pressure? Blood pressure and heart disease High blood pressure causes heart disease and increases the risk of stroke. This is more likely to develop in people who have high blood pressure readings, are of African descent, or are overweight. Have your blood pressure checked: Every 3-5 years if you are 52-56 years of age. Every year if you are 67 years old or older. Diabetes Have regular diabetes screenings. This checks your fasting blood sugar level. Have the screening done: Once every three years after age 57 if you are at a normal weight and have a low risk for diabetes. More often and at a younger age if you are overweight or have a high risk for diabetes. What should I know about preventing infection? Hepatitis B If you have a higher risk for  hepatitis B, you should be screened for this virus. Talk with your health care provider to find out if you are at risk forhepatitis B infection. Hepatitis C Testing is recommended for: Everyone born from [birth year] through 1963. Anyone with known risk factors for hepatitis C. Sexually transmitted infections (STIs) Get screened for STIs, including gonorrhea and chlamydia, if: You are sexually active and are younger than 53 years of age. You are older than 53 years of age and your health care provider tells you that you are at risk for this type of infection. Your sexual activity has changed since you were last screened, and you are at increased risk for chlamydia or gonorrhea. Ask your health care provider if you are at risk. Ask your health care provider about whether you are at high risk for HIV. Your health care provider may recommend a prescription medicine to help prevent HIV infection. If you choose to take medicine to prevent HIV, you should first get tested for HIV. You should then be tested every 3 months for as long as you are taking the medicine. Pregnancy If you are about to stop having your period (premenopausal) and you may become pregnant, seek counseling before you get pregnant. Take 400 to 800 micrograms (mcg) of folic acid every day if you become pregnant. Ask for birth control (contraception) if you want to prevent pregnancy. Osteoporosis and menopause Osteoporosis is a disease in which the bones lose minerals and strength with aging. This can result in bone fractures. If you are 53 years old or older, or if you are at risk for osteoporosis and fractures, ask your health care provider if you should: Be screened for bone loss. Take a calcium or vitamin D supplement to lower your risk of fractures. Be given hormone replacement therapy (HRT) to treat symptoms of menopause. Follow these instructions at home: Lifestyle Do not use any products that contain nicotine or tobacco, such as  cigarettes, e-cigarettes, and chewing tobacco. If you need help quitting, ask your health care provider. Do not use street drugs. Do not share needles. Ask your health care provider for help if you need support or information about quitting drugs. Alcohol use Do not drink alcohol if: Your health care provider tells you not to drink. You are pregnant, may be pregnant, or are planning to become pregnant. If you drink alcohol: Limit how much you use to 0-1 drink a day. Limit intake if you are breastfeeding. Be aware of how much alcohol is in your drink. In the [place] one drink equals one 12 oz bottle of beer (355 mL), one 5 oz glass of wine (148 mL), or one 1 oz glass of hard liquor (  44 mL). General instructions Schedule regular health, dental, and eye exams. Stay current with your vaccines. Tell your health care provider if: You often feel depressed. You have ever been abused or do not feel safe at home. Summary Adopting a healthy lifestyle and getting preventive care are important in promoting health and wellness. Follow your health care provider's instructions about healthy diet, exercising, and getting tested or screened for diseases. Follow your health care provider's instructions on monitoring your cholesterol and blood pressure. This information is not intended to replace advice given to you by your health care provider. Make sure you discuss any questions you have with your healthcare provider. Document Revised: 12/25/2017 Document Reviewed: 12/25/2017 Elsevier Patient Education  2022 [place]. Ediel Unangst M.D.

## 2020-08-29 NOTE — Patient Instructions (Signed)
Good to see you today . Get back to weight watchers and will refill the phentermine. Will refill the alprazolam x 1  for travel.   Plan ROV in  3 months or as indicated .   Health Maintenance, Female Adopting a healthy lifestyle and getting preventive care are important in promoting health and wellness. Ask your health care provider about: The right schedule for you to have regular tests and exams. Things you can do on your own to prevent diseases and keep yourself healthy. What should I know about diet, weight, and exercise? Eat a healthy diet  Eat a diet that includes plenty of vegetables, fruits, low-fat dairy products, and lean protein. Do not eat a lot of foods that are high in solid fats, added sugars, or sodium.  Maintain a healthy weight Body mass index (BMI) is used to identify weight problems. It estimates body fat based on height and weight. Your health care provider can help determineyour BMI and help you achieve or maintain a healthy weight. Get regular exercise Get regular exercise. This is one of the most important things you can do for your health. Most adults should: Exercise for at least 150 minutes each week. The exercise should increase your heart rate and make you sweat (moderate-intensity exercise). Do strengthening exercises at least twice a week. This is in addition to the moderate-intensity exercise. Spend less time sitting. Even light physical activity can be beneficial. Watch cholesterol and blood lipids Have your blood tested for lipids and cholesterol at 53 years of age, then havethis test every 5 years. Have your cholesterol levels checked more often if: Your lipid or cholesterol levels are high. You are older than 53 years of age. You are at high risk for heart disease. What should I know about cancer screening? Depending on your health history and family history, you may need to have cancer screening at various ages. This may include screening  for: Breast cancer. Cervical cancer. Colorectal cancer. Skin cancer. Lung cancer. What should I know about heart disease, diabetes, and high blood pressure? Blood pressure and heart disease High blood pressure causes heart disease and increases the risk of stroke. This is more likely to develop in people who have high blood pressure readings, are of African descent, or are overweight. Have your blood pressure checked: Every 3-5 years if you are 31-66 years of age. Every year if you are 26 years old or older. Diabetes Have regular diabetes screenings. This checks your fasting blood sugar level. Have the screening done: Once every three years after age 93 if you are at a normal weight and have a low risk for diabetes. More often and at a younger age if you are overweight or have a high risk for diabetes. What should I know about preventing infection? Hepatitis B If you have a higher risk for hepatitis B, you should be screened for this virus. Talk with your health care provider to find out if you are at risk forhepatitis B infection. Hepatitis C Testing is recommended for: Everyone born from [birth year] through 1963. Anyone with known risk factors for hepatitis C. Sexually transmitted infections (STIs) Get screened for STIs, including gonorrhea and chlamydia, if: You are sexually active and are younger than 53 years of age. You are older than 53 years of age and your health care provider tells you that you are at risk for this type of infection. Your sexual activity has changed since you were last screened, and you are at increased risk  for chlamydia or gonorrhea. Ask your health care provider if you are at risk. Ask your health care provider about whether you are at high risk for HIV. Your health care provider may recommend a prescription medicine to help prevent HIV infection. If you choose to take medicine to prevent HIV, you should first get tested for HIV. You should then be tested every 3  months for as long as you are taking the medicine. Pregnancy If you are about to stop having your period (premenopausal) and you may become pregnant, seek counseling before you get pregnant. Take 400 to 800 micrograms (mcg) of folic acid every day if you become pregnant. Ask for birth control (contraception) if you want to prevent pregnancy. Osteoporosis and menopause Osteoporosis is a disease in which the bones lose minerals and strength with aging. This can result in bone fractures. If you are 5 years old or older, or if you are at risk for osteoporosis and fractures, ask your health care provider if you should: Be screened for bone loss. Take a calcium or vitamin D supplement to lower your risk of fractures. Be given hormone replacement therapy (HRT) to treat symptoms of menopause. Follow these instructions at home: Lifestyle Do not use any products that contain nicotine or tobacco, such as cigarettes, e-cigarettes, and chewing tobacco. If you need help quitting, ask your health care provider. Do not use street drugs. Do not share needles. Ask your health care provider for help if you need support or information about quitting drugs. Alcohol use Do not drink alcohol if: Your health care provider tells you not to drink. You are pregnant, may be pregnant, or are planning to become pregnant. If you drink alcohol: Limit how much you use to 0-1 drink a day. Limit intake if you are breastfeeding. Be aware of how much alcohol is in your drink. In the [place] one drink equals one 12 oz bottle of beer (355 mL), one 5 oz glass of wine (148 mL), or one 1 oz glass of hard liquor (44 mL). General instructions Schedule regular health, dental, and eye exams. Stay current with your vaccines. Tell your health care provider if: You often feel depressed. You have ever been abused or do not feel safe at home. Summary Adopting a healthy lifestyle and getting preventive care are important in promoting  health and wellness. Follow your health care provider's instructions about healthy diet, exercising, and getting tested or screened for diseases. Follow your health care provider's instructions on monitoring your cholesterol and blood pressure. This information is not intended to replace advice given to you by your health care provider. Make sure you discuss any questions you have with your healthcare provider. Document Revised: 12/25/2017 Document Reviewed: 12/25/2017 Elsevier Patient Education  2022 [employer].

## 2020-08-30 LAB — MICROALBUMIN / CREATININE URINE RATIO
Creatinine,U: 109.9 mg/dL
Microalb Creat Ratio: 0.6 mg/g (ref 0.0–30.0)
Microalb, Ur: <0.7 mg/dL (ref 0.0–1.9)

## 2020-08-30 NOTE — Progress Notes (Signed)
Cholesterol improved, hemoglobin A1c is up to 6.8, blood count and kidney function are normal Mentioned lifestyle diet weight loss healthy as we discussed and see you in 3 months.

## 2020-09-20 ENCOUNTER — Other Ambulatory Visit: Payer: Self-pay | Admitting: Neurology

## 2020-11-30 ENCOUNTER — Ambulatory Visit: Payer: BC Managed Care – PPO | Admitting: Internal Medicine

## 2021-01-06 ENCOUNTER — Ambulatory Visit: Payer: BC Managed Care – PPO | Admitting: Neurology

## 2021-01-10 ENCOUNTER — Ambulatory Visit (INDEPENDENT_AMBULATORY_CARE_PROVIDER_SITE_OTHER): Payer: BC Managed Care – PPO | Admitting: Internal Medicine

## 2021-01-10 ENCOUNTER — Encounter: Payer: Self-pay | Admitting: Internal Medicine

## 2021-01-10 ENCOUNTER — Other Ambulatory Visit: Payer: Self-pay | Admitting: Internal Medicine

## 2021-01-10 VITALS — BP 136/82 | HR 85 | Temp 99.0°F | Ht 67.5 in | Wt 299.4 lb

## 2021-01-10 DIAGNOSIS — Z23 Encounter for immunization: Secondary | ICD-10-CM | POA: Diagnosis not present

## 2021-01-10 DIAGNOSIS — Z79899 Other long term (current) drug therapy: Secondary | ICD-10-CM | POA: Diagnosis not present

## 2021-01-10 DIAGNOSIS — E1165 Type 2 diabetes mellitus with hyperglycemia: Secondary | ICD-10-CM | POA: Diagnosis not present

## 2021-01-10 DIAGNOSIS — I10 Essential (primary) hypertension: Secondary | ICD-10-CM

## 2021-01-10 DIAGNOSIS — E785 Hyperlipidemia, unspecified: Secondary | ICD-10-CM

## 2021-01-10 LAB — POCT GLYCOSYLATED HEMOGLOBIN (HGB A1C): Hemoglobin A1C: 7 % — AB (ref 4.0–5.6)

## 2021-01-10 MED ORDER — OZEMPIC (0.25 OR 0.5 MG/DOSE) 2 MG/1.5ML ~~LOC~~ SOPN: 0.2500 mg | PEN_INJECTOR | SUBCUTANEOUS | 5 refills | Status: DC

## 2021-01-10 NOTE — Patient Instructions (Signed)
°  Good to see you today /  Continue attention lifestyle intervention healthy eating and exercise .  And weigh tloss .  Ozempic weekly shot  begin starter dose and  increase after a month .   Plan rov in 3 mos check weight  etc at that time .

## 2021-01-10 NOTE — Progress Notes (Signed)
Chief Complaint  Patient presents with   Follow-up    HPI: Beth Wallace 55 y.o. come in for Chronic disease management   Asking about help with weight management either Wegovy or something similar.  She is taken to increasing her exercise is lost a few pounds here for follow-up of her blood sugar taking some metformin has some questions. Needs a flu shot today. Blood pressures been okay still works at home 40 just 50 hours. ROS: See pertinent positives and negatives per HPI.  Past Medical History:  Diagnosis Date   ADJ DISORDER WITH MIXED ANXIETY & DEPRESSED MOOD 12/14/2009   Qualifier: Diagnosis of  By: Regis Bill MD, Standley Brooking    Allergic reaction 05/18/2011   Allergy    SEASONAL    Anxiety    Diabetes mellitus without complication ([place])    pre diabetic- not started Metformin - diet and exercise to control    GERD (gastroesophageal reflux disease)    Heart murmur    slight years ago    History of gallstones    HTN (hypertension)    Metrorrhagia 12/14/2009   Qualifier: Diagnosis of  By: Regis Bill MD, Standley Brooking With pelvic pain  Sees Dr Philis Pique and tobotic hysterectomy scheduled    Neuromuscular disorder ([place])    cervical issues to arms   Obesity    Rapid palpitations 01/23/2012   Probably related to stress had negative cardiac workup in the past rule out metabolic    TMJ (temporomandibular joint disorder)     Family History  Problem Relation Age of Onset   Heart disease Mother 98       Died age 59, MI   Heart attack Mother    Hypertension Mother    Hypertension Father        Living   Diabetes Father    Heart disease Father    Other Sister        Killed, age 75   Other Daughter        Killed, age 4   Colon polyps Maternal Aunt    Hyperlipidemia Neg Hx    Sudden death Neg Hx    Colon cancer Neg Hx    Esophageal cancer Neg Hx    Rectal cancer Neg Hx    Stomach cancer Neg Hx     Social History   Socioeconomic History   Marital status: Married    Spouse  name: Not on file   Number of children: 1   Years of education: 16   Highest education level: Not on file  Occupational History   Occupation: Best boy: [employer]  Tobacco Use   Smoking status: Some Days    Packs/day: 0.25    Types: Cigarettes    Last attempt to quit: 01/22/2012    Years since quitting: 8.9   Smokeless tobacco: Never   Tobacco comments:    1-2 cigarretes daily  Vaping Use   Vaping Use: Never used  Substance and Sexual Activity   Alcohol use: No    Alcohol/week: 0.0 standard drinks   Drug use: No   Sexual activity: Not on file  Other Topics Concern   Not on file  Social History Narrative   Orig from [place] and changes for job,     7-8 hours sleep per night     Born in [place], [place],     [employer],     Gaffer,    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  On line now.,       Back in school gen ed   At [employer],      [facility] of 2,     BF and dog,     G3P1,     Bereaved parent daughter murdered 2008,   Trial finished  Baldo Ash guilty this week march    No etoh,      No caffeine,     Minimal tobacco,       ETS     Got married feb 14    Right Handed   Social Determinants of Health   Financial Resource Strain: Not on file  Food Insecurity: Not on file  Transportation Needs: Not on file  Physical Activity: Not on file  Stress: Not on file  Social Connections: Not on file    Outpatient Medications Prior to Visit  Medication Sig Dispense Refill   ALPRAZolam (XANAX) 0.5 MG tablet TAKE 1 TABLET BY MOUTH 3 TIMES A DAY AS NEEDED FOR ANXIETY 24 tablet 0   azelastine (ASTELIN) 0.1 % nasal spray 1 2 PUFF IN EACH NOSTRIL TWICE A DAY AS NEEDED NASALLY 30 DAY(S)     Azelastine-Fluticasone 137-50 MCG/ACT SUSP Dymista 137 mcg-50 mcg/spray nasal spray     cetirizine (ZYRTEC) 10 MG tablet Take 10 mg by mouth at bedtime.     chlorhexidine (PERIDEX) 0.12 % solution 15 mLs 2 (two) times daily.     cyclobenzaprine (FLEXERIL) 5 MG tablet Take 5 mg  by mouth at bedtime as needed for muscle spasms (neck pain (severe)).     fexofenadine (ALLEGRA) 180 MG tablet Take 180 mg by mouth as needed.     fluticasone (FLONASE) 50 MCG/ACT nasal spray Place 2 sprays into both nostrils daily.     hydrOXYzine (ATARAX/VISTARIL) 10 MG tablet Take 10 mg by mouth 3 (three) times daily as needed for itching.     ibuprofen (ADVIL,MOTRIN) 800 MG tablet TAKE 1 TABLET BY MOUTH EVERY 8 HOURS AS NEEDED FOR CRAMPS 30 tablet 2   lisinopril-hydrochlorothiazide (ZESTORETIC) 20-25 MG tablet Take 1 tablet by mouth daily. 90 tablet 3   metFORMIN (GLUCOPHAGE) 500 MG tablet TAKE 1 TABLET (500 MG TOTAL) BY MOUTH DAILY WITH BREAKFAST. INCREASE TO TWICE A DAY AFTER 3 WEEKS OR AS DIRECTED 180 tablet 1   methocarbamol (ROBAXIN) 500 MG tablet Take 500 mg by mouth daily as needed for muscle spasms (Neck pain).     montelukast (SINGULAIR) 10 MG tablet Take 10 mg by mouth at bedtime.     naproxen (NAPROSYN) 500 MG tablet TAKE 1 TABLET BY MOUTH EVERY 12 (TWELVE) HOURS AS NEEDED FOR MILD PAIN (NECK PAIN). 60 tablet 1   omeprazole (PRILOSEC) 20 MG capsule Take 1 capsule (20 mg total) by mouth daily. TAKE 1 CAPSULE BY MOUTH EVERY DAY 90 capsule 1   phentermine 30 MG capsule TAKE 1 CAPSULE BY MOUTH EVERY DAY IN THE MORNING 30 capsule 2   rosuvastatin (CRESTOR) 10 MG tablet TAKE 1 TABLET BY MOUTH EVERY DAY 90 tablet 3   tiZANidine (ZANAFLEX) 2 MG tablet TAKE 1 TABLET BY MOUTH AT BEDTIME AS NEEDED FOR MUSCLE SPASMS. 90 tablet 1   No facility-administered medications prior to visit.     EXAM:  BP 136/82 (BP Location: Left Arm, Patient Position: Sitting, Cuff Size: Normal)    Pulse 85    Temp 99 F (37.2 C) (Oral)    Ht 5' 7.5" (1.715 m)    Wt 299 lb 6.4 oz (  135.8 kg)    SpO2 97%    BMI 46.20 kg/m   Body mass index is 46.2 kg/m.  GENERAL: vitals reviewed and listed above, alert, oriented, appears well hydrated and in no acute distress HEENT: atraumatic, conjunctiva  clear, no obvious  abnormalities on inspection of external nose and ears OP : masked  NECK: no obvious masses on inspection palpation  LUNGS: clear to auscultation bilaterally, no wheezes, rales or rhonchi, good air movement CV: HRRR, no clubbing cyanosis or  peripheral edema nl cap refill  PSYCH: pleasant and cooperative, no obvious depression or anxiety Lab Results  Component Value Date   WBC 4.9 08/29/2020   HGB 14.1 08/29/2020   HCT 42.6 08/29/2020   PLT 303.0 08/29/2020   GLUCOSE 101 (H) 08/29/2020   CHOL 132 08/29/2020   TRIG 105.0 08/29/2020   HDL 37.60 (L) 08/29/2020   LDLCALC 73 08/29/2020   ALT 22 08/29/2020   AST 19 08/29/2020   NA 138 08/29/2020   K 4.3 08/29/2020   CL 98 08/29/2020   CREATININE 0.85 08/29/2020   BUN 13 08/29/2020   CO2 30 08/29/2020   TSH 1.14 08/29/2020   HGBA1C 7.0 (A) 01/10/2021   MICROALBUR <0.7 08/29/2020   BP Readings from Last 3 Encounters:  01/10/21 136/82  08/29/20 122/74  02/04/20 126/82    ASSESSMENT AND PLAN:  Discussed the following assessment and plan:  Type 2 diabetes mellitus with hyperglycemia, without long-term current use of insulin (HCC) - Plan: Flu Vaccine QUAD 6+ mos PF IM (Fluarix Quad PF), POC HgB A1c  Medication management  Essential hypertension  Hyperlipidemia, unspecified hyperlipidemia type  Morbid obesity, unspecified obesity type ([place]) Discussed weight loss options in addition that would also help her diabetes. Begin Ozempic 0.25 week increasing to 0.5 mg/week then follow-up in office weight and med check in 3 months. Continue the metformin lifestyle intervention blood pressure control. Uncertain if her insurance [place] would cover Wegovy based on previous experience of prescribing this medicine. Courage meant to continue. -Patient advised to return or notify health care team  if  new concerns arise.  Patient Instructions   Good to see you today /  Continue attention lifestyle intervention healthy eating and exercise .   And weigh tloss .  Ozempic weekly shot  begin starter dose and  increase after a month .   Plan rov in 3 mos check weight  etc at that time .  Standley Brooking. Devion Chriscoe M.D.

## 2021-02-14 ENCOUNTER — Encounter: Payer: Self-pay | Admitting: Internal Medicine

## 2021-02-22 NOTE — Telephone Encounter (Signed)
I have not had success with approvals for [facility] but we can try this.

## 2021-02-27 ENCOUNTER — Ambulatory Visit: Payer: BC Managed Care – PPO | Admitting: Neurology

## 2021-02-28 NOTE — Telephone Encounter (Signed)
Okay to send in Wyn[place] to what ever pharmacy and do a prior Auth if possible This is an old message not sure if it is already been answered.

## 2021-03-01 MED ORDER — TIRZEPATIDE 2.5 MG/0.5ML ~~LOC~~ SOAJ: 2.5000 mg | SUBCUTANEOUS | 1 refills | Status: DC

## 2021-03-01 NOTE — Telephone Encounter (Signed)
Rx sent and pt is aware

## 2021-03-02 NOTE — Telephone Encounter (Signed)
Pt's PA for Darcel Bayley has been denied due to the pt being eligible to try other formulary alternativess such as Ozempic, Rybelsus, Trulicity and Victoza. An appeal can be placed for additional review.

## 2021-03-02 NOTE — Telephone Encounter (Signed)
I initiated a PA for the pt's Mounjaro 2.5mg  pen. The PA has been sent to the pt's insurance for approval or denial. Key- [id number]

## 2021-03-16 ENCOUNTER — Other Ambulatory Visit: Payer: Self-pay | Admitting: Internal Medicine

## 2021-03-17 ENCOUNTER — Ambulatory Visit: Payer: BC Managed Care – PPO | Admitting: Neurology

## 2021-03-22 ENCOUNTER — Other Ambulatory Visit: Payer: Self-pay | Admitting: Neurology

## 2021-04-10 ENCOUNTER — Ambulatory Visit: Payer: BC Managed Care – PPO | Admitting: Internal Medicine

## 2021-04-13 ENCOUNTER — Other Ambulatory Visit: Payer: Self-pay | Admitting: Internal Medicine

## 2021-04-14 NOTE — Telephone Encounter (Signed)
Last Ov 01/10/21 ?Upcoming appt 07/03/21 ?Filled 08/29/20 ?Is it ok to refill? ?

## 2021-05-08 ENCOUNTER — Other Ambulatory Visit: Payer: Self-pay | Admitting: Internal Medicine

## 2021-05-10 ENCOUNTER — Other Ambulatory Visit: Payer: Self-pay

## 2021-05-10 MED ORDER — SEMAGLUTIDE(0.25 OR 0.5MG/DOS) 2 MG/1.5ML ~~LOC~~ SOPN: 0.2500 mg | PEN_INJECTOR | SUBCUTANEOUS | 0 refills | Status: DC

## 2021-06-17 ENCOUNTER — Other Ambulatory Visit: Payer: Self-pay | Admitting: Internal Medicine

## 2021-07-03 ENCOUNTER — Ambulatory Visit: Payer: BC Managed Care – PPO | Admitting: Internal Medicine

## 2021-07-25 ENCOUNTER — Telehealth: Payer: Self-pay | Admitting: Internal Medicine

## 2021-07-25 ENCOUNTER — Encounter: Payer: Self-pay | Admitting: Internal Medicine

## 2021-07-25 ENCOUNTER — Ambulatory Visit (INDEPENDENT_AMBULATORY_CARE_PROVIDER_SITE_OTHER): Payer: BC Managed Care – PPO | Admitting: Internal Medicine

## 2021-07-25 VITALS — BP 136/82 | HR 72 | Temp 98.2°F | Ht 67.5 in | Wt 299.0 lb

## 2021-07-25 DIAGNOSIS — I10 Essential (primary) hypertension: Secondary | ICD-10-CM

## 2021-07-25 DIAGNOSIS — E1165 Type 2 diabetes mellitus with hyperglycemia: Secondary | ICD-10-CM | POA: Diagnosis not present

## 2021-07-25 DIAGNOSIS — Z79899 Other long term (current) drug therapy: Secondary | ICD-10-CM | POA: Diagnosis not present

## 2021-07-25 DIAGNOSIS — K219 Gastro-esophageal reflux disease without esophagitis: Secondary | ICD-10-CM

## 2021-07-25 DIAGNOSIS — Z638 Other specified problems related to primary support group: Secondary | ICD-10-CM

## 2021-07-25 LAB — POCT GLYCOSYLATED HEMOGLOBIN (HGB A1C): Hemoglobin A1C: 7.1 % — AB (ref 4.0–5.6)

## 2021-07-25 MED ORDER — OMEPRAZOLE 20 MG PO CPDR: 20.0000 mg | DELAYED_RELEASE_CAPSULE | Freq: Two times a day (BID) | ORAL | 1 refills | Status: DC

## 2021-07-25 MED ORDER — OZEMPIC (0.25 OR 0.5 MG/DOSE) 2 MG/3ML ~~LOC~~ SOPN: 0.5000 mg | PEN_INJECTOR | SUBCUTANEOUS | 2 refills | Status: DC

## 2021-07-25 NOTE — Patient Instructions (Addendum)
Get back on ozempic  weekly 0.5 mg   After  4 week contact us about how doing  and we can increase to 1 mg dosing as indicated .  Ok to incrase omeprazole to twice a day as needed.   Get fasting lab  appt  august or thereabouts .  Then ROV with results    Start back on the metformin  once a day  and then  twice a day after a few weeks.

## 2021-07-25 NOTE — Telephone Encounter (Signed)
Pt is calling and she needs PA for ozempic must state pt is diabetic type 2 . Please call [phone] for PA  [id number] [place]  [phone] Fax:  [phone]

## 2021-07-25 NOTE — Progress Notes (Signed)
Chief Complaint  Patient presents with   Follow-up    HPI: Beth Wallace 56 y.o. come in for Chronic disease management  Particularly wegiht management Ht taking medication no side effects blood pressure borderline okay still working from home BG thinks it is going to be terrible but started the [place] but then forgot and left it in the car so it was no good and is not taking the phentermine will need a new prescription for Ozempic.  No  side effects had stopped the metformin going back on once a day then twice a day.  Lots of stress related to job situation will now need to come in degrees for a few days a week from [place] also there position may need to be moving to different cities. In the meantime her husband is an alcoholic and is drinking himself all the time making himself sick and he is in "denial" he is using a walker has been hospitalized for DKA and continues to drink.  In regard to reflux and heartburn on omeprazole once a day but occasionally needs to take it twice a day for symptoms mid chest pain with no associated symptoms resolved with the PPI. ROS: See pertinent positives and negatives per HPI.  Certain that her vision is going better though she had an eye check and was told no cataracts no retinal disease or diabetic findings but is worried. No neuropathy sx   Past Medical History:  Diagnosis Date   ADJ DISORDER WITH MIXED ANXIETY & DEPRESSED MOOD 12/14/2009   Qualifier: Diagnosis of  By: Regis Bill MD, Standley Brooking    Allergic reaction 05/18/2011   Allergy    SEASONAL    Anxiety    Diabetes mellitus without complication ([place])    pre diabetic- not started Metformin - diet and exercise to control    GERD (gastroesophageal reflux disease)    Heart murmur    slight years ago    History of gallstones    HTN (hypertension)    Metrorrhagia 12/14/2009   Qualifier: Diagnosis of  By: Regis Bill MD, Standley Brooking With pelvic pain  Sees Dr Philis Pique and tobotic hysterectomy scheduled     Neuromuscular disorder ([place])    cervical issues to arms   Obesity    Rapid palpitations 01/23/2012   Probably related to stress had negative cardiac workup in the past rule out metabolic    TMJ (temporomandibular joint disorder)     Family History  Problem Relation Age of Onset   Heart disease Mother 76       Died age 29, MI   Heart attack Mother    Hypertension Mother    Hypertension Father        Living   Diabetes Father    Heart disease Father    Other Sister        Killed, age 83   Other Daughter        Killed, age 31   Colon polyps Maternal Aunt    Hyperlipidemia Neg Hx    Sudden death Neg Hx    Colon cancer Neg Hx    Esophageal cancer Neg Hx    Rectal cancer Neg Hx    Stomach cancer Neg Hx     Social History   Socioeconomic History   Marital status: Married    Spouse name: Not on file   Number of children: 1   Years of education: 16   Highest education level: Not on file  Occupational History  Occupation: Best boy: [employer]  Tobacco Use   Smoking status: Some Days    Packs/day: 0.25    Types: Cigarettes    Last attempt to quit: 01/22/2012    Years since quitting: 9.5   Smokeless tobacco: Never   Tobacco comments:    1-2 cigarretes daily  Vaping Use   Vaping Use: Never used  Substance and Sexual Activity   Alcohol use: No    Alcohol/week: 0.0 standard drinks of alcohol   Drug use: No   Sexual activity: Not on file  Other Topics Concern   Not on file  Social History Narrative   Orig from [place] and changes for job,     7-8 hours sleep per night     Born in [place], [place],     [employer],     Gaffer,    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.,       Back in school gen ed   At [employer],      [facility] of 2,     BF and dog,     G3P1,     Bereaved parent daughter murdered 2008,   Trial finished  Baldo Ash guilty this week march    No etoh,      No caffeine,     Minimal tobacco,       ETS     Got married feb  14    Right Handed   Social Determinants of Health   Financial Resource Strain: Not on file  Food Insecurity: Not on file  Transportation Needs: Not on file  Physical Activity: Not on file  Stress: Not on file  Social Connections: Not on file    Outpatient Medications Prior to Visit  Medication Sig Dispense Refill   ALPRAZolam (XANAX) 0.5 MG tablet TAKE 1 TABLET BY MOUTH 3 TIMES A DAY AS NEEDED FOR ANXIETY 24 tablet 0   azelastine (ASTELIN) 0.1 % nasal spray 1 2 PUFF IN EACH NOSTRIL TWICE A DAY AS NEEDED NASALLY 30 DAY(S)     Azelastine-Fluticasone 137-50 MCG/ACT SUSP Dymista 137 mcg-50 mcg/spray nasal spray     cetirizine (ZYRTEC) 10 MG tablet Take 10 mg by mouth at bedtime.     chlorhexidine (PERIDEX) 0.12 % solution 15 mLs 2 (two) times daily.     cyclobenzaprine (FLEXERIL) 5 MG tablet Take 5 mg by mouth at bedtime as needed for muscle spasms (neck pain (severe)).     fexofenadine (ALLEGRA) 180 MG tablet Take 180 mg by mouth as needed.     fluticasone (FLONASE) 50 MCG/ACT nasal spray Place 2 sprays into both nostrils daily.     hydrOXYzine (ATARAX/VISTARIL) 10 MG tablet Take 10 mg by mouth 3 (three) times daily as needed for itching.     ibuprofen (ADVIL,MOTRIN) 800 MG tablet TAKE 1 TABLET BY MOUTH EVERY 8 HOURS AS NEEDED FOR CRAMPS 30 tablet 2   lisinopril-hydrochlorothiazide (ZESTORETIC) 20-25 MG tablet Take 1 tablet by mouth daily. 90 tablet 3   metFORMIN (GLUCOPHAGE) 500 MG tablet TAKE 1 TABLET (500 MG TOTAL) BY MOUTH DAILY WITH BREAKFAST. INCREASE TO TWICE A DAY AFTER 3 WEEKS OR AS DIRECTED 180 tablet 1   methocarbamol (ROBAXIN) 500 MG tablet Take 500 mg by mouth daily as needed for muscle spasms (Neck pain).     montelukast (SINGULAIR) 10 MG tablet Take 10 mg by mouth at bedtime.     naproxen (NAPROSYN) 500 MG tablet  TAKE 1 TABLET BY MOUTH EVERY 12 (TWELVE) HOURS AS NEEDED FOR MILD PAIN (NECK PAIN). 60 tablet 1   phentermine 30 MG capsule TAKE 1 CAPSULE BY MOUTH EVERY DAY IN  THE MORNING 30 capsule 0   rosuvastatin (CRESTOR) 10 MG tablet TAKE 1 TABLET BY MOUTH EVERY DAY 90 tablet 3   tiZANidine (ZANAFLEX) 2 MG tablet TAKE 1 TABLET BY MOUTH AT BEDTIME AS NEEDED FOR MUSCLE SPASMS. 90 tablet 1   omeprazole (PRILOSEC) 20 MG capsule Take 1 capsule (20 mg total) by mouth daily. 90 capsule 1   Semaglutide,0.25 or 0.'5MG'$ /DOS, 2 MG/1.5ML SOPN Inject 0.25 mg into the skin once a week. 2 mL 0   tirzepatide (MOUNJARO) 2.5 MG/0.5ML Pen Inject 2.5 mg into the skin once a week. 2 mL 1   No facility-administered medications prior to visit.     EXAM:  BP 136/82 (BP Location: Right Arm, Patient Position: Sitting, Cuff Size: Normal)   Pulse 72   Temp 98.2 F (36.8 C) (Oral)   Ht 5' 7.5" (1.715 m)   Wt 299 lb (135.6 kg)   SpO2 96%   BMI 46.14 kg/m   Body mass index is 46.14 kg/m.  GENERAL: vitals reviewed and listed above, alert, oriented, appears well hydrated and in no acute distress HEENT: atraumatic, conjunctiva  clear, no obvious abnormalities on inspection of external nose and ears NECK: no obvious masses on inspection palpation  LUNGS: clear to auscultation bilaterally, no wheezes, rales or rhonchi, good air movement CV: HRRR, no clubbing cyanosis or  peripheral edema nl cap refill  Abdomen soft without again a megaly guarding or rebound MS: moves all extremities without noticeable focal  abnormality feet  nl no ulcer or  callous  PSYCH: pleasant and cooperative, no obvious depression or anxiety Lab Results  Component Value Date   WBC 4.9 08/29/2020   HGB 14.1 08/29/2020   HCT 42.6 08/29/2020   PLT 303.0 08/29/2020   GLUCOSE 101 (H) 08/29/2020   CHOL 132 08/29/2020   TRIG 105.0 08/29/2020   HDL 37.60 (L) 08/29/2020   LDLCALC 73 08/29/2020   ALT 22 08/29/2020   AST 19 08/29/2020   NA 138 08/29/2020   K 4.3 08/29/2020   CL 98 08/29/2020   CREATININE 0.85 08/29/2020   BUN 13 08/29/2020   CO2 30 08/29/2020   TSH 1.14 08/29/2020   HGBA1C 7.1 (A)  07/25/2021   MICROALBUR <0.7 08/29/2020   BP Readings from Last 3 Encounters:  07/25/21 136/82  01/10/21 136/82  08/29/20 122/74    ASSESSMENT AND PLAN:  Discussed the following assessment and plan:  Type 2 diabetes mellitus with hyperglycemia, without long-term current use of insulin (HCC) - Refill Ozempic at the 0.5 mg/week injection and plan follow-up update month on this dose to decide about increasing doses to 1 mg at that time. - Plan: POC HgB M8U, Basic metabolic panel, CBC with Differential/Platelet, Hemoglobin A1c, Hepatic function panel, Lipid panel, TSH, Microalbumin / creatinine urine ratio  Medication management - Plan: Basic metabolic panel, CBC with Differential/Platelet, Hemoglobin A1c, Hepatic function panel, Lipid panel, TSH, Microalbumin / creatinine urine ratio  Essential hypertension - Plan: Basic metabolic panel, CBC with Differential/Platelet, Hemoglobin A1c, Hepatic function panel, Lipid panel, TSH, Microalbumin / creatinine urine ratio  Morbid obesity, unspecified obesity type ([place]) - Plan: Basic metabolic panel, CBC with Differential/Platelet, Hemoglobin A1c, Hepatic function panel, Lipid panel, TSH, Microalbumin / creatinine urine ratio  Gastroesophageal reflux disease without esophagitis - Okay to take twice daily  omeprazole as needed - Plan: Basic metabolic panel, CBC with Differential/Platelet, Hemoglobin A1c, Hepatic function panel, Lipid panel, TSH, Microalbumin / creatinine urine ratio  Stress due to family tension - Plan: Basic metabolic panel, CBC with Differential/Platelet, Hemoglobin A1c, Hepatic function panel, Lipid panel, TSH, Microalbumin / creatinine urine ratio Need updated lab  Discussed support groups around alcoholism.tend to self first and close follow-up appropriate Counseling regard to family stress in husband's condition garnering support Is utd on  eue exam  -Patient advised to return or notify health care team  if  new concerns  arise.  Patient Instructions  Get back on ozempic  weekly 0.5 mg   After  4 week contact us about how doing  and we can increase to 1 mg dosing as indicated .  Ok to incrase omeprazole to twice a day as needed.   Get fasting lab  appt  august or thereabouts .  Then ROV with results    Start back on the metformin  once a day  and then  twice a day after a few weeks.    Standley Brooking. Jaylan Duggar M.D.

## 2021-07-25 NOTE — Telephone Encounter (Signed)
PA was submitted (Key: [id number])

## 2021-07-28 NOTE — Telephone Encounter (Signed)
PA was denied and awaiting appeal paperwork

## 2021-08-03 NOTE — Telephone Encounter (Signed)
Appeal documents submitted

## 2021-08-04 ENCOUNTER — Other Ambulatory Visit: Payer: Self-pay | Admitting: Internal Medicine

## 2021-08-09 MED ORDER — OZEMPIC (0.25 OR 0.5 MG/DOSE) 2 MG/3ML ~~LOC~~ SOPN: 0.5000 mg | PEN_INJECTOR | SUBCUTANEOUS | 2 refills | Status: DC

## 2021-08-09 NOTE — Addendum Note (Signed)
Addended by: Geradine Girt D on: 08/09/2021 05:01 PM   Modules accepted: Orders

## 2021-08-09 NOTE — Telephone Encounter (Signed)
Appeal approved Rx sent to the pharmacy

## 2021-08-29 ENCOUNTER — Other Ambulatory Visit: Payer: Self-pay | Admitting: Internal Medicine

## 2021-09-04 ENCOUNTER — Ambulatory Visit (INDEPENDENT_AMBULATORY_CARE_PROVIDER_SITE_OTHER): Payer: BC Managed Care – PPO | Admitting: Neurology

## 2021-09-04 ENCOUNTER — Encounter: Payer: Self-pay | Admitting: Neurology

## 2021-09-04 VITALS — BP 156/87 | HR 80 | Ht 67.5 in | Wt 306.0 lb

## 2021-09-04 DIAGNOSIS — G44209 Tension-type headache, unspecified, not intractable: Secondary | ICD-10-CM | POA: Diagnosis not present

## 2021-09-04 DIAGNOSIS — M542 Cervicalgia: Secondary | ICD-10-CM | POA: Diagnosis not present

## 2021-09-04 MED ORDER — TIZANIDINE HCL 2 MG PO TABS
ORAL_TABLET | ORAL | 2 refills | Status: DC
Start: 2021-09-04 — End: 2023-05-27

## 2021-09-04 NOTE — Progress Notes (Signed)
Follow-up Visit   Date: 09/04/21    Beth Wallace MRN: [id number] DOB: 1965/07/09   Interim History: Beth Wallace is a 56 y.o. right-handed African American female with hypertension, seasonal allergies, and GERD returning to the clinic for cervicogenic headaches. The patient was accompanied to the clinic by self.  IMPRESSION/PLAN: Cervicalgia  - Continue tizanidine '2mg'$  at bedtime as needed  - Encouraged to do home neck stretching exercises  - Neck physiotherapy declined at this time   2.  Tension headaches  - Continue NSAIDs  Return to clinic 1 year  -------------------------------------------------------------------- History of present illness: Starting around January 2016, she started developing pressure over the vertex of the head and ocassionally over the forehead.  Pain is improved by walking and resting.  It is worse when she is working and lasts several hours.  It can occur daily but she usually does not notice the discomfort when she is not working.  She has tried excedrin migraine, aleve, and tylenol none which provided significant relief.  She was given prednisone injection but this did not help either.   She previously had migraine when pregnant which would be associated with photophobia and phonophobia.    She is a delightful lady who unfortunately has lost her 18 year old daughter and younger sister in a triple homicide.  She has a tremendous strength and integrity of character and has managed to deal with such tragedy in an effective manner.    In early 2017, she started neck physiotherapy with at least 50% improvement in her head pressure. Starting in late January 2018, she began having neck pain, described as soreness, which sometimes also involves her arms. There is no numbness/tingling or weakness.  She feels that it may be related to how much time she spends on her computer and poor posture so is having an adjustable desk placed at home.  She  tried naproxen which helps.  She also saw a chiropractor and felt that this has improved her range of motion.   UPDATE 09/04/2021:  She is here for follow-up and reports ongoing neck stiffness and pain.  She endorses having a lot of changes as it related to her work at [employer].  Administrators have asked personal to return to the office in [place] or [place] or get furloughed. She is not planning on moving so planning on leaving between November and spring 2024.  She endorses chronic neck pain and stiffness and continues to take tizanidine as needed.  She admits to not doing her neck stretching exercises.  Medications:  Current Outpatient Medications on File Prior to Visit  Medication Sig Dispense Refill   ALPRAZolam (XANAX) 0.5 MG tablet TAKE 1 TABLET BY MOUTH 3 TIMES A DAY AS NEEDED FOR ANXIETY 24 tablet 0   azelastine (ASTELIN) 0.1 % nasal spray 1 2 PUFF IN EACH NOSTRIL TWICE A DAY AS NEEDED NASALLY 30 DAY(S)     Azelastine-Fluticasone 137-50 MCG/ACT SUSP Dymista 137 mcg-50 mcg/spray nasal spray     cetirizine (ZYRTEC) 10 MG tablet Take 10 mg by mouth at bedtime.     chlorhexidine (PERIDEX) 0.12 % solution 15 mLs 2 (two) times daily.     cyclobenzaprine (FLEXERIL) 5 MG tablet Take 5 mg by mouth at bedtime as needed for muscle spasms (neck pain (severe)).     fexofenadine (ALLEGRA) 180 MG tablet Take 180 mg by mouth as needed.     fluticasone (FLONASE) 50 MCG/ACT nasal spray Place 2 sprays into both nostrils daily.  hydrOXYzine (ATARAX/VISTARIL) 10 MG tablet Take 10 mg by mouth 3 (three) times daily as needed for itching.     ibuprofen (ADVIL,MOTRIN) 800 MG tablet TAKE 1 TABLET BY MOUTH EVERY 8 HOURS AS NEEDED FOR CRAMPS 30 tablet 2   lisinopril-hydrochlorothiazide (ZESTORETIC) 20-25 MG tablet TAKE 1 TABLET BY MOUTH EVERY DAY 90 tablet 3   metFORMIN (GLUCOPHAGE) 500 MG tablet TAKE 1 TABLET (500 MG TOTAL) BY MOUTH DAILY WITH BREAKFAST. INCREASE TO TWICE A DAY AFTER 3 WEEKS OR AS DIRECTED 180 tablet  1   methocarbamol (ROBAXIN) 500 MG tablet Take 500 mg by mouth daily as needed for muscle spasms (Neck pain).     montelukast (SINGULAIR) 10 MG tablet Take 10 mg by mouth at bedtime.     naproxen (NAPROSYN) 500 MG tablet TAKE 1 TABLET BY MOUTH EVERY 12 (TWELVE) HOURS AS NEEDED FOR MILD PAIN (NECK PAIN). 60 tablet 1   omeprazole (PRILOSEC) 20 MG capsule Take 1 capsule (20 mg total) by mouth 2 (two) times daily before a meal. As directed 180 capsule 1   phentermine 30 MG capsule TAKE 1 CAPSULE BY MOUTH EVERY DAY IN THE MORNING 30 capsule 0   rosuvastatin (CRESTOR) 10 MG tablet TAKE 1 TABLET BY MOUTH EVERY DAY 90 tablet 0   Semaglutide,0.25 or 0.'5MG'$ /DOS, (OZEMPIC, 0.25 OR 0.5 MG/DOSE,) 2 MG/3ML SOPN Inject 0.5 mg into the skin once a week. 3 mL 2   tiZANidine (ZANAFLEX) 2 MG tablet TAKE 1 TABLET BY MOUTH AT BEDTIME AS NEEDED FOR MUSCLE SPASMS. 90 tablet 1   No current facility-administered medications on file prior to visit.    Allergies:  Allergies  Allergen Reactions   Bee Venom Shortness Of Breath   Contrast Media [Iodinated Contrast Media] Anaphylaxis    CT dye ivp Throat swelling   Other     Vital Signs:  BP (!) 156/87   Pulse 80   Ht 5' 7.5" (1.715 m)   Wt (!) 306 lb (138.8 kg)   SpO2 99%   BMI 47.22 kg/m   Neurological Exam MENTAL STATUS including orientation to time, place, person, recent and remote memory, attention span and concentration, language, and fund of knowledge is normal.  Speech is not dysarthric.  CRANIAL NERVES:  Normal conjugate, extra-ocular eye movements in all directions of gaze.    MOTOR:  Motor strength is 5/5 in all extremities.  She has cervical muscle tension and reduced neck ROM with rotation, neck flexion  COORDINATION/GAIT:   Gait narrow based and stable.   Data: XR cervical spine 12/30/2013:  Mild spondylosis at C5-6.  No acute osseous abnormality.    Thank you for allowing me to participate in patient's care.  If I can answer any  additional questions, I would be pleased to do so.    Sincerely,    Avett Reineck K. Posey Pronto, DO

## 2021-10-03 ENCOUNTER — Encounter: Payer: Self-pay | Admitting: Neurology

## 2021-10-20 ENCOUNTER — Encounter: Payer: Self-pay | Admitting: Allergy & Immunology

## 2021-10-20 ENCOUNTER — Ambulatory Visit (INDEPENDENT_AMBULATORY_CARE_PROVIDER_SITE_OTHER): Payer: BC Managed Care – PPO | Admitting: Allergy & Immunology

## 2021-10-20 VITALS — BP 126/78 | HR 105 | Temp 98.3°F | Resp 16 | Ht 67.0 in | Wt 305.0 lb

## 2021-10-20 DIAGNOSIS — J3089 Other allergic rhinitis: Secondary | ICD-10-CM

## 2021-10-20 DIAGNOSIS — T63481D Toxic effect of venom of other arthropod, accidental (unintentional), subsequent encounter: Secondary | ICD-10-CM

## 2021-10-20 DIAGNOSIS — J310 Chronic rhinitis: Secondary | ICD-10-CM

## 2021-10-20 DIAGNOSIS — L853 Xerosis cutis: Secondary | ICD-10-CM

## 2021-10-20 DIAGNOSIS — J302 Other seasonal allergic rhinitis: Secondary | ICD-10-CM

## 2021-10-20 NOTE — Patient Instructions (Addendum)
1. Chronic rhinitis - Testing today showed: grasses, weeds, trees, dust mites, cat, and dog. - Copy of test results provided.  - Avoidance measures provided. - Stop taking: your current medications - Start taking: Xyzal (levocetirizine) '5mg'$  tablet once daily and Ryaltris (olopatadine/mometasone) two sprays per nostril 1-2 times daily as needed - Sample of Ryaltris provided.  - You can use an extra dose of the antihistamine, if needed, for breakthrough symptoms.  - Consider nasal saline rinses 1-2 times daily to remove allergens from the nasal cavities as well as help with mucous clearance (this is especially helpful to do before the nasal sprays are given) - Strongly consider allergy shots as a means of long-term control. - Allergy shots "re-train" and "reset" the immune system to ignore environmental allergens and decrease the resulting immune response to those allergens (sneezing, itchy watery eyes, runny nose, nasal congestion, etc).    - Allergy shots improve symptoms in 75-85% of patients.   2. Insect sting allergy - We will get records from the other allergy practice. - EpiPen refilled today.  3. Dry skin - Continue with moisturizing as you are doing.  - Your skin looks great!   4. Return in about 3 months (around 01/20/2022).    Please inform us of any Emergency Department visits, hospitalizations, or changes in symptoms. Call us before going to the ED for breathing or allergy symptoms since we might be able to fit you in for a sick visit. Feel free to contact us anytime with any questions, problems, or concerns.  It was a pleasure to meet you today! You are such a SWEETIE!   Websites that have reliable patient information: 1. American Academy of Asthma, Allergy, and Immunology: www.aaaai.org 2. Food Allergy Research and Education (FARE): foodallergy.org 3. Mothers of Asthmatics: http://www.asthmacommunitynetwork.org 4. American College of Allergy, Asthma, and Immunology:  www.acaai.org   COVID-19 Vaccine Information can be found at: [email or web address] For questions related to vaccine distribution or appointments, please email [email or web address] or call [phone].   We realize that you might be concerned about having an allergic reaction to the COVID19 vaccines. To help with that concern, WE ARE OFFERING THE COVID19 VACCINES IN OUR OFFICE! Ask the front desk for dates!     "Like" [name] on Facebook and Instagram for our latest updates!      A healthy democracy works best when [employer] participate! Make sure you are registered to vote! If you have moved or changed any of your contact information, you will need to get this updated before voting!  In some cases, you MAY be able to register to vote online: [email or web address]      Airborne Adult Perc - 10/20/21 1025     Time Antigen Placed 1025    Allergen Manufacturer Lavella Hammock    Location Back    Number of Test 59    1. Control-Buffer 50% Glycerol Negative    2. Control-Histamine 1 mg/ml 3+    3. Albumin saline Negative    4. Mohave Negative    5. [place] Negative    6. Johnson Negative    7. K[place]ns Blue Negative    8. Meadow Fescue Negative    9. Perennial Rye Negative    10. Sweet Vernal Negative    11. Timothy Negative    12. Cocklebur Negative    13. Burweed Marshelder Negative    14. Ragweed, short 3+    15. Ragweed, Giant Negative    16. Plantain,  English Negative    17.  Lamb's Quarters Negative    18. Sheep Sorrell Negative    19. Rough Pigweed Negative    20. Marsh Elder, Rough Negative    21. Mugwort, Common Negative    22. Ash mix Negative    23. Birch mix Negative    24. Beech American Negative    25. Box, Elder Negative    26. Cedar, red Negative    27. Cottonwood, [place] Negative    28. Elm mix Negative    29. Hickory Negative    30. Maple mix Negative    31. Oak, [place] mix  Negative    32. Pecan Pollen Negative    33. Pine mix Negative    34. Sycamore Eastern Negative    35. Masury, Black Pollen Negative    36. Alternaria alternata Negative    37. Cladosporium Herbarum Negative    38. Aspergillus mix Negative    39. Penicillium mix Negative    40. Bipolaris sorokiniana (Helminthosporium) Negative    41. Drechslera spicifera (Curvularia) Negative    42. Mucor plumbeus Negative    43. Fusarium moniliforme Negative    44. Aureobasidium pullulans (pullulara) Negative    45. Rhizopus oryzae Negative    46. Botrytis cinera Negative    47. Epicoccum nigrum Negative    48. Phoma betae Negative    49. Candida Albicans Negative    50. Trichophyton mentagrophytes Negative    51. Mite, D Farinae  5,000 AU/ml Negative    52. Mite, D Pteronyssinus  5,000 AU/ml Negative    53. Cat Hair 10,000 BAU/ml Negative    54.  Dog Epithelia Negative    55. Mixed Feathers Negative    56. Horse Epithelia Negative    57. Cockroach, German Negative    58. Mouse Negative    59. Tobacco Leaf Negative             Intradermal - 10/20/21 1135     Time Antigen Placed 1135    Allergen Manufacturer Lavella Hammock    Location Back    Number of Test 14    Control Negative    [place] Negative    Johnson Negative    7 Grass Negative    Weed mix 1+    Tree mix 1+    Mold 1 Negative    Mold 2 Negative    Mold 3 Negative    Mold 4 Negative    Cat 2+    Dog 2+    Cockroach Negative    Mite mix 4+              Reducing Pollen Exposure  The American Academy of Allergy, Asthma and Immunology suggests the following steps to reduce your exposure to pollen during allergy seasons.    Do not hang sheets or clothing out to dry; pollen may collect on these items. Do not mow lawns or spend time around freshly cut grass; mowing stirs up pollen. Keep windows closed at night.  Keep car windows closed while driving. Minimize morning activities outdoors, a time when pollen counts are  usually at their highest. Stay indoors as much as possible when pollen counts or humidity is high and on windy days when pollen tends to remain in the air longer. Use air conditioning when possible.  Many air conditioners have filters that trap the pollen spores. Use a HEPA room air filter to remove pollen form the indoor air you breathe.   Control of Dust Mite Allergen    Dust mites  play a major role in allergic asthma and rhinitis.  They occur in environments with high humidity wherever human skin is found.  Dust mites absorb humidity from the atmosphere (ie, they do not drink) and feed on organic matter (including shed human and animal skin).  Dust mites are a microscopic type of insect that you cannot see with the naked eye.  High levels of dust mites have been detected from mattresses, pillows, carpets, upholstered furniture, bed covers, clothes, soft toys and any woven material.  The principal allergen of the dust mite is found in its feces.  A gram of dust may contain 1,000 mites and 250,000 fecal particles.  Mite antigen is easily measured in the air during house cleaning activities.  Dust mites do not bite and do not cause harm to humans, other than by triggering allergies/asthma.    Ways to decrease your exposure to dust mites in your home:  Encase mattresses, box springs and pillows with a mite-impermeable barrier or cover   Wash sheets, blankets and drapes weekly in hot water (130 F) with detergent and dry them in a dryer on the hot setting.  Have the room cleaned frequently with a vacuum cleaner and a damp dust-mop.  For carpeting or rugs, vacuuming with a vacuum cleaner equipped with a high-efficiency particulate air (HEPA) filter.  The dust mite allergic individual should not be in a room which is being cleaned and should wait 1 hour after cleaning before going into the room. Do not sleep on upholstered furniture (eg, couches).   If possible removing carpeting, upholstered furniture  and drapery from the home is ideal.  Horizontal blinds should be eliminated in the rooms where the person spends the most time (bedroom, study, television room).  Washable vinyl, roller-type shades are optimal. Remove all non-washable stuffed toys from the bedroom.  Wash stuffed toys weekly like sheets and blankets above.   Reduce indoor humidity to less than 50%.  Inexpensive humidity monitors can be purchased at most hardware stores.  Do not use a humidifier as can make the problem worse and are not recommended.   Control of Dog or Cat Allergen  Avoidance is the best way to manage a dog or cat allergy. If you have a dog or cat and are allergic to dog or cats, consider removing the dog or cat from the home. If you have a dog or cat but don't want to find it a new home, or if your family wants a pet even though someone in the household is allergic, here are some strategies that may help keep symptoms at bay:  Keep the pet out of your bedroom and restrict it to only a few rooms. Be advised that keeping the dog or cat in only one room will not limit the allergens to that room. Don't pet, hug or kiss the dog or cat; if you do, wash your hands with soap and water. High-efficiency particulate air (HEPA) cleaners run continuously in a bedroom or living room can reduce allergen levels over time. Regular use of a high-efficiency vacuum cleaner or a central vacuum can reduce allergen levels. Giving your dog or cat a bath at least once a week can reduce airborne allergen.   Allergy Shots   Allergies are the result of a chain reaction that starts in the immune system. Your immune system controls how your body defends itself. For instance, if you have an allergy to pollen, your immune system identifies pollen as an invader or allergen.  Your immune system overreacts by producing antibodies called Immunoglobulin E (IgE). These antibodies travel to cells that release chemicals, causing an allergic  reaction.  The concept behind allergy immunotherapy, whether it is received in the form of shots or tablets, is that the immune system can be desensitized to specific allergens that trigger allergy symptoms. Although it requires time and patience, the payback can be long-term relief.  How Do Allergy Shots Work?  Allergy shots work much like a vaccine. Your body responds to injected amounts of a particular allergen given in increasing doses, eventually developing a resistance and tolerance to it. Allergy shots can lead to decreased, minimal or no allergy symptoms.  There generally are two phases: build-up and maintenance. Build-up often ranges from three to six months and involves receiving injections with increasing amounts of the allergens. The shots are typically given once or twice a week, though more rapid build-up schedules are sometimes used.  The maintenance phase begins when the most effective dose is reached. This dose is different for each person, depending on how allergic you are and your response to the build-up injections. Once the maintenance dose is reached, there are longer periods between injections, typically two to four weeks.  Occasionally doctors give cortisone-type shots that can temporarily reduce allergy symptoms. These types of shots are different and should not be confused with allergy immunotherapy shots.  Who Can Be Treated with Allergy Shots?  Allergy shots may be a good treatment approach for people with allergic rhinitis (hay fever), allergic asthma, conjunctivitis (eye allergy) or stinging insect allergy.   Before deciding to begin allergy shots, you should consider:   The length of allergy season and the severity of your symptoms  Whether medications and/or changes to your environment can control your symptoms  Your desire to avoid long-term medication use  Time: allergy immunotherapy requires a major time commitment  Cost: may vary depending on your insurance  coverage  Allergy shots for children age 49 and older are effective and often well tolerated. They might prevent the onset of new allergen sensitivities or the progression to asthma.  Allergy shots are not started on patients who are pregnant but can be continued on patients who become pregnant while receiving them. In some patients with other medical conditions or who take certain common medications, allergy shots may be of risk. It is important to mention other medications you talk to your allergist.   When Will I Feel Better?  Some may experience decreased allergy symptoms during the build-up phase. For others, it may take as long as 12 months on the maintenance dose. If there is no improvement after a year of maintenance, your allergist will discuss other treatment options with you.  If you aren't responding to allergy shots, it may be because there is not enough dose of the allergen in your vaccine or there are missing allergens that were not identified during your allergy testing. Other reasons could be that there are high levels of the allergen in your environment or major exposure to non-allergic triggers like tobacco smoke.  What Is the Length of Treatment?  Once the maintenance dose is reached, allergy shots are generally continued for three to five years. The decision to stop should be discussed with your allergist at that time. Some people may experience a permanent reduction of allergy symptoms. Others may relapse and a longer course of allergy shots can be considered.  What Are the Possible Reactions?  The two types of adverse reactions that  can occur with allergy shots are local and systemic. Common local reactions include very mild redness and swelling at the injection site, which can happen immediately or several hours after. A systemic reaction, which is less common, affects the entire body or a particular body system. They are usually mild and typically respond quickly to  medications. Signs include increased allergy symptoms such as sneezing, a stuffy nose or hives.  Rarely, a serious systemic reaction called anaphylaxis can develop. Symptoms include swelling in the throat, wheezing, a feeling of tightness in the chest, nausea or dizziness. Most serious systemic reactions develop within 30 minutes of allergy shots. This is why it is strongly recommended you wait in your doctor's office for 30 minutes after your injections. Your allergist is trained to watch for reactions, and his or her staff is trained and equipped with the proper medications to identify and treat them.  Who Should Administer Allergy Shots?  The preferred location for receiving shots is your prescribing allergist's office. Injections can sometimes be given at another facility where the physician and staff are trained to recognize and treat reactions, and have received instructions by your prescribing allergist.

## 2021-10-20 NOTE — Progress Notes (Signed)
NEW PATIENT  Date of Service/Encounter:  10/20/21  Consult requested by: Burnis Medin, MD   Assessment:   Seasonal and perennial allergic rhinitis (grasses, weeds, trees, dust mites, cat, and dog)  Insect sting allergy - getting blood work today (EpiPen updated)  Dry skin  Plan/Recommendations:    1. Chronic rhinitis - Testing today showed: grasses, weeds, trees, dust mites, cat, and dog. - Copy of test results provided.  - Avoidance measures provided. - Stop taking: your current medications - Start taking: Xyzal (levocetirizine) '5mg'$  tablet once daily and Ryaltris (olopatadine/mometasone) two sprays per nostril 1-2 times daily as needed - Sample of Ryaltris provided.  - You can use an extra dose of the antihistamine, if needed, for breakthrough symptoms.  - Consider nasal saline rinses 1-2 times daily to remove allergens from the nasal cavities as well as help with mucous clearance (this is especially helpful to do before the nasal sprays are given) - Strongly consider allergy shots as a means of long-term control. - Allergy shots "re-train" and "reset" the immune system to ignore environmental allergens and decrease the resulting immune response to those allergens (sneezing, itchy watery eyes, runny nose, nasal congestion, etc).    - Allergy shots improve symptoms in 75-85% of patients.   2. Insect sting allergy - We will get records from the other allergy practice. - EpiPen refilled today.  3. Dry skin - Continue with moisturizing as you are doing.  - Your skin looks great!   4. Return in about 3 months (around 01/20/2022).      This note in its entirety was forwarded to the Provider who requested this consultation.  Subjective:   Beth Wallace is a 56 y.o. female presenting today for evaluation of  Chief Complaint  Patient presents with   Allergic Rhinitis     Stuffy nose, ears, pressure,nose burning  Needs new Epi-Pen    Allergy Testing     Strawberry-itching     Beth Wallace has a history of the following: Patient Active Problem List   Diagnosis Date Noted   Seasonal and perennial allergic rhinitis 10/22/2021   Insect sting allergy, current reaction, accidental or unintentional, subsequent encounter 10/22/2021   Dry skin 10/22/2021   Type 2 diabetes mellitus with hyperglycemia ([facility]) 07/25/2021   BMI 40.0-44.9, adult ([facility]) 04/20/2016   Cervicogenic headache 04/15/2015   Vitamin D deficiency 11/19/2014   Essential hypertension 11/19/2014   Hyperglycemia 09/08/2013   Low HDL (under 40) 04/08/2013   Visit for preventive health examination 04/08/2013   Severe obesity (BMI >= 40) ([facility]) 04/08/2013   Achilles tendinitis 10/08/2012   Back pain 07/08/2012   Sciatica neuralgia 07/08/2012   Stress 01/23/2012   H/O bee sting allergy 05/18/2011   GERD (gastroesophageal reflux disease) 04/12/2011   Dysphagia, unspecified(787.20) 04/12/2011   OBESITY 01/31/2010   HYPERTENSION 12/14/2009   BACK PAIN WITH RADICULOPATHY 12/14/2009    History obtained from: chart review and patient.  Beth Wallace was referred by Panosh, Standley Brooking, MD.     Beth Wallace is a 56 y.o. female presenting for an evaluation of allergies . She was born in [place] and then her family moved to [place] and [place]. She has been here for nearly 10 years or so. She came back to be closer to her family.   She was previously followed by Dr. Fredderick Phenix and she was fired after 7-8 years. She did not miss any appointments at all. She is unclear why she was discharged from the practice.  Allergic Rhinitis Symptom History: She has a history of sinus issues.  She is allergic to a number of items. She never was on shots, but he was strongly encouraging her to travel down there once a week. She does not have her previous results. Her worse time of the year is all year. She was never on shots at all. In fact, her symptoms did not even start until she moved  here. She tells me that Dr. Fredderick Phenix would retest her every year or so for unknown reasons., She has never been on allergy shots at all.   She gets antibiotics around twice per year. She has used a number of nose sprays. She has tried multiple ones without a problem. She typically only get antibiotics for sinus disease. She denies any pneumonias or other issues.   Food Allergy Symptom History: She had a reaction to strawberries a couple of years ago when she had some throat allergies.  She otherwise tolerates all of the major food allergens without adverse event.   Skin Symptom History: She has been itching for a number of years.  She was going one day per week. But now they are requiring her to come in five days per week.  She has very dry skin.  She was stung by a bee in [place]. She apparently was tested in a number of occasions with Dr. Fredderick Phenix. She has never been venom immunotherapy.   Her daughter and her sister were murdered in a home invasion when they were living in [place]. She works for [employer] now, but she is planning to open up event center in [place]. 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   Otherwise, there is no history of other atopic diseases, including drug allergies, urticaria, or contact dermatitis. There is no significant infectious history. Vaccinations are up to date.    Past Medical History: Patient Active Problem List   Diagnosis Date Noted   Seasonal and perennial allergic rhinitis 10/22/2021   Insect sting allergy, current reaction, accidental or unintentional, subsequent encounter 10/22/2021   Dry skin 10/22/2021   Type 2 diabetes mellitus with hyperglycemia ([facility]) 07/25/2021   BMI 40.0-44.9, adult ([facility]) 04/20/2016   Cervicogenic headache 04/15/2015   Vitamin D deficiency 11/19/2014   Essential hypertension 11/19/2014   Hyperglycemia 09/08/2013   Low HDL (under 40) 04/08/2013   Visit for preventive health examination 04/08/2013   Severe obesity  (BMI >= 40) ([facility]) 04/08/2013   Achilles tendinitis 10/08/2012   Back pain 07/08/2012   Sciatica neuralgia 07/08/2012   Stress 01/23/2012   H/O bee sting allergy 05/18/2011   GERD (gastroesophageal reflux disease) 04/12/2011   Dysphagia, unspecified(787.20) 04/12/2011   OBESITY 01/31/2010   HYPERTENSION 12/14/2009   BACK PAIN WITH RADICULOPATHY 12/14/2009    Medication List:  Allergies as of 10/20/2021       Reactions   Bee Venom Shortness Of Breath   Contrast Media [iodinated Contrast Media] Anaphylaxis   CT dye ivp Throat swelling   Other         Medication List        Accurate as of October 20, 2021 11:59 PM. If you have any questions, ask your nurse or doctor.          ALPRAZolam 0.5 MG tablet Commonly known as: XANAX TAKE 1 TABLET BY MOUTH 3 TIMES A DAY AS NEEDED FOR ANXIETY   Auvi-Q 0.3 mg/0.3 mL Soaj injection Generic drug: EPINEPHrine See admin instructions.   azelastine 0.1 %  nasal spray Commonly known as: ASTELIN 1 2 PUFF IN EACH NOSTRIL TWICE A DAY AS NEEDED NASALLY 30 DAY(S)   Azelastine-Fluticasone 137-50 MCG/ACT Susp Dymista 137 mcg-50 mcg/spray nasal spray   cetirizine 10 MG tablet Commonly known as: ZYRTEC Take 10 mg by mouth at bedtime.   chlorhexidine 0.12 % solution Commonly known as: PERIDEX 15 mLs 2 (two) times daily.   cyclobenzaprine 5 MG tablet Commonly known as: FLEXERIL Take 5 mg by mouth at bedtime as needed for muscle spasms (neck pain (severe)).   fexofenadine 180 MG tablet Commonly known as: ALLEGRA Take 180 mg by mouth as needed.   fluticasone 50 MCG/ACT nasal spray Commonly known as: FLONASE Place 2 sprays into both nostrils daily.   hydrOXYzine 10 MG tablet Commonly known as: ATARAX Take 10 mg by mouth 3 (three) times daily as needed for itching.   ibuprofen 800 MG tablet Commonly known as: ADVIL TAKE 1 TABLET BY MOUTH EVERY 8 HOURS AS NEEDED FOR CRAMPS   lisinopril-hydrochlorothiazide 20-25 MG  tablet Commonly known as: ZESTORETIC TAKE 1 TABLET BY MOUTH EVERY DAY   metFORMIN 500 MG tablet Commonly known as: GLUCOPHAGE TAKE 1 TABLET (500 MG TOTAL) BY MOUTH DAILY WITH BREAKFAST. INCREASE TO TWICE A DAY AFTER 3 WEEKS OR AS DIRECTED   montelukast 10 MG tablet Commonly known as: SINGULAIR Take 10 mg by mouth at bedtime.   naproxen 500 MG tablet Commonly known as: NAPROSYN TAKE 1 TABLET BY MOUTH EVERY 12 (TWELVE) HOURS AS NEEDED FOR MILD PAIN (NECK PAIN).   omeprazole 20 MG capsule Commonly known as: PRILOSEC Take 1 capsule (20 mg total) by mouth 2 (two) times daily before a meal. As directed   Ozempic (0.25 or 0.5 MG/DOSE) 2 MG/3ML Sopn Generic drug: Semaglutide(0.25 or 0.'5MG'$ /DOS) Inject 0.5 mg into the skin once a week.   phentermine 30 MG capsule TAKE 1 CAPSULE BY MOUTH EVERY DAY IN THE MORNING   rosuvastatin 10 MG tablet Commonly known as: CRESTOR TAKE 1 TABLET BY MOUTH EVERY DAY   tiZANidine 2 MG tablet Commonly known as: ZANAFLEX TAKE 1 TABLET BY MOUTH AT BEDTIME AS NEEDED FOR MUSCLE SPASMS.        Birth History: non-contributory  Developmental History: non-contributory  Past Surgical History: Past Surgical History:  Procedure Laterality Date   ABDOMINAL HYSTERECTOMY  09/2010   partial   CESAREAN SECTION     CHOLECYSTECTOMY  1989   UPPER GASTROINTESTINAL ENDOSCOPY     WISDOM TOOTH EXTRACTION  2010     Family History: Family History  Problem Relation Age of Onset   Heart disease Mother 31       Died age 39, MI   Heart attack Mother    Hypertension Mother    Hypertension Father        Living   Diabetes Father    Heart disease Father    Other Sister        Killed, age 66   Other Daughter        Killed, age 66   Colon polyps Maternal Aunt    Hyperlipidemia Neg Hx    Sudden death Neg Hx    Colon cancer Neg Hx    Esophageal cancer Neg Hx    Rectal cancer Neg Hx    Stomach cancer Neg Hx      Social History: Beth Wallace lives at home with  her family.  They live in a house that is 54 years old.  There is wood and carpeting throughout  the home.  They have voided bedrooms.  There is electric heating and central cooling.  There is one Yorkie inside of the home.  There are no dust mite covers on the bedding.  There is tobacco exposure in the home.  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  She is not exposed to fumes, chemicals, or dust.  She does not use a HEPA filter.  She does not live near an interstate or industrial area. She smoked from 31 through 2008.    Review of Systems  Constitutional: Negative.  Negative for chills, fever, malaise/fatigue and weight loss.  HENT:  Positive for congestion. Negative for ear discharge and ear pain.        Positive for postnasal drip. Positive for throat clearing.   Eyes:  Negative for pain, discharge and redness.  Respiratory:  Negative for cough, sputum production, shortness of breath and wheezing.   Cardiovascular: Negative.  Negative for chest pain and palpitations.  Gastrointestinal:  Negative for abdominal pain, constipation, diarrhea, heartburn, nausea and vomiting.  Skin: Negative.  Negative for itching and rash.  Neurological:  Negative for dizziness and headaches.  Endo/Heme/Allergies:  Negative for environmental allergies. Does not bruise/bleed easily.       Objective:   Blood pressure 126/78, pulse (!) 105, temperature 98.3 F (36.8 C), resp. rate 16, height '5\' 7"'$  (1.702 m), weight (!) 305 lb (138.3 kg), SpO2 95 %. Body mass index is 47.77 kg/m.     Physical Exam Vitals reviewed.  Constitutional:      Appearance: She is well-developed.  HENT:     Head: Normocephalic and atraumatic.     Right Ear: Tympanic membrane, ear canal and external ear normal. No drainage, swelling or tenderness. Tympanic membrane is not injected, scarred, erythematous, retracted or bulging.     Left Ear: Tympanic membrane, ear canal and external ear normal. No  drainage, swelling or tenderness. Tympanic membrane is not injected, scarred, erythematous, retracted or bulging.     Nose: No nasal deformity, septal deviation, mucosal edema or rhinorrhea.     Right Turbinates: Enlarged, swollen and pale.     Left Turbinates: Enlarged, swollen and pale.     Right Sinus: No maxillary sinus tenderness or frontal sinus tenderness.     Left Sinus: No maxillary sinus tenderness or frontal sinus tenderness.     Mouth/Throat:     Mouth: Mucous membranes are not pale and not dry.     Pharynx: Uvula midline.  Eyes:     General:        Right eye: No discharge.        Left eye: No discharge.     Conjunctiva/sclera: Conjunctivae normal.     Right eye: Right conjunctiva is not injected. No chemosis.    Left eye: Left conjunctiva is not injected. No chemosis.    Pupils: Pupils are equal, round, and reactive to light.  Cardiovascular:     Rate and Rhythm: Normal rate and regular rhythm.     Heart sounds: Normal heart sounds.  Pulmonary:     Effort: Pulmonary effort is normal. No tachypnea, accessory muscle usage or respiratory distress.     Breath sounds: Normal breath sounds. No wheezing, rhonchi or rales.     Comments: Moving air well in all lung fields. No increased work of breathing noted.  Chest:     Chest wall: No tenderness.  Abdominal:     Tenderness: There is no abdominal tenderness. There  is no guarding or rebound.  Lymphadenopathy:     Head:     Right side of head: No submandibular, tonsillar or occipital adenopathy.     Left side of head: No submandibular, tonsillar or occipital adenopathy.     Cervical: No cervical adenopathy.  Skin:    General: Skin is warm.     Capillary Refill: Capillary refill takes less than 2 seconds.     Coloration: Skin is not pale.     Findings: No abrasion, erythema, petechiae or rash. Rash is not papular, urticarial or vesicular.  Neurological:     Mental Status: She is alert.  Psychiatric:        Behavior:  Behavior is cooperative.      Diagnostic studies:   Allergy Studies:     Airborne Adult Perc - 10/20/21 1025     Time Antigen Placed 1025    Allergen Manufacturer Lavella Hammock    Location Back    Number of Test 59    1. Control-Buffer 50% Glycerol Negative    2. Control-Histamine 1 mg/ml 3+    3. Albumin saline Negative    4. [place] Negative    5. [place] Negative    6. Johnson Negative    7. [place] Blue Negative    8. Meadow Fescue Negative    9. Perennial Rye Negative    10. Sweet Vernal Negative    11. Timothy Negative    12. Cocklebur Negative    13. Burweed Marshelder Negative    14. Ragweed, short 3+    15. Ragweed, Giant Negative    16. Plantain,  English Negative    17. Lamb's Quarters Negative    18. Sheep Sorrell Negative    19. Rough Pigweed Negative    20. Marsh Elder, Rough Negative    21. Mugwort, Common Negative    22. Ash mix Negative    23. Birch mix Negative    24. Beech American Negative    25. Box, Elder Negative    26. Cedar, red Negative    27. Cottonwood, [place] Negative    28. Elm mix Negative    29. Hickory Negative    30. Maple mix Negative    31. Oak, [place] mix Negative    32. Pecan Pollen Negative    33. Pine mix Negative    34. Sycamore Eastern Negative    35. [place], Black Pollen Negative    36. Alternaria alternata Negative    37. Cladosporium Herbarum Negative    38. Aspergillus mix Negative    39. Penicillium mix Negative    40. Bipolaris sorokiniana (Helminthosporium) Negative    41. Drechslera spicifera (Curvularia) Negative    42. Mucor plumbeus Negative    43. Fusarium moniliforme Negative    44. Aureobasidium pullulans (pullulara) Negative    45. Rhizopus oryzae Negative    46. Botrytis cinera Negative    47. Epicoccum nigrum Negative    48. Phoma betae Negative    49. Candida Albicans Negative    50. Trichophyton mentagrophytes Negative    51. Mite, D Farinae  5,000 AU/ml Negative    52. Mite, D Pteronyssinus  5,000  AU/ml Negative    53. Cat Hair 10,000 BAU/ml Negative    54.  Dog Epithelia Negative    55. Mixed Feathers Negative    56. Horse Epithelia Negative    57. Cockroach, German Negative    58. Mouse Negative    59. Tobacco Leaf Negative  Intradermal - 10/20/21 1135     Time Antigen Placed 1135    Allergen Manufacturer Lavella Hammock    Location Back    Number of Test 14    Control Negative    Guatemala Negative    Johnson Negative    7 Grass Negative    Weed mix 1+    Tree mix 1+    Mold 1 Negative    Mold 2 Negative    Mold 3 Negative    Mold 4 Negative    Cat 2+    Dog 2+    Cockroach Negative    Mite mix 4+             Allergy testing results were read and interpreted by myself, documented by clinical staff.         Salvatore Marvel, MD [facility]

## 2021-10-22 ENCOUNTER — Encounter: Payer: Self-pay | Admitting: Allergy & Immunology

## 2021-10-22 DIAGNOSIS — T63481D Toxic effect of venom of other arthropod, accidental (unintentional), subsequent encounter: Secondary | ICD-10-CM | POA: Insufficient documentation

## 2021-10-22 DIAGNOSIS — L853 Xerosis cutis: Secondary | ICD-10-CM | POA: Insufficient documentation

## 2021-10-22 DIAGNOSIS — J302 Other seasonal allergic rhinitis: Secondary | ICD-10-CM | POA: Insufficient documentation

## 2021-11-19 ENCOUNTER — Other Ambulatory Visit: Payer: Self-pay | Admitting: Internal Medicine

## 2022-01-19 ENCOUNTER — Ambulatory Visit: Payer: BC Managed Care – PPO | Admitting: Allergy & Immunology

## 2022-01-30 ENCOUNTER — Ambulatory Visit: Payer: BC Managed Care – PPO

## 2022-02-02 ENCOUNTER — Ambulatory Visit (INDEPENDENT_AMBULATORY_CARE_PROVIDER_SITE_OTHER): Payer: BC Managed Care – PPO

## 2022-02-02 DIAGNOSIS — Z23 Encounter for immunization: Secondary | ICD-10-CM | POA: Diagnosis not present

## 2022-02-06 ENCOUNTER — Ambulatory Visit: Payer: BC Managed Care – PPO

## 2022-02-25 ENCOUNTER — Other Ambulatory Visit: Payer: Self-pay | Admitting: Internal Medicine

## 2022-04-06 LAB — HM MAMMOGRAPHY

## 2022-04-19 ENCOUNTER — Encounter: Payer: BC Managed Care – PPO | Admitting: Internal Medicine

## 2022-04-20 ENCOUNTER — Ambulatory Visit: Payer: BC Managed Care – PPO | Admitting: Allergy & Immunology

## 2022-04-29 ENCOUNTER — Other Ambulatory Visit: Payer: Self-pay | Admitting: Internal Medicine

## 2022-05-18 ENCOUNTER — Encounter: Payer: Self-pay | Admitting: Allergy & Immunology

## 2022-05-18 ENCOUNTER — Encounter: Payer: Self-pay | Admitting: Neurology

## 2022-05-22 ENCOUNTER — Other Ambulatory Visit: Payer: Self-pay | Admitting: Family

## 2022-05-22 ENCOUNTER — Ambulatory Visit (INDEPENDENT_AMBULATORY_CARE_PROVIDER_SITE_OTHER): Payer: BC Managed Care – PPO | Admitting: Neurology

## 2022-05-22 ENCOUNTER — Encounter: Payer: Self-pay | Admitting: Neurology

## 2022-05-22 VITALS — BP 126/90 | HR 76 | Ht 67.0 in | Wt 311.0 lb

## 2022-05-22 DIAGNOSIS — M5431 Sciatica, right side: Secondary | ICD-10-CM | POA: Diagnosis not present

## 2022-05-22 DIAGNOSIS — M542 Cervicalgia: Secondary | ICD-10-CM | POA: Diagnosis not present

## 2022-05-22 DIAGNOSIS — G44209 Tension-type headache, unspecified, not intractable: Secondary | ICD-10-CM

## 2022-05-22 MED ORDER — CYCLOBENZAPRINE HCL 5 MG PO TABS: 5.0000 mg | ORAL_TABLET | Freq: Every evening | ORAL | 5 refills | Status: DC | PRN

## 2022-05-22 MED ORDER — KETOROLAC TROMETHAMINE 60 MG/2ML IM SOLN
60.0000 mg | Freq: Once | INTRAMUSCULAR | Status: AC
  Administered 2022-05-22: 60 mg via INTRAMUSCULAR

## 2022-05-22 NOTE — Patient Instructions (Signed)
It was great to see you today!  For your sciatica, we will give toradol injection today  Referral to start physical therapy for low back strengthening and stretching  If your pain does not improve with therapy, please let me know and we can consider imaging of your low back.   Return to clinic in 1 year

## 2022-05-22 NOTE — Progress Notes (Signed)
Follow-up Visit   Date: 05/22/22    Beth Wallace MRN: [id number] DOB: [birth year]   Interim History: Beth Wallace is a 57 y.o. right-handed African American female with hypertension, seasonal allergies, and GERD returning to the clinic for cervicogenic headaches. The patient was accompanied to the clinic by self.  IMPRESSION/PLAN: Right sciatica, acute  - Toradol 60mg  injection today  - Continue flexeril 5mg  as needed  - Start PT for low back strengthening  - Consider MRI lumbar spine, if symptoms do not improve  Cervicalgia, stable - Continue tizanidine 2mg  at bedtime as needed  - Continue home neck stretches   3.   Tension headaches, stable  - Continue NSAIDs  Return to clinic 1 year  -------------------------------------------------------------------- History of present illness: Starting around January 2016, she started developing pressure over the vertex of the head and ocassionally over the forehead.  Pain is improved by walking and resting.  It is worse when she is working and lasts several hours.  It can occur daily but she usually does not notice the discomfort when she is not working.  She has tried excedrin migraine, aleve, and tylenol none which provided significant relief.  She was given prednisone injection but this did not help either.   She previously had migraine when pregnant which would be associated with photophobia and phonophobia.    She is a delightful lady who unfortunately has lost her 87 year old daughter and younger sister in a triple homicide.  She has a tremendous strength and integrity of character and has managed to deal with such tragedy in an effective manner.    In early 2017, she started neck physiotherapy with at least 50% improvement in her head pressure. Starting in late January 2018, she began having neck pain, described as soreness, which sometimes also involves her arms. There is no numbness/tingling or weakness.  She  feels that it may be related to how much time she spends on her computer and poor posture so is having an adjustable desk placed at home.  She tried naproxen which helps.  She also saw a chiropractor and felt that this has improved her range of motion.   UPDATE 09/04/2021:  She is here for follow-up and reports ongoing neck stiffness and pain.  She endorses having a lot of changes as it related to her work at [employer].  Administrators have asked personal to return to the office in [place] or [place] or get furloughed. She is not planning on moving so planning on leaving between November and spring 2024.  She endorses chronic neck pain and stiffness and continues to take tizanidine as needed.  She admits to not doing her neck stretching exercises.  UPDATE 05/22/2022:  She went on a trip to [place] in mid-April and when she returned home, she began having severe shooting and achy right low back pain radiating into the right leg and foot.  Symptoms are constant and worse with sitting.  She is taking tizanidine and ibuprofen.  She reports having flares of pain since 2000 when she was diagnosed with sciatica.  The last time she has severe exacerbation of pain was in 2014.  She has difficulty with walking long distances and cannot get comfortable during the visit today.  Headaches are stable and responsive to NSAIDs and tizanidine as needed.   Medications:  Current Outpatient Medications on File Prior to Visit  Medication Sig Dispense Refill   ALPRAZolam (XANAX) 0.5 MG tablet TAKE 1 TABLET BY MOUTH 3 TIMES A DAY  AS NEEDED FOR ANXIETY 24 tablet 0   azelastine (ASTELIN) 0.1 % nasal spray 1 2 PUFF IN EACH NOSTRIL TWICE A DAY AS NEEDED NASALLY 30 DAY(S)     cetirizine (ZYRTEC) 10 MG tablet Take 10 mg by mouth at bedtime.     chlorhexidine (PERIDEX) 0.12 % solution 15 mLs 2 (two) times daily.     cyclobenzaprine (FLEXERIL) 5 MG tablet Take 5 mg by mouth at bedtime as needed for muscle spasms (neck pain (severe)).      EPINEPHrine (AUVI-Q) 0.3 mg/0.3 mL IJ SOAJ injection See admin instructions.     fexofenadine (ALLEGRA) 180 MG tablet Take 180 mg by mouth as needed.     fluticasone (FLONASE) 50 MCG/ACT nasal spray Place 2 sprays into both nostrils daily.     hydrOXYzine (ATARAX/VISTARIL) 10 MG tablet Take 10 mg by mouth 3 (three) times daily as needed for itching.     ibuprofen (ADVIL,MOTRIN) 800 MG tablet TAKE 1 TABLET BY MOUTH EVERY 8 HOURS AS NEEDED FOR CRAMPS 30 tablet 2   lisinopril-hydrochlorothiazide (ZESTORETIC) 20-25 MG tablet TAKE 1 TABLET BY MOUTH EVERY DAY 90 tablet 3   omeprazole (PRILOSEC) 20 MG capsule TAKE 1 CAPSULE (20 MG TOTAL) BY MOUTH 2 (TWO) TIMES DAILY BEFORE A MEAL. AS DIRECTED 180 capsule 1   phentermine 30 MG capsule TAKE 1 CAPSULE BY MOUTH EVERY DAY IN THE MORNING 30 capsule 0   rosuvastatin (CRESTOR) 10 MG tablet TAKE 1 TABLET BY MOUTH EVERY DAY 90 tablet 0   Semaglutide,0.25 or 0.5MG /DOS, (OZEMPIC, 0.25 OR 0.5 MG/DOSE,) 2 MG/3ML SOPN Inject 0.5 mg into the skin once a week. 3 mL 2   tiZANidine (ZANAFLEX) 2 MG tablet TAKE 1 TABLET BY MOUTH AT BEDTIME AS NEEDED FOR MUSCLE SPASMS. 90 tablet 2   Azelastine-Fluticasone 137-50 MCG/ACT SUSP Dymista 137 mcg-50 mcg/spray nasal spray (Patient not taking: Reported on 10/20/2021)     metFORMIN (GLUCOPHAGE) 500 MG tablet TAKE 1 TABLET (500 MG TOTAL) BY MOUTH DAILY WITH BREAKFAST. INCREASE TO TWICE A DAY AFTER 3 WEEKS OR AS DIRECTED (Patient not taking: Reported on 05/22/2022) 180 tablet 1   montelukast (SINGULAIR) 10 MG tablet Take 10 mg by mouth at bedtime. (Patient not taking: Reported on 10/20/2021)     naproxen (NAPROSYN) 500 MG tablet TAKE 1 TABLET BY MOUTH EVERY 12 (TWELVE) HOURS AS NEEDED FOR MILD PAIN (NECK PAIN). (Patient not taking: Reported on 10/20/2021) 60 tablet 1   No current facility-administered medications on file prior to visit.    Allergies:  Allergies  Allergen Reactions   Bee Venom Shortness Of Breath   Contrast Media  [Iodinated Contrast Media] Anaphylaxis    CT dye ivp Throat swelling   Other     Vital Signs:  BP (!) 146/91   Pulse 76   Ht 5\' 7"  (1.702 m)   Wt (!) 311 lb (141.1 kg)   SpO2 94%   BMI 48.71 kg/m   Neurological Exam MENTAL STATUS including orientation to time, place, person, recent and remote memory, attention span and concentration, language, and fund of knowledge is normal.  Speech is not dysarthric.  CRANIAL NERVES:  Normal conjugate, extra-ocular eye movements in all directions of gaze.    MOTOR:  Motor strength is 5/5 in all extremities, athough there is pain-limiting weakness in the right leg diffusely.  Straight leg raise is positive on the right.   REFLEXES:  Reflexes are 2+/4 throughout.   COORDINATION/GAIT:   Gait narrow based and stable.  Data: XR cervical spine 12/30/2013:  Mild spondylosis at C5-6.  No acute osseous abnormality.    Thank you for allowing me to participate in patient's care.  If I can answer any additional questions, I would be pleased to do so.    Sincerely,    Pascha Fogal K. Allena Katz, DO

## 2022-05-24 ENCOUNTER — Ambulatory Visit: Payer: BC Managed Care – PPO | Admitting: Internal Medicine

## 2022-05-25 ENCOUNTER — Other Ambulatory Visit (HOSPITAL_BASED_OUTPATIENT_CLINIC_OR_DEPARTMENT_OTHER): Payer: Self-pay | Admitting: Medical

## 2022-05-25 DIAGNOSIS — M79661 Pain in right lower leg: Secondary | ICD-10-CM

## 2022-05-26 ENCOUNTER — Ambulatory Visit (HOSPITAL_BASED_OUTPATIENT_CLINIC_OR_DEPARTMENT_OTHER): Payer: BC Managed Care – PPO

## 2022-05-29 ENCOUNTER — Ambulatory Visit (HOSPITAL_COMMUNITY)
Admission: RE | Admit: 2022-05-29 | Discharge: 2022-05-29 | Disposition: A | Discharge status: Home / Self Care | Payer: BC Managed Care – PPO | Source: Ambulatory Visit | Attending: Cardiology | Admitting: Cardiology

## 2022-05-29 DIAGNOSIS — M79662 Pain in left lower leg: Secondary | ICD-10-CM | POA: Insufficient documentation

## 2022-05-29 DIAGNOSIS — M79661 Pain in right lower leg: Secondary | ICD-10-CM | POA: Diagnosis present

## 2022-05-31 ENCOUNTER — Encounter: Payer: Self-pay | Admitting: Internal Medicine

## 2022-05-31 ENCOUNTER — Other Ambulatory Visit: Payer: Self-pay | Admitting: Internal Medicine

## 2022-05-31 ENCOUNTER — Ambulatory Visit (INDEPENDENT_AMBULATORY_CARE_PROVIDER_SITE_OTHER): Payer: BC Managed Care – PPO | Admitting: Internal Medicine

## 2022-05-31 VITALS — BP 134/90 | HR 74 | Temp 98.3°F | Ht 67.0 in | Wt 306.8 lb

## 2022-05-31 DIAGNOSIS — Z79899 Other long term (current) drug therapy: Secondary | ICD-10-CM

## 2022-05-31 DIAGNOSIS — I10 Essential (primary) hypertension: Secondary | ICD-10-CM

## 2022-05-31 DIAGNOSIS — E785 Hyperlipidemia, unspecified: Secondary | ICD-10-CM

## 2022-05-31 DIAGNOSIS — Z6841 Body Mass Index (BMI) 40.0 and over, adult: Secondary | ICD-10-CM

## 2022-05-31 DIAGNOSIS — Z7985 Long-term (current) use of injectable non-insulin antidiabetic drugs: Secondary | ICD-10-CM | POA: Diagnosis not present

## 2022-05-31 DIAGNOSIS — T887XXA Unspecified adverse effect of drug or medicament, initial encounter: Secondary | ICD-10-CM

## 2022-05-31 DIAGNOSIS — E1165 Type 2 diabetes mellitus with hyperglycemia: Secondary | ICD-10-CM

## 2022-05-31 DIAGNOSIS — M25561 Pain in right knee: Secondary | ICD-10-CM | POA: Diagnosis not present

## 2022-05-31 DIAGNOSIS — Z0000 Encounter for general adult medical examination without abnormal findings: Secondary | ICD-10-CM

## 2022-05-31 LAB — POCT GLYCOSYLATED HEMOGLOBIN (HGB A1C): Hemoglobin A1C: 7.1 % — AB (ref 4.0–5.6)

## 2022-05-31 MED ORDER — TRAMADOL HCL 50 MG PO TABS: 50.0000 mg | ORAL_TABLET | Freq: Four times a day (QID) | ORAL | 0 refills | Status: AC | PRN

## 2022-05-31 MED ORDER — TIRZEPATIDE 2.5 MG/0.5ML ~~LOC~~ SOAJ
2.5000 mg | SUBCUTANEOUS | 1 refills | Status: DC
Start: 2022-05-31 — End: 2022-08-08

## 2022-05-31 MED ORDER — ROSUVASTATIN CALCIUM 10 MG PO TABS: 10.0000 mg | ORAL_TABLET | Freq: Every day | ORAL | 0 refills | Status: DC

## 2022-05-31 NOTE — Progress Notes (Signed)
[facility] labs

## 2022-05-31 NOTE — Progress Notes (Addendum)
Chief Complaint  Patient presents with   Leg Pain    Pt c/o R leg pain. Noticed it end of April. Pain located behind the knee. Pt reports she has sciatica pain going on for a while. Often radiates to her R leg. Wonder if sciatica caused her sx. Denied injury.     HPI: Beth Wallace 57 y.o. come in for  Onset after travel  sciatica but then severe r knees leg pain  not sure why no discrete injury  No help with multiple nsaids and  topical methods  Ortho  neg knee x ray .  Neg dvt cyst   what next?  Dr Allena Katz  ordered pt but not sure the cause of sx  Knee feels swollen and sometimes unstable   Also needs crestor refill something for pain  Update  dm med didn't like se of ozempic for weeks   willing to ttyr mouunjaro  ROS: See pertinent positives and negatives per HPI.  Past Medical History:  Diagnosis Date   ADJ DISORDER WITH MIXED ANXIETY & DEPRESSED MOOD 12/14/2009   Qualifier: Diagnosis of  By: Fabian Sharp MD, [name]    Allergic reaction 05/18/2011   Allergy    SEASONAL    Anxiety    Diabetes mellitus without complication (HCC)    pre diabetic- not started Metformin - diet and exercise to control    GERD (gastroesophageal reflux disease)    Heart murmur    slight years ago    History of gallstones    HTN (hypertension)    Metrorrhagia 12/14/2009   Qualifier: Diagnosis of  By: Fabian Sharp MD, [name] With pelvic pain  Sees Dr Henderson Cloud and tobotic hysterectomy scheduled    Neuromuscular disorder (HCC)    cervical issues to arms   Obesity    Rapid palpitations 01/23/2012   Probably related to stress had negative cardiac workup in the past rule out metabolic    TMJ (temporomandibular joint disorder)     Family History  Problem Relation Age of Onset   Heart disease Mother 42       Died age 16, MI   Heart attack Mother    Hypertension Mother    Hypertension Father        Living   Diabetes Father    Heart disease Father    Other Sister        Killed, age 18   Other  Daughter        Killed, age 79   Colon polyps Maternal Aunt    Hyperlipidemia Neg Hx    Sudden death Neg Hx    Colon cancer Neg Hx    Esophageal cancer Neg Hx    Rectal cancer Neg Hx    Stomach cancer Neg Hx     Social History   Socioeconomic History   Marital status: Married    Spouse name: Not on file   Number of children: 1   Years of education: 16   Highest education level: Not on file  Occupational History   Occupation: Event organiser: [employer]  Tobacco Use   Smoking status: Some Days    Packs/day: .25    Types: Cigarettes   Smokeless tobacco: Never   Tobacco comments:    Pt.smokes some days   Vaping Use   Vaping Use: Never used  Substance and Sexual Activity   Alcohol use: No    Alcohol/week: 0.0 standard drinks of alcohol   Drug use: No  Sexual activity: Not on file  Other Topics Concern   Not on file  Social History Narrative   Orig from [place] and changes for job,     7-8 hours sleep per night     Born in [place], [place],     [employer] and [employer],     Geographical information systems officer,    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.,       Back in school gen ed   At [employer],      [facility] of [facility],     BF and dog,     G3P1,     Bereaved parent daughter murdered 2008,   Trial finished  Claris Gower guilty this week march    No etoh,      No caffeine,     Minimal tobacco,       ETS     Got married feb 14    Right Handed   Social Determinants of Health   Financial Resource Strain: Patient Declined (05/31/2022)   Overall Financial Resource Strain (CARDIA)    Difficulty of Paying Living Expenses: Patient declined  Food Insecurity: Patient Declined (05/31/2022)   Hunger Vital Sign    Worried About Running Out of Food in the Last Year: Patient declined    Ran Out of Food in the Last Year: Patient declined  Transportation Needs: No Transportation Needs (05/31/2022)   PRAPARE - Administrator, Civil Service (Medical): No    Lack of Transportation  (Non-Medical): No  Physical Activity: Unknown (05/31/2022)   Exercise Vital Sign    Days of Exercise per Week: 0 days    Minutes of Exercise per Session: Not on file  Stress: Stress Concern Present (05/31/2022)   [employer] of Occupational Health - Occupational Stress Questionnaire    Feeling of Stress : To some extent  Social Connections: Unknown (05/31/2022)   Social Connection and Isolation Panel [NHANES]    Frequency of Communication with Friends and Family: Three times a week    Frequency of Social Gatherings with Friends and Family: Once a week    Attends Religious Services: Patient declined    Database administrator or Organizations: No    Attends Engineer, structural: Not on file    Marital Status: Patient declined    Outpatient Medications Prior to Visit  Medication Sig Dispense Refill   ALPRAZolam (XANAX) 0.5 MG tablet TAKE 1 TABLET BY MOUTH 3 TIMES A DAY AS NEEDED FOR ANXIETY 24 tablet 0   azelastine (ASTELIN) 0.1 % nasal spray 1 2 PUFF IN EACH NOSTRIL TWICE A DAY AS NEEDED NASALLY 30 DAY(S)     Azelastine-Fluticasone 137-50 MCG/ACT SUSP      cetirizine (ZYRTEC) 10 MG tablet Take 10 mg by mouth at bedtime.     chlorhexidine (PERIDEX) 0.12 % solution 15 mLs 2 (two) times daily.     cyclobenzaprine (FLEXERIL) 5 MG tablet Take 1 tablet (5 mg total) by mouth at bedtime as needed for muscle spasms (neck pain (severe)). 30 tablet 5   EPINEPHrine (AUVI-Q) 0.3 mg/0.3 mL IJ SOAJ injection See admin instructions.     fexofenadine (ALLEGRA) 180 MG tablet Take 180 mg by mouth as needed.     fluticasone (FLONASE) 50 MCG/ACT nasal spray Place 2 sprays into both nostrils daily.     hydrOXYzine (ATARAX/VISTARIL) 10 MG tablet Take 10 mg by mouth 3 (three) times daily as needed for itching.  ibuprofen (ADVIL,MOTRIN) 800 MG tablet TAKE 1 TABLET BY MOUTH EVERY 8 HOURS AS NEEDED FOR CRAMPS 30 tablet 2   lisinopril-hydrochlorothiazide (ZESTORETIC) 20-25 MG tablet TAKE 1 TABLET BY  MOUTH EVERY DAY 90 tablet 3   omeprazole (PRILOSEC) 20 MG capsule TAKE 1 CAPSULE (20 MG TOTAL) BY MOUTH 2 (TWO) TIMES DAILY BEFORE A MEAL. AS DIRECTED 180 capsule 1   phentermine 30 MG capsule TAKE 1 CAPSULE BY MOUTH EVERY DAY IN THE MORNING 30 capsule 0   tiZANidine (ZANAFLEX) 2 MG tablet TAKE 1 TABLET BY MOUTH AT BEDTIME AS NEEDED FOR MUSCLE SPASMS. 90 tablet 2   rosuvastatin (CRESTOR) 10 MG tablet TAKE 1 TABLET BY MOUTH EVERY DAY 90 tablet 0   Semaglutide,0.25 or 0.5MG /DOS, (OZEMPIC, 0.25 OR 0.5 MG/DOSE,) 2 MG/3ML SOPN Inject 0.5 mg into the skin once a week. 3 mL 2   metFORMIN (GLUCOPHAGE) 500 MG tablet TAKE 1 TABLET (500 MG TOTAL) BY MOUTH DAILY WITH BREAKFAST. INCREASE TO TWICE A DAY AFTER 3 WEEKS OR AS DIRECTED (Patient not taking: Reported on 05/22/2022) 180 tablet 1   naproxen (NAPROSYN) 500 MG tablet TAKE 1 TABLET BY MOUTH EVERY 12 (TWELVE) HOURS AS NEEDED FOR MILD PAIN (NECK PAIN). (Patient not taking: Reported on 10/20/2021) 60 tablet 1   montelukast (SINGULAIR) 10 MG tablet Take 10 mg by mouth at bedtime. (Patient not taking: Reported on 10/20/2021)     No facility-administered medications prior to visit.     EXAM:  BP (!) 134/90 (BP Location: Left Arm, Patient Position: Sitting, Cuff Size: Large)   Pulse 74   Temp 98.3 F (36.8 C) (Oral)   Ht 5\' 7"  (1.702 m)   Wt (!) 306 lb 12.8 oz (139.2 kg)   SpO2 95%   BMI 48.05 kg/m   Body mass index is 48.05 kg/m.  GENERAL: vitals reviewed and listed above, alert, oriented, appears well hydrated and in pain  limping   but   weight bearing okindependent  HEENT: atraumatic, conjunctiva  clear, no obvious abnormalities on inspection of external nose and ears   NMS: moves all extremities r knee  no point tenderness  mild effusion  feels pain is internal and posterior anterior  no obv neuro problem and no rash  PSYCH: uncomfortable o obvious depression or anxiety Lab Results  Component Value Date   WBC 4.9 08/29/2020   HGB 14.1  08/29/2020   HCT 42.6 08/29/2020   PLT 303.0 08/29/2020   GLUCOSE 101 (H) 08/29/2020   CHOL 132 08/29/2020   TRIG 105.0 08/29/2020   HDL 37.60 (L) 08/29/2020   LDLCALC 73 08/29/2020   ALT 22 08/29/2020   AST 19 08/29/2020   NA 138 08/29/2020   K 4.3 08/29/2020   CL 98 08/29/2020   CREATININE 0.85 08/29/2020   BUN 13 08/29/2020   CO2 30 08/29/2020   TSH 1.14 08/29/2020   HGBA1C 7.1 (A) 05/31/2022   MICROALBUR <0.7 08/29/2020   BP Readings from Last 3 Encounters:  05/31/22 (!) 134/90  05/22/22 (!) 126/90  10/20/21 126/78    ASSESSMENT AND PLAN:  Discussed the following assessment and plan:  Acute pain of right knee - weeks unclear etiology but pain is severe for her ( x ray ned) neg [name] not typical of sciatica - Plan: Ambulatory referral to Sports Medicine  Type 2 diabetes mellitus with hyperglycemia, without long-term current use of insulin (HCC) - overdue for a1c  check today and fu - Plan: POC HgB A1c  Essential hypertension  BMI 40.0-44.9, adult (HCC)  Medication side effect - ozempic abd sx will try mounjaro  couldnt take metformin  Medication management - refillcrestor due for labs pre visit in summer Many factors need fu   will address at upcoming cpe in summer  labs to be ordered  -Patient advised to return or notify health care team  if  new concerns arise.  Patient Instructions  I think the knee is the culprit  for such local and severe pain  . Not sure why ? Internal derange medt? Other  and  prob not the sciatica . Will do SM referral for now asap.   Trial mounjaro  Lab before visit .   [name]. Roizy Harold M.D.

## 2022-05-31 NOTE — Patient Instructions (Addendum)
I think the knee is the culprit  for such local and severe pain  . Not sure why ? Internal derange medt? Other  and  prob not the sciatica . Will do SM referral for now asap.   Trial mounjaro  Lab before visit .

## 2022-05-31 NOTE — Progress Notes (Signed)
Failed gabapentin, tried husbands  mtizandidine from [facility]  and nsaids not helpful She is requesting anyuthing for pain acutely Rx tramadol 15 short term until eval .

## 2022-06-05 ENCOUNTER — Ambulatory Visit (INDEPENDENT_AMBULATORY_CARE_PROVIDER_SITE_OTHER): Payer: BC Managed Care – PPO | Admitting: Family Medicine

## 2022-06-05 ENCOUNTER — Encounter: Payer: Self-pay | Admitting: Family Medicine

## 2022-06-05 ENCOUNTER — Other Ambulatory Visit: Payer: Self-pay

## 2022-06-05 VITALS — BP 130/88 | HR 78 | Ht 67.0 in | Wt 313.0 lb

## 2022-06-05 DIAGNOSIS — M25561 Pain in right knee: Secondary | ICD-10-CM

## 2022-06-05 NOTE — Patient Instructions (Addendum)
Thank you for coming in today.   You received an injection today. Seek immediate medical attention if the joint becomes red, extremely painful, or is oozing fluid.   I've added your R knee to the physical therapy order  I recommend you obtained a compression sleeve to help with your joint problems. There are many options on the market however I recommend obtaining a Full Knee Body Helix compression sleeve.  You can find information (including how to appropriate measure yourself for sizing) can be found at www.Body [employer].ch.  Many of these products are health savings account (HSA) eligible.  You can use the compression sleeve at any time throughout the day but is most important to use while being active as well as for 2 hours post-activity.   It is appropriate to ice following activity with the compression sleeve in place.   Please use Voltaren gel (Generic Diclofenac Gel) up to 4x daily for pain as needed.  This is available over-the-counter as both the name brand Voltaren gel and the generic diclofenac gel.   Check back in 6 weeks

## 2022-06-05 NOTE — Progress Notes (Signed)
I, Stevenson Clinch, CMA acting as a scribe for Clementeen Graham, MD.  Beth Wallace is a 57 y.o. female who presents to [facility] at [facility] today for R knee pain ongoing since around the end of April. No MOI. Pt locates pain to medial, lateral, and posterior aspects of the knee. Sx worse with knee flexion. Pain radiates into the calf. Visible swelling. Occasional popping, feels unstable. Had eval at [facility], was told that the knee looked good overall, had concerns for DVT (recent air travel), ordered doppler, came back negative. Hx of lumbar radiculopathy/sciatica.  She did have x-rays at [facility] and was told she has mild arthritis.  She did not have much treatment.  Dx testing: 05/29/22 R LE vasc US  Pertinent review of systems: No fevers or chills  Relevant historical information: Hypertension and diabetes   Exam:  BP 130/88   Pulse 78   Ht 5\' 7"  (1.702 m)   Wt (!) 313 lb (142 kg)   SpO2 98%   BMI 49.02 kg/m  General: Well Developed, well nourished, and in no acute distress.   MSK: Right knee moderate effusion otherwise normal. Normal knee motion with mild crepitation. Tender palpation medial joint line. Mild laxity MCL stress test otherwise intact ligamentous exam testing. Intact strength.   Lab and Radiology Results  Procedure: Real-time Ultrasound Guided Injection of right knee joint superior lateral patellar space Device: Philips Affiniti 50G Images permanently stored and available for review in PACS Ultrasound evaluation prior to injection reveals moderate joint effusion and small Baker's cyst.  Mild narrowing medial joint line visible. Verbal informed consent obtained.  Discussed risks and benefits of procedure. Warned about infection, bleeding, hyperglycemia damage to structures among others. Patient expresses understanding and agreement Time-out conducted.   Noted no overlying erythema, induration, or other signs of local  infection.   Skin prepped in a sterile fashion.   Local anesthesia: Topical Ethyl chloride.   With sterile technique and under real time ultrasound guidance: 40 mg of Kenalog and 2 ml Marcaine injected into knee joint. Fluid seen entering the joint capsule.   Completed without difficulty   Pain immediately resolved suggesting accurate placement of the medication.   Advised to call if fevers/chills, erythema, induration, drainage, or persistent bleeding.   Images permanently stored and available for review in the ultrasound unit.  Impression: Technically successful ultrasound guided injection.        Assessment and Plan: 57 y.o. female with right knee pain thought to be due to exacerbation of DJD.  She may have a degenerative meniscus tear as well.  Will obtain records of her recent visit to [facility] so we can get a sense from her x-ray report how much arthritis she has.  Proceed with steroid injection today and physical therapy referral.  If not improving could consider aspiration and injection of Baker's cyst. Recommend Voltaren gel and body helix compression sleeve. Recheck in 6 weeks or so.  PDMP not reviewed this encounter. Orders Placed This Encounter  Procedures   [name] LIMITED JOINT SPACE STRUCTURES LOW RIGHT(NO LINKED CHARGES)    Order Specific Question:   Reason for Exam (SYMPTOM  OR DIAGNOSIS REQUIRED)    Answer:   right knee pain and effusion    Order Specific Question:   Preferred imaging location?    Answer:   [facility] referral to Physical Therapy    Referral Priority:   Routine    Referral Type:  Physical Medicine    Referral Reason:   Specialty Services Required    Requested Specialty:   Physical Therapy    Number of Visits Requested:   1   No orders of the defined types were placed in this encounter.    Discussed warning signs or symptoms. Please see discharge instructions. Patient expresses  understanding.   The above documentation has been reviewed and is accurate and complete Clementeen Graham, M.D.

## 2022-06-07 ENCOUNTER — Other Ambulatory Visit (HOSPITAL_COMMUNITY): Payer: Self-pay

## 2022-06-07 ENCOUNTER — Telehealth: Payer: Self-pay

## 2022-06-07 NOTE — Telephone Encounter (Signed)
Patient Advocate Encounter   Received notification from [facility] that prior authorization for Beth Wallace is required.   PA submitted on 06/07/2022 [facility] Insurance [employer] Status is pending    Waiting on clinical questions to populate.

## 2022-06-08 NOTE — Telephone Encounter (Signed)
Submitted clinical questions.

## 2022-06-08 NOTE — Telephone Encounter (Signed)
Pharmacy Patient Advocate Encounter  Prior Authorization for Greggory Keen has been approved by [facility] [employer] (ins).    PA # [id number] Effective dates: 06/08/2022 through 05/18/2025

## 2022-07-11 ENCOUNTER — Other Ambulatory Visit: Payer: Self-pay | Admitting: Internal Medicine

## 2022-07-11 ENCOUNTER — Encounter: Payer: BC Managed Care – PPO | Admitting: Internal Medicine

## 2022-07-13 ENCOUNTER — Ambulatory Visit: Payer: BC Managed Care – PPO | Admitting: Allergy & Immunology

## 2022-07-16 NOTE — Progress Notes (Unsigned)
   Rubin Payor, PhD, LAT, ATC acting as a scribe for Clementeen Graham, MD.  Haskell Riling Miller-Womack is a 57 y.o. female who presents to [facility] at [facility] today for f/u R knee pain. Pt was last seen by Dr. Denyse Amass on 06/05/22 and was given a R knee steroid injection, and she signed a medical release form to obtain records from [place]. She was also advised to use a Body Helix Compression Sleeve, Voltaren gel, and was referred to [facility] in [place].   Today, pt reports ***  Dx testing: 05/29/22 R LE vasc US   Pertinent review of systems: ***  Relevant historical information: ***   Exam:  There were no vitals taken for this visit. General: Well Developed, well nourished, and in no acute distress.   MSK: ***    Lab and Radiology Results No results found for this or any previous visit (from the past 72 hour(s)). No results found.     Assessment and Plan: 57 y.o. female with ***   PDMP not reviewed this encounter. No orders of the defined types were placed in this encounter.  No orders of the defined types were placed in this encounter.    Discussed warning signs or symptoms. Please see discharge instructions. Patient expresses understanding.   ***

## 2022-07-17 ENCOUNTER — Ambulatory Visit (INDEPENDENT_AMBULATORY_CARE_PROVIDER_SITE_OTHER): Payer: BC Managed Care – PPO | Admitting: Family Medicine

## 2022-07-17 ENCOUNTER — Encounter: Payer: Self-pay | Admitting: Family Medicine

## 2022-07-17 VITALS — BP 120/82 | HR 72 | Ht 67.0 in | Wt 294.8 lb

## 2022-07-17 DIAGNOSIS — M25561 Pain in right knee: Secondary | ICD-10-CM

## 2022-07-17 DIAGNOSIS — G8929 Other chronic pain: Secondary | ICD-10-CM

## 2022-07-17 NOTE — Patient Instructions (Signed)
Thank you for coming in today.   We can do more if needed.   Continue exercises   I can repeat that injection on or after Aug 21st. Let me know if this will not last.

## 2022-07-28 ENCOUNTER — Other Ambulatory Visit: Payer: Self-pay | Admitting: Internal Medicine

## 2022-08-07 NOTE — Progress Notes (Unsigned)
No chief complaint on file.   HPI: Patient  Beth Wallace  57 y.o. comes in today for Preventive Health Care visit   Health Maintenance  Topic Date Due   FOOT EXAM  Never done   OPHTHALMOLOGY EXAM  Never done   HIV Screening  Never done   Hepatitis C Screening  Never done   Zoster Vaccines- Shingrix (1 of 2) Never done   Diabetic kidney evaluation - eGFR measurement  08/29/2021   Diabetic kidney evaluation - Urine ACR  08/29/2021   COVID-19 Vaccine (4 - 2023-24 season) 09/15/2021   MAMMOGRAM  01/15/2022   INFLUENZA VACCINE  08/16/2022   HEMOGLOBIN A1C  12/01/2022   PAP SMEAR-Modifier  01/16/2023   Colonoscopy  02/25/2028   DTaP/Tdap/Td (3 - Tdap) 08/30/2030   HPV VACCINES  Aged Out   Health Maintenance Review LIFESTYLE:  Exercise:   Tobacco/ETS: Alcohol:  Sugar beverages: Sleep: Drug use: no HH of  Work:    ROS:  GEN/ HEENT: No fever, significant weight changes sweats headaches vision problems hearing changes, CV/ PULM; No chest pain shortness of breath cough, syncope,edema  change in exercise tolerance. GI /GU: No adominal pain, vomiting, change in bowel habits. No blood in the stool. No significant GU symptoms. SKIN/HEME: ,no acute skin rashes suspicious lesions or bleeding. No lymphadenopathy, nodules, masses.  NEURO/ PSYCH:  No neurologic signs such as weakness numbness. No depression anxiety. IMM/ Allergy: No unusual infections.  Allergy .   REST of 12 system review negative except as per HPI   Past Medical History:  Diagnosis Date   ADJ DISORDER WITH MIXED ANXIETY & DEPRESSED MOOD 12/14/2009   Qualifier: Diagnosis of  By: Beth Sharp MD, Beth Wallace    Allergic reaction 05/18/2011   Allergy    SEASONAL    Anxiety    Diabetes mellitus without complication (HCC)    pre diabetic- not started Metformin - diet and exercise to control    GERD (gastroesophageal reflux disease)    Heart murmur    slight years ago    History of gallstones    HTN  (hypertension)    Metrorrhagia 12/14/2009   Qualifier: Diagnosis of  By: Beth Sharp MD, Beth Wallace With pelvic pain  Sees Dr Beth Wallace and tobotic hysterectomy scheduled    Neuromuscular disorder (HCC)    cervical issues to arms   Obesity    Rapid palpitations 01/23/2012   Probably related to stress had negative cardiac workup in the past rule out metabolic    TMJ (temporomandibular joint disorder)     Past Surgical History:  Procedure Laterality Date   ABDOMINAL HYSTERECTOMY  09/2010   partial   CESAREAN SECTION     CHOLECYSTECTOMY  1989   UPPER GASTROINTESTINAL ENDOSCOPY     WISDOM TOOTH EXTRACTION  2010    Family History  Problem Relation Age of Onset   Heart disease Mother 3       Died age 45, MI   Heart attack Mother    Hypertension Mother    Hypertension Father        Living   Diabetes Father    Heart disease Father    Other Sister        Killed, age 33   Other Daughter        Killed, age 46   Colon polyps Maternal Aunt    Hyperlipidemia Neg Hx    Sudden death Neg Hx    Colon cancer Neg Hx    Esophageal cancer  Neg Hx    Rectal cancer Neg Hx    Stomach cancer Neg Hx     Social History   Socioeconomic History   Marital status: Married    Spouse name: Not on file   Number of children: 1   Years of education: 16   Highest education level: Not on file  Occupational History   Occupation: Event organiser: [employer]  Tobacco Use   Smoking status: Some Days    Current packs/day: 0.25    Types: Cigarettes   Smokeless tobacco: Never   Tobacco comments:    Pt.smokes some days   Vaping Use   Vaping status: Never Used  Substance and Sexual Activity   Alcohol use: No    Alcohol/week: 0.0 standard drinks of alcohol   Drug use: No   Sexual activity: Not on file  Other Topics Concern   Not on file  Social History Narrative   Orig from [place] and changes for job,     7-8 hours sleep per night     Born in [place], [place],     [employer],     Geographical information systems officer,    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  On line now.,       Back in school gen ed   At [employer],      [facility] of 2,     BF and dog,     G3P1,     Bereaved parent daughter murdered 2008,   Trial finished  Beth Wallace guilty this week march    No etoh,      No caffeine,     Minimal tobacco,       ETS     Got married feb 14    Right Handed   Social Determinants of Health   Financial Resource Strain: Patient Declined (05/31/2022)   Overall Financial Resource Strain (CARDIA)    Difficulty of Paying Living Expenses: Patient declined  Food Insecurity: Patient Declined (05/31/2022)   Hunger Vital Sign    Worried About Running Out of Food in the Last Year: Patient declined    Ran Out of Food in the Last Year: Patient declined  Transportation Needs: No Transportation Needs (05/31/2022)   PRAPARE - Administrator, Civil Service (Medical): No    Lack of Transportation (Non-Medical): No  Physical Activity: Unknown (05/31/2022)   Exercise Vital Sign    Days of Exercise per Week: 0 days    Minutes of Exercise per Session: Not on file  Stress: Stress Concern Present (05/31/2022)   [employer] of Occupational Health - Occupational Stress Questionnaire    Feeling of Stress : To some extent  Social Connections: Unknown (05/31/2022)   Social Connection and Isolation Panel [NHANES]    Frequency of Communication with Friends and Family: Three times a week    Frequency of Social Gatherings with Friends and Family: Once a week    Attends Religious Services: Patient declined    Database administrator or Organizations: No    Attends Engineer, structural: Not on file    Marital Status: Patient declined    Outpatient Medications Prior to Visit  Medication Sig Dispense Refill   ALPRAZolam (XANAX) 0.5 MG tablet TAKE 1 TABLET BY MOUTH THREE TIMES A DAY AS NEEDED FOR ANXIETY 24 tablet 0   azelastine (ASTELIN) 0.1 % nasal spray 1 2 PUFF IN EACH NOSTRIL TWICE A DAY AS NEEDED  NASALLY 30  DAY(S)     Azelastine-Fluticasone 137-50 MCG/ACT SUSP      cetirizine (ZYRTEC) 10 MG tablet Take 10 mg by mouth at bedtime.     chlorhexidine (PERIDEX) 0.12 % solution 15 mLs 2 (two) times daily.     cyclobenzaprine (FLEXERIL) 5 MG tablet Take 1 tablet (5 mg total) by mouth at bedtime as needed for muscle spasms (neck pain (severe)). 30 tablet 5   EPINEPHrine (AUVI-Q) 0.3 mg/0.3 mL IJ SOAJ injection See admin instructions.     fexofenadine (ALLEGRA) 180 MG tablet Take 180 mg by mouth as needed.     fluticasone (FLONASE) 50 MCG/ACT nasal spray Place 2 sprays into both nostrils daily.     hydrOXYzine (ATARAX/VISTARIL) 10 MG tablet Take 10 mg by mouth 3 (three) times daily as needed for itching.     ibuprofen (ADVIL,MOTRIN) 800 MG tablet TAKE 1 TABLET BY MOUTH EVERY 8 HOURS AS NEEDED FOR CRAMPS 30 tablet 2   lisinopril-hydrochlorothiazide (ZESTORETIC) 20-25 MG tablet TAKE 1 TABLET BY MOUTH EVERY DAY 90 tablet 3   metFORMIN (GLUCOPHAGE) 500 MG tablet TAKE 1 TABLET (500 MG TOTAL) BY MOUTH DAILY WITH BREAKFAST. INCREASE TO TWICE A DAY AFTER 3 WEEKS OR AS DIRECTED 180 tablet 1   naproxen (NAPROSYN) 500 MG tablet TAKE 1 TABLET BY MOUTH EVERY 12 (TWELVE) HOURS AS NEEDED FOR MILD PAIN (NECK PAIN). 60 tablet 1   omeprazole (PRILOSEC) 20 MG capsule TAKE 1 CAPSULE (20 MG TOTAL) BY MOUTH 2 (TWO) TIMES DAILY BEFORE A MEAL. AS DIRECTED 180 capsule 1   phentermine 30 MG capsule TAKE 1 CAPSULE BY MOUTH EVERY DAY IN THE MORNING 30 capsule 0   rosuvastatin (CRESTOR) 10 MG tablet Take 1 tablet (10 mg total) by mouth daily. 90 tablet 0   tirzepatide (MOUNJARO) 2.5 MG/0.5ML Pen Inject 2.5 mg into the skin once a week. 2 mL 1   tiZANidine (ZANAFLEX) 2 MG tablet TAKE 1 TABLET BY MOUTH AT BEDTIME AS NEEDED FOR MUSCLE SPASMS. 90 tablet 2   No facility-administered medications prior to visit.     EXAM:  There were no vitals taken for this visit.  There is no height or weight on file to calculate BMI. Wt  Readings from Last 3 Encounters:  07/17/22 294 lb 12.8 oz (133.7 kg)  06/05/22 (!) 313 lb (142 kg)  05/31/22 (!) 306 lb 12.8 oz (139.2 kg)    Physical Exam: Vital signs reviewed [id number] is a well-developed well-nourished alert cooperative    who appearsr stated age in no acute distress.  HEENT: normocephalic atraumatic , Eyes: PERRL EOM's full, conjunctiva clear, Nares: paten,t no deformity discharge or tenderness., Ears: no deformity EAC's clear TMs with normal landmarks. Mouth: clear OP, no lesions, edema.  Moist mucous membranes. Dentition in adequate repair. NECK: supple without masses, thyromegaly or bruits. CHEST/PULM:  Clear to auscultation and percussion breath sounds equal no wheeze , rales or rhonchi. No chest wall deformities or tenderness. Breast: normal by inspection . No dimpling, discharge, masses, tenderness or discharge . CV: PMI is nondisplaced, S1 S2 no gallops, murmurs, rubs. Peripheral pulses are full without delay.No JVD .  ABDOMEN: Bowel sounds normal nontender  No guard or rebound, no hepato splenomegal no CVA tenderness.  No hernia. Extremtities:  No clubbing cyanosis or edema, no acute joint swelling or redness no focal atrophy NEURO:  Oriented x3, cranial nerves 3-12 appear to be intact, no obvious focal weakness,gait within normal limits no abnormal reflexes or asymmetrical SKIN: No acute rashes normal  turgor, color, no bruising or petechiae. PSYCH: Oriented, good eye contact, no obvious depression anxiety, cognition and judgment appear normal. LN: no cervical axillary inguinal adenopathy  Lab Results  Component Value Date   WBC 4.9 08/29/2020   HGB 14.1 08/29/2020   HCT 42.6 08/29/2020   PLT 303.0 08/29/2020   GLUCOSE 101 (H) 08/29/2020   CHOL 132 08/29/2020   TRIG 105.0 08/29/2020   HDL 37.60 (L) 08/29/2020   LDLCALC 73 08/29/2020   ALT 22 08/29/2020   AST 19 08/29/2020   NA 138 08/29/2020   K 4.3 08/29/2020   CL 98 08/29/2020   CREATININE 0.85  08/29/2020   BUN 13 08/29/2020   CO2 30 08/29/2020   TSH 1.14 08/29/2020   HGBA1C 7.1 (A) 05/31/2022   MICROALBUR <0.7 08/29/2020    BP Readings from Last 3 Encounters:  07/17/22 120/82  06/05/22 130/88  05/31/22 (!) 134/90    Lab roverdue reviewed with patient   ASSESSMENT AND PLAN:  Discussed the following assessment and plan:    ICD-10-CM   1. Visit for preventive health examination  Z00.00     2. Type 2 diabetes mellitus with hyperglycemia, without long-term current use of insulin (HCC)  E11.65     3. Essential hypertension  I10     4. Medication management  Z79.899      No follow-ups on file.  Patient Care Team: Madalina Rosman, Beth Mends, MD as PCP - General Meryl Dare, MD as Attending Physician (Gastroenterology) Glendale Chard, DO as Consulting Physician (Neurology) There are no Patient Instructions on file for this visit.  Beth Wallace. Haliegh Khurana M.D.

## 2022-08-08 ENCOUNTER — Ambulatory Visit (INDEPENDENT_AMBULATORY_CARE_PROVIDER_SITE_OTHER): Payer: BC Managed Care – PPO | Admitting: Internal Medicine

## 2022-08-08 ENCOUNTER — Encounter: Payer: Self-pay | Admitting: Internal Medicine

## 2022-08-08 VITALS — BP 128/86 | HR 76 | Temp 98.2°F | Ht 67.5 in | Wt 286.4 lb

## 2022-08-08 DIAGNOSIS — Z23 Encounter for immunization: Secondary | ICD-10-CM | POA: Diagnosis not present

## 2022-08-08 DIAGNOSIS — Z79899 Other long term (current) drug therapy: Secondary | ICD-10-CM | POA: Diagnosis not present

## 2022-08-08 DIAGNOSIS — Z0000 Encounter for general adult medical examination without abnormal findings: Secondary | ICD-10-CM

## 2022-08-08 DIAGNOSIS — E1165 Type 2 diabetes mellitus with hyperglycemia: Secondary | ICD-10-CM

## 2022-08-08 DIAGNOSIS — I10 Essential (primary) hypertension: Secondary | ICD-10-CM

## 2022-08-08 DIAGNOSIS — Z6841 Body Mass Index (BMI) 40.0 and over, adult: Secondary | ICD-10-CM

## 2022-08-08 DIAGNOSIS — E785 Hyperlipidemia, unspecified: Secondary | ICD-10-CM | POA: Diagnosis not present

## 2022-08-08 LAB — CBC WITH DIFFERENTIAL/PLATELET
Basophils Absolute: 0 10*3/uL (ref 0.0–0.1)
Basophils Relative: 0.8 % (ref 0.0–3.0)
Eosinophils Absolute: 0 10*3/uL (ref 0.0–0.7)
Eosinophils Relative: 0.8 % (ref 0.0–5.0)
HCT: 43.3 % (ref 36.0–46.0)
Hemoglobin: 14.5 g/dL (ref 12.0–15.0)
Lymphocytes Relative: 40.3 % (ref 12.0–46.0)
Lymphs Abs: 2.2 10*3/uL (ref 0.7–4.0)
MCHC: 33.5 g/dL (ref 30.0–36.0)
MCV: 87.4 fl (ref 78.0–100.0)
Monocytes Absolute: 0.3 10*3/uL (ref 0.1–1.0)
Monocytes Relative: 5.5 % (ref 3.0–12.0)
Neutro Abs: 2.9 10*3/uL (ref 1.4–7.7)
Neutrophils Relative %: 52.6 % (ref 43.0–77.0)
Platelets: 338 10*3/uL (ref 150.0–400.0)
RBC: 4.95 Mil/uL (ref 3.87–5.11)
RDW: 14.6 % (ref 11.5–15.5)
WBC: 5.5 10*3/uL (ref 4.0–10.5)

## 2022-08-08 LAB — LIPID PANEL
Cholesterol: 132 mg/dL (ref 0–200)
HDL: 33.2 mg/dL — ABNORMAL LOW (ref >39.00)
LDL Cholesterol: 79 mg/dL (ref 0–99)
NonHDL: 98.43
Total CHOL/HDL Ratio: 4
Triglycerides: 98 mg/dL (ref 0.0–149.0)
VLDL: 19.6 mg/dL (ref 0.0–40.0)

## 2022-08-08 LAB — HEPATIC FUNCTION PANEL
ALT: 20 U/L (ref 0–35)
AST: 22 U/L (ref 0–37)
Albumin: 4.4 g/dL (ref 3.5–5.2)
Alkaline Phosphatase: 66 U/L (ref 39–117)
Bilirubin, Direct: 0.2 mg/dL (ref 0.0–0.3)
Total Bilirubin: 0.7 mg/dL (ref 0.2–1.2)
Total Protein: 7.9 g/dL (ref 6.0–8.3)

## 2022-08-08 LAB — BASIC METABOLIC PANEL
BUN: 11 mg/dL (ref 6–23)
CO2: 30 mEq/L (ref 19–32)
Calcium: 9.9 mg/dL (ref 8.4–10.5)
Chloride: 98 mEq/L (ref 96–112)
Creatinine, Ser: 0.76 mg/dL (ref 0.40–1.20)
GFR: 88.44 mL/min (ref >60.00)
Glucose, Bld: 100 mg/dL — ABNORMAL HIGH (ref 70–99)
Potassium: 3.3 mEq/L — ABNORMAL LOW (ref 3.5–5.1)
Sodium: 139 mEq/L (ref 135–145)

## 2022-08-08 LAB — MICROALBUMIN / CREATININE URINE RATIO
Creatinine,U: 139.2 mg/dL
Microalb Creat Ratio: 0.5 mg/g (ref 0.0–30.0)
Microalb, Ur: <0.7 mg/dL (ref 0.0–1.9)

## 2022-08-08 LAB — VITAMIN B12: Vitamin B-12: 459 pg/mL (ref 211–911)

## 2022-08-08 LAB — TSH: TSH: 0.74 u[IU]/mL (ref 0.35–5.50)

## 2022-08-08 MED ORDER — TIRZEPATIDE 5 MG/0.5ML ~~LOC~~ SOAJ: 5.0000 mg | SUBCUTANEOUS | 2 refills | Status: DC

## 2022-08-08 MED ORDER — ROSUVASTATIN CALCIUM 10 MG PO TABS
10.0000 mg | ORAL_TABLET | Freq: Every day | ORAL | 2 refills | Status: DC
Start: 2022-08-08 — End: 2023-03-18

## 2022-08-08 MED ORDER — METFORMIN HCL ER 500 MG PO TB24: 500.0000 mg | ORAL_TABLET | Freq: Every day | ORAL | 2 refills | Status: DC

## 2022-08-08 MED ORDER — IBUPROFEN 800 MG PO TABS
ORAL_TABLET | ORAL | 2 refills | Status: DC
Start: 2022-08-08 — End: 2023-04-10

## 2022-08-08 NOTE — Patient Instructions (Signed)
Inc mounjaro to 5 mg per week injection Take metfomrin 500  once a day  let us know if any significant side effects .  No tobacco... Move more.   Shongirx 1 today  Make 2-3 mos fu  appt and can get 2 Shingrix and fu A1c  Lab today .

## 2022-08-10 ENCOUNTER — Other Ambulatory Visit: Payer: Self-pay | Admitting: Internal Medicine

## 2022-08-13 NOTE — Progress Notes (Signed)
Blood results are in range except potassium slighlty low(poss from the diuretic but has been normal in past)   advise inc potassium rich foods  and  recheck potassium   level in  3-4 weeks . If not coming up we will have to add presription  potassium pill

## 2022-08-15 ENCOUNTER — Other Ambulatory Visit: Payer: Self-pay

## 2022-08-15 DIAGNOSIS — Z79899 Other long term (current) drug therapy: Secondary | ICD-10-CM

## 2022-08-22 ENCOUNTER — Encounter: Payer: Self-pay | Admitting: Internal Medicine

## 2022-08-22 ENCOUNTER — Ambulatory Visit (INDEPENDENT_AMBULATORY_CARE_PROVIDER_SITE_OTHER): Payer: BC Managed Care – PPO | Admitting: Internal Medicine

## 2022-08-22 ENCOUNTER — Other Ambulatory Visit: Payer: Self-pay

## 2022-08-22 VITALS — BP 136/88 | HR 83 | Temp 98.1°F | Resp 16 | Ht 67.72 in | Wt 286.4 lb

## 2022-08-22 DIAGNOSIS — J3089 Other allergic rhinitis: Secondary | ICD-10-CM | POA: Diagnosis not present

## 2022-08-22 DIAGNOSIS — J069 Acute upper respiratory infection, unspecified: Secondary | ICD-10-CM | POA: Diagnosis not present

## 2022-08-22 DIAGNOSIS — S1086XD Insect bite of other specified part of neck, subsequent encounter: Secondary | ICD-10-CM | POA: Diagnosis not present

## 2022-08-22 DIAGNOSIS — T6391XD Toxic effect of contact with unspecified venomous animal, accidental (unintentional), subsequent encounter: Secondary | ICD-10-CM | POA: Diagnosis not present

## 2022-08-22 DIAGNOSIS — W57XXXD Bitten or stung by nonvenomous insect and other nonvenomous arthropods, subsequent encounter: Secondary | ICD-10-CM

## 2022-08-22 DIAGNOSIS — J302 Other seasonal allergic rhinitis: Secondary | ICD-10-CM

## 2022-08-22 MED ORDER — AZELASTINE HCL 0.1 % NA SOLN: 1.0000 | Freq: Two times a day (BID) | NASAL | 5 refills | Status: AC | PRN

## 2022-08-22 MED ORDER — EPINEPHRINE 0.3 MG/0.3ML IJ SOAJ: 0.3000 mg | INTRAMUSCULAR | 1 refills | Status: DC | PRN

## 2022-08-22 MED ORDER — FLUTICASONE PROPIONATE 50 MCG/ACT NA SUSP: 2.0000 | Freq: Every day | NASAL | 5 refills | Status: AC

## 2022-08-22 MED ORDER — HYDROCORTISONE 2.5 % EX CREA: TOPICAL_CREAM | CUTANEOUS | 0 refills | Status: DC

## 2022-08-22 MED ORDER — LEVOCETIRIZINE DIHYDROCHLORIDE 5 MG PO TABS: 5.0000 mg | ORAL_TABLET | Freq: Every evening | ORAL | 5 refills | Status: DC

## 2022-08-22 NOTE — Patient Instructions (Addendum)
Allergic Rhinitis Viral upper respiratory infection - SPT 10/2021: grasses, weeds, trees, dust mites, cat, and dog. - Avoidance measures discussed. - Use nasal saline rinses before nose sprays such as with Neilmed Sinus Rinse.  Use distilled water.   - Use Flonase 2 sprays each nostril daily. Aim upward and outward. - Use Azelastine 1-2 sprays each nostril twice daily as needed for runny nose, drainage, sneezing, congestion. Aim upward and outward. - Use Xyzal 5 mg daily.  - Consider allergy shots as long term control of your symptoms by teaching your immune system to be more tolerant of your allergy triggers  2. Insect sting allergy Concern For Stinging Insect Allergy: - based on today's history, will obtain labs for stinging insects - if negative, would consider confirming with skin testing - please carry Epinephrine autoinjector at all times in case of accidental sting, to be used if stung and has SKIN AND ANY OTHER SYMPTOMS - for SKIN only, can take Benadryl 2 tsp (25 mg)  - practice avoidance measures as outlined below when possible  3. Insect Bite  - For swelling/itching, apply hydrocortisone 2.5% twice daily as needed.  Maximum use 7 days.  - Use ice pack.   Keep for follow up with Dr. Dellis Anes as scheduled.

## 2022-08-22 NOTE — Progress Notes (Signed)
FOLLOW UP Date of Service/Encounter:  08/22/22   Subjective:  Beth Wallace (DOB: 26-Feb-1965) is a 57 y.o. female who [facility] for follow up for an acute visit.   History obtained from: chart review and patient. Last visit was with Dr. Dellis Anes 10/20/2021 and at the time was started on Flonase, Azelastine, Xyzal for allergies.  Also concern for stinging insect allergy with plans to obtain records from previous office; never been on VIT and has an Epipen.  Reports recently being bit by a flying insect, no stings.  Denies seeing any bees, wasps, yellow jacket around.  Afterwards, had itching, tingling and swelling where she was bit near her neck.  The swelling is going down with icing.  She is wondering if this is a lymph node.   Reports prior hx of being stung by a bee in [place] about 14 years ago and had throat closing and diffuse pruritus.  Never tested for venom allergy. Has an Epipen. Also reports since Sunday, she thinks she has gotten sick.  Having sinus pressure, congestion, drainage, sore throat.  Did have sick contacts.  Taking Mucinex, Flonase daily.  Out of Azelastine.  No high fevers.   Past Medical History: Past Medical History:  Diagnosis Date   ADJ DISORDER WITH MIXED ANXIETY & DEPRESSED MOOD 12/14/2009   Qualifier: Diagnosis of  By: Fabian Sharp MD, [name]    Allergic reaction 05/18/2011   Allergy    SEASONAL    Anxiety    Diabetes mellitus without complication (HCC)    pre diabetic- not started Metformin - diet and exercise to control    GERD (gastroesophageal reflux disease)    Heart murmur    slight years ago    History of gallstones    HTN (hypertension)    Metrorrhagia 12/14/2009   Qualifier: Diagnosis of  By: Fabian Sharp MD, [name] With pelvic pain  Sees Dr Henderson Cloud and tobotic hysterectomy scheduled    Neuromuscular disorder (HCC)    cervical issues to arms   Obesity    Rapid palpitations 01/23/2012   Probably  related to stress had negative cardiac workup in the past rule out metabolic    TMJ (temporomandibular joint disorder)     Objective:  BP 136/88   Pulse 83   Temp 98.1 F (36.7 C)   Resp 16   Ht 5' 7.72" (1.72 m)   Wt 286 lb 6 oz (129.9 kg)   SpO2 96%   BMI 43.91 kg/m  Body mass index is 43.91 kg/m. Physical Exam: GEN: alert, well developed HEENT: clear conjunctiva, TM grey and translucent, nose with mild inferior turbinate hypertrophy, pink nasal mucosa, clear rhinorrhea, + cobblestoning, slight swelling of L upper neck HEART: regular rate and rhythm, no murmur LUNGS: clear to auscultation bilaterally, no coughing, unlabored respiration SKIN: no rashes or lesions  Assessment:   1. Seasonal and perennial allergic rhinitis   2. Venom-induced anaphylaxis, accidental or unintentional, subsequent encounter   3. Insect bite of other part of neck, subsequent encounter   4. Viral URI     Plan/Recommendations:  Allergic Rhinitis Viral URI - SPT 10/2021: grasses, weeds, trees, dust mites, cat, and dog. - Avoidance measures discussed. - Use nasal saline rinses before nose sprays such as with Neilmed Sinus Rinse.  Use distilled water.   - Use Flonase 2 sprays each nostril daily. Aim upward and outward. - Use Azelastine 1-2 sprays each nostril twice daily as needed for runny nose,  drainage, sneezing, congestion. Aim upward and outward. - Use Xyzal 5 mg daily.  - Consider allergy shots as long term control of your symptoms by teaching your immune system to be more tolerant of your allergy triggers - Discussed symptoms are likely related to viral URI, no need for antibiotics/prednisone at this time.  2. Insect sting allergy - based on today's history, will obtain labs for stinging insects- Bee sting resulting in diffuse pruritus and throat closing.  - please carry Epinephrine autoinjector at all times in case of accidental sting, to be used if stung and has SKIN AND ANY OTHER  SYMPTOMS - for SKIN only, can take Benadryl 2 tsp (25 mg)  - practice avoidance measures  3. Insect Bite  - For swelling/itching, apply hydrocortisone 2.5% twice daily as needed.  Maximum use 7 days.  - Use ice pack.  - If neck swelling persists, could be lymphadenopathy and would need follow up with PCP then.   Keep follow up in Sept.   Alesia Morin, MD [facility]

## 2022-08-29 ENCOUNTER — Ambulatory Visit (INDEPENDENT_AMBULATORY_CARE_PROVIDER_SITE_OTHER): Payer: BC Managed Care – PPO | Admitting: Internal Medicine

## 2022-08-29 ENCOUNTER — Encounter: Payer: Self-pay | Admitting: Internal Medicine

## 2022-08-29 VITALS — BP 122/72 | HR 85 | Temp 98.3°F | Ht 67.72 in | Wt 284.0 lb

## 2022-08-29 DIAGNOSIS — R221 Localized swelling, mass and lump, neck: Secondary | ICD-10-CM

## 2022-08-29 DIAGNOSIS — T7840XS Allergy, unspecified, sequela: Secondary | ICD-10-CM

## 2022-08-29 NOTE — Progress Notes (Signed)
Chief Complaint  Patient presents with   Follow-up    Pt reports she was seen with allergist for  insect bite on L side of neck. She was told she had reaction to it, had some viral infection. Given hydrocortisone cream. Swollen,Itching and burning on L side. Sx subsided. Still feel small bump on L side. No itching.    HPI: Beth Wallace 57 y.o. come in for check lump on neck    bit 2 weeks ago  itchy and  biting flies?attacked when watering  flowers      Itched all over  face .Took liquid benadryl.  In case  felt bad in head   went to sleep and then awoke with neck tight. And had  large bump l on left neck .  Inferior some tender .  Since then has decreased in size and no more tender unless palpates  worried it  could e significant  looked on line and got scared about cancer  Went to dr Allergy and rx with  sinus infection   didn't feel lump was significant but needs to have [name]  check bump.  And advice . ROS: See pertinent positives and negatives per HPI. No cp sob has quit tobacco  weight coming down feels good about this .   Increasing potassium in foods  and to have fu   Past Medical History:  Diagnosis Date   ADJ DISORDER WITH MIXED ANXIETY & DEPRESSED MOOD 12/14/2009   Qualifier: Diagnosis of  By: Fabian Sharp MD, [name]    Allergic reaction 05/18/2011   Allergy    SEASONAL    Anxiety    Diabetes mellitus without complication (HCC)    pre diabetic- not started Metformin - diet and exercise to control    GERD (gastroesophageal reflux disease)    Heart murmur    slight years ago    History of gallstones    HTN (hypertension)    Metrorrhagia 12/14/2009   Qualifier: Diagnosis of  By: Fabian Sharp MD, [name] With pelvic pain  Sees Dr Henderson Cloud and tobotic hysterectomy scheduled    Neuromuscular disorder (HCC)    cervical issues to arms   Obesity    Rapid palpitations 01/23/2012   Probably related to stress had negative cardiac workup in the past rule out metabolic    TMJ  (temporomandibular joint disorder)     Family History  Problem Relation Age of Onset   Heart disease Mother 66       Died age 23, MI   Heart attack Mother    Hypertension Mother    Hypertension Father        Living   Diabetes Father    Heart disease Father    Other Sister        Killed, age 59   Other Daughter        Killed, age 43   Colon polyps Maternal Aunt    Hyperlipidemia Neg Hx    Sudden death Neg Hx    Colon cancer Neg Hx    Esophageal cancer Neg Hx    Rectal cancer Neg Hx    Stomach cancer Neg Hx     Social History   Socioeconomic History   Marital status: Married    Spouse name: Not on file   Number of children: 1   Years of education: 16   Highest education level: Not on file  Occupational History   Occupation: Event organiser: [employer]  Tobacco Use  Smoking status: Some Days    Current packs/day: 0.25    Types: Cigarettes   Smokeless tobacco: Never   Tobacco comments:    Pt.smokes some days   Vaping Use   Vaping status: Never Used  Substance and Sexual Activity   Alcohol use: No    Alcohol/week: 0.0 standard drinks of alcohol   Drug use: No   Sexual activity: Not on file  Other Topics Concern   Not on file  Social History Narrative   Orig from [place] and changes for job,     7-8 hours sleep per night     Born in [place], [place],     [employer],     Geographical information systems officer,    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.,       Back in school gen ed   At [employer],      [facility],     BF and dog,     G3P1,     Bereaved parent daughter murdered 2008,   Trial finished  Claris Gower guilty this week march    No etoh,      No caffeine,     Minimal tobacco,       ETS     Got married feb 14    Right Handed   Social Determinants of Health   Financial Resource Strain: Patient Declined (05/31/2022)   Overall Financial Resource Strain (CARDIA)    Difficulty of Paying Living Expenses: Patient declined  Food Insecurity: Patient Declined  (05/31/2022)   Hunger Vital Sign    Worried About Running Out of Food in the Last Year: Patient declined    Ran Out of Food in the Last Year: Patient declined  Transportation Needs: No Transportation Needs (05/31/2022)   PRAPARE - Administrator, Civil Service (Medical): No    Lack of Transportation (Non-Medical): No  Physical Activity: Unknown (05/31/2022)   Exercise Vital Sign    Days of Exercise per Week: 0 days    Minutes of Exercise per Session: Not on file  Stress: Stress Concern Present (05/31/2022)   [employer] of Occupational Health - Occupational Stress Questionnaire    Feeling of Stress : To some extent  Social Connections: Unknown (05/31/2022)   Social Connection and Isolation Panel [NHANES]    Frequency of Communication with Friends and Family: Three times a week    Frequency of Social Gatherings with Friends and Family: Once a week    Attends Religious Services: Patient declined    Database administrator or Organizations: No    Attends Engineer, structural: Not on file    Marital Status: Patient declined    Outpatient Medications Prior to Visit  Medication Sig Dispense Refill   ALPRAZolam (XANAX) 0.5 MG tablet TAKE 1 TABLET BY MOUTH THREE TIMES A DAY AS NEEDED FOR ANXIETY 24 tablet 0   azelastine (ASTELIN) 0.1 % nasal spray Place 1 spray into both nostrils 2 (two) times daily as needed. Use in each nostril as directed 30 mL 5   Azelastine-Fluticasone 137-50 MCG/ACT SUSP      cetirizine (ZYRTEC) 10 MG tablet Take 10 mg by mouth as needed.     chlorhexidine (PERIDEX) 0.12 % solution 15 mLs as needed.     cyclobenzaprine (FLEXERIL) 5 MG tablet Take 1 tablet (5 mg total) by mouth at bedtime as needed for muscle spasms (neck pain (severe)). 30 tablet 5   EPINEPHrine (  AUVI-Q) 0.3 mg/0.3 mL IJ SOAJ injection Inject 0.3 mg into the muscle as needed for anaphylaxis. 2 each 1   fexofenadine (ALLEGRA) 180 MG tablet Take 180 mg by mouth as needed.      fluticasone (FLONASE) 50 MCG/ACT nasal spray Place 2 sprays into both nostrils daily. 16 g 5   hydrocortisone 2.5 % cream Apply twice daily for flare ups, maximum 7 days. 30 g 0   hydrOXYzine (ATARAX/VISTARIL) 10 MG tablet Take 10 mg by mouth 3 (three) times daily as needed for itching.     ibuprofen (ADVIL) 800 MG tablet TAKE 1 TABLET BY MOUTH EVERY 8 HOURS AS NEEDED FOR CRAMPS 30 tablet 2   levocetirizine (XYZAL) 5 MG tablet Take 1 tablet (5 mg total) by mouth every evening. 30 tablet 5   lisinopril-hydrochlorothiazide (ZESTORETIC) 20-25 MG tablet TAKE 1 TABLET BY MOUTH EVERY DAY 90 tablet 3   metFORMIN (GLUCOPHAGE-XR) 500 MG 24 hr tablet Take 1 tablet (500 mg total) by mouth daily with breakfast. 90 tablet 2   omeprazole (PRILOSEC) 20 MG capsule TAKE 1 CAPSULE (20 MG TOTAL) BY MOUTH 2 (TWO) TIMES DAILY BEFORE A MEAL. AS DIRECTED 180 capsule 1   phentermine 30 MG capsule TAKE 1 CAPSULE BY MOUTH EVERY DAY IN THE MORNING 30 capsule 0   rosuvastatin (CRESTOR) 10 MG tablet Take 1 tablet (10 mg total) by mouth daily. 90 tablet 2   tirzepatide (MOUNJARO) 2.5 MG/0.5ML Pen INJECT 2.5 MG SUBCUTANEOUSLY WEEKLY 2 mL 1   tirzepatide (MOUNJARO) 5 MG/0.5ML Pen Inject 5 mg into the skin once a week. Dosage change 6 mL 2   tiZANidine (ZANAFLEX) 2 MG tablet TAKE 1 TABLET BY MOUTH AT BEDTIME AS NEEDED FOR MUSCLE SPASMS. 90 tablet 2   No facility-administered medications prior to visit.     EXAM:  BP 122/72 (BP Location: Left Arm, Patient Position: Sitting, Cuff Size: Large)   Pulse 85   Temp 98.3 F (36.8 C) (Oral)   Ht 5' 7.72" (1.72 m)   Wt 284 lb (128.8 kg)   SpO2 95%   BMI 43.54 kg/m   Body mass index is 43.54 kg/m. Wt Readings from Last 3 Encounters:  08/29/22 284 lb (128.8 kg)  08/22/22 286 lb 6 oz (129.9 kg)  08/08/22 286 lb 6.4 oz (129.9 kg)    GENERAL: vitals reviewed and listed above, alert, oriented, appears well hydrated and in no acute distress HEENT: atraumatic, conjunctiva   clear, no obvious abnormalities on inspection of external nose and ears NECK: no obvious masses on inspection left inferior  mid neck is a pea sized nodule mobile and non tender  firm not hard  no other masses or nodules adenopathy   MS: moves all extremities without noticeable focal  abnormality PSYCH: pleasant and cooperative, cognition intact   BP Readings from Last 3 Encounters:  08/29/22 122/72  08/22/22 136/88  08/08/22 128/86    ASSESSMENT AND PLAN:  Discussed the following assessment and plan:  Lump in neck  Allergy, sequela - suggest proceed with plan of lab by allergist. Exam appears benign process  perhaps secondary reaction to sting bite inflammation .  No other "lumps" on ent exam suspect all reactive . Will follow reassurance  fu if larger or newer areas of concern Stopped tobacco cause of scare .Marland Kitchen... -Patient advised to return or notify health care team  if  new concerns arise.  There are no Patient Instructions on file for this visit.   [name]. Graclyn Lawther M.D.

## 2022-09-04 ENCOUNTER — Ambulatory Visit: Payer: BC Managed Care – PPO | Admitting: Neurology

## 2022-09-07 ENCOUNTER — Ambulatory Visit: Payer: BC Managed Care – PPO | Admitting: Neurology

## 2022-09-14 ENCOUNTER — Other Ambulatory Visit: Payer: BC Managed Care – PPO

## 2022-09-24 ENCOUNTER — Other Ambulatory Visit (INDEPENDENT_AMBULATORY_CARE_PROVIDER_SITE_OTHER): Payer: BC Managed Care – PPO

## 2022-09-24 DIAGNOSIS — I10 Essential (primary) hypertension: Secondary | ICD-10-CM | POA: Diagnosis not present

## 2022-09-24 DIAGNOSIS — E1165 Type 2 diabetes mellitus with hyperglycemia: Secondary | ICD-10-CM

## 2022-09-24 DIAGNOSIS — Z79899 Other long term (current) drug therapy: Secondary | ICD-10-CM

## 2022-09-24 DIAGNOSIS — Z0000 Encounter for general adult medical examination without abnormal findings: Secondary | ICD-10-CM

## 2022-09-24 DIAGNOSIS — E785 Hyperlipidemia, unspecified: Secondary | ICD-10-CM

## 2022-09-24 LAB — BASIC METABOLIC PANEL
BUN: 15 mg/dL (ref 6–23)
CO2: 35 meq/L — ABNORMAL HIGH (ref 19–32)
Calcium: 9.5 mg/dL (ref 8.4–10.5)
Chloride: 98 meq/L (ref 96–112)
Creatinine, Ser: 0.81 mg/dL (ref 0.40–1.20)
GFR: 81.86 mL/min (ref >60.00)
Glucose, Bld: 80 mg/dL (ref 70–99)
Potassium: 2.9 meq/L — ABNORMAL LOW (ref 3.5–5.1)
Sodium: 140 meq/L (ref 135–145)

## 2022-09-24 LAB — HEPATIC FUNCTION PANEL
ALT: 25 U/L (ref 0–35)
AST: 25 U/L (ref 0–37)
Albumin: 4.2 g/dL (ref 3.5–5.2)
Alkaline Phosphatase: 67 U/L (ref 39–117)
Bilirubin, Direct: 0.1 mg/dL (ref 0.0–0.3)
Total Bilirubin: 0.5 mg/dL (ref 0.2–1.2)
Total Protein: 7.7 g/dL (ref 6.0–8.3)

## 2022-09-24 LAB — CBC WITH DIFFERENTIAL/PLATELET
Basophils Absolute: 0 10*3/uL (ref 0.0–0.1)
Basophils Relative: 0.5 % (ref 0.0–3.0)
Eosinophils Absolute: 0.1 10*3/uL (ref 0.0–0.7)
Eosinophils Relative: 2.5 % (ref 0.0–5.0)
HCT: 40.8 % (ref 36.0–46.0)
Hemoglobin: 13.4 g/dL (ref 12.0–15.0)
Lymphocytes Relative: 35.1 % (ref 12.0–46.0)
Lymphs Abs: 2.1 10*3/uL (ref 0.7–4.0)
MCHC: 33 g/dL (ref 30.0–36.0)
MCV: 88.1 fl (ref 78.0–100.0)
Monocytes Absolute: 0.4 10*3/uL (ref 0.1–1.0)
Monocytes Relative: 6.2 % (ref 3.0–12.0)
Neutro Abs: 3.3 10*3/uL (ref 1.4–7.7)
Neutrophils Relative %: 55.7 % (ref 43.0–77.0)
Platelets: 340 10*3/uL (ref 150.0–400.0)
RBC: 4.63 Mil/uL (ref 3.87–5.11)
RDW: 14.7 % (ref 11.5–15.5)
WBC: 5.9 10*3/uL (ref 4.0–10.5)

## 2022-09-24 LAB — LIPID PANEL
Cholesterol: 122 mg/dL (ref 0–200)
HDL: 37.2 mg/dL — ABNORMAL LOW (ref >39.00)
LDL Cholesterol: 66 mg/dL (ref 0–99)
NonHDL: 84.45
Total CHOL/HDL Ratio: 3
Triglycerides: 90 mg/dL (ref 0.0–149.0)
VLDL: 18 mg/dL (ref 0.0–40.0)

## 2022-09-24 LAB — MICROALBUMIN / CREATININE URINE RATIO
Creatinine,U: 191.3 mg/dL
Microalb Creat Ratio: 0.6 mg/g (ref 0.0–30.0)
Microalb, Ur: 1.1 mg/dL (ref 0.0–1.9)

## 2022-09-24 LAB — TSH: TSH: 0.86 u[IU]/mL (ref 0.35–5.50)

## 2022-10-01 NOTE — Progress Notes (Signed)
Potassium is too low . Rest  of results are in range or better .  Please add potassium CL 20 meq  .. take 2 per day for  5 days then 1  po every day  disp 40     recheck bmp in  3 weeks  ( non fasting is ok) .  Keep fu visit in October

## 2022-10-04 ENCOUNTER — Telehealth: Payer: Self-pay | Admitting: Internal Medicine

## 2022-10-04 ENCOUNTER — Other Ambulatory Visit: Payer: Self-pay

## 2022-10-04 MED ORDER — POTASSIUM CHLORIDE CRYS ER 20 MEQ PO TBCR: 20.0000 meq | EXTENDED_RELEASE_TABLET | Freq: Two times a day (BID) | ORAL | 0 refills | Status: DC

## 2022-10-04 MED ORDER — POTASSIUM CHLORIDE CRYS ER 20 MEQ PO TBCR: EXTENDED_RELEASE_TABLET | ORAL | 0 refills | Status: DC

## 2022-10-04 NOTE — Telephone Encounter (Signed)
Pt called, returning CMA's call regarding labs. CMA was with a patient. Pt asked that CMA call back at her earliest convenience.

## 2022-10-05 ENCOUNTER — Ambulatory Visit: Payer: BC Managed Care – PPO | Admitting: Allergy & Immunology

## 2022-10-08 ENCOUNTER — Other Ambulatory Visit: Payer: Self-pay

## 2022-10-08 DIAGNOSIS — Z79899 Other long term (current) drug therapy: Secondary | ICD-10-CM

## 2022-10-17 ENCOUNTER — Ambulatory Visit (INDEPENDENT_AMBULATORY_CARE_PROVIDER_SITE_OTHER): Payer: BC Managed Care – PPO | Admitting: Allergy & Immunology

## 2022-10-17 ENCOUNTER — Other Ambulatory Visit: Payer: Self-pay

## 2022-10-17 ENCOUNTER — Encounter: Payer: Self-pay | Admitting: Allergy & Immunology

## 2022-10-17 VITALS — BP 126/82 | HR 77 | Temp 98.0°F | Resp 16 | Wt 268.1 lb

## 2022-10-17 DIAGNOSIS — J3089 Other allergic rhinitis: Secondary | ICD-10-CM

## 2022-10-17 DIAGNOSIS — J302 Other seasonal allergic rhinitis: Secondary | ICD-10-CM

## 2022-10-17 DIAGNOSIS — T6391XD Toxic effect of contact with unspecified venomous animal, accidental (unintentional), subsequent encounter: Secondary | ICD-10-CM | POA: Diagnosis not present

## 2022-10-17 MED ORDER — MELOXICAM 15 MG PO TABS: 15.0000 mg | ORAL_TABLET | Freq: Every day | ORAL | 1 refills | Status: AC

## 2022-10-17 MED ORDER — EPINEPHRINE 0.3 MG/0.3ML IJ SOAJ: 0.3000 mg | INTRAMUSCULAR | 2 refills | Status: AC | PRN

## 2022-10-17 MED ORDER — RYALTRIS 665-25 MCG/ACT NA SUSP: 2.0000 | Freq: Two times a day (BID) | NASAL | 5 refills | Status: AC | PRN

## 2022-10-17 MED ORDER — PREDNISONE 10 MG PO TABS: ORAL_TABLET | ORAL | 0 refills | Status: DC

## 2022-10-17 NOTE — Progress Notes (Signed)
FOLLOW UP  Date of Service/Encounter:  10/17/22   Assessment:   Seasonal and perennial allergic rhinitis (grasses, weeds, trees, dust mites, cat, and dog)   Insect sting allergy - getting blood work today (EpiPen updated)   Dry skin  Social stressors - recent death of husband in 10-02-2022   Plan/Recommendations:   1. Chronic rhinitis - Previous testing showed: grasses, weeds, trees, dust mites, cat, and dog. - Continue taking: Xyzal (levocetirizine) 5mg  tablet once daily  - Start taking: Ryaltris (olopatadine/mometasone) two sprays per nostril 1-2 times daily as needed - Sample of Ryaltris provided.  - You can use an extra dose of the antihistamine, if needed, for breakthrough symptoms.  - Consider nasal saline rinses 1-2 times daily to remove allergens from the nasal cavities as well as help with mucous clearance (this is especially helpful to do before the nasal sprays are given) - Strongly consider allergy shots as a means of long-term control. - Allergy shots "re-train" and "reset" the immune system to ignore environmental allergens and decrease the resulting immune response to those allergens (sneezing, itchy watery eyes, runny nose, nasal congestion, etc).    - Allergy shots improve symptoms in 75-85% of patients.   2. Insect sting allergy - Labs ordered today.  Audry Riles refilled today.  3. Dry skin - Continue with moisturizing as you are doing.  - Your skin looks great!   4. Return in about 6 months (around 04/17/2023).   Subjective:   Beth Wallace is a 57 y.o. female presenting today for follow up of  Chief Complaint  Patient presents with   Follow-up    Drainage that nothing seems to be helping.     Beth Wallace has a history of the following: Patient Active Problem List   Diagnosis Date Noted   Seasonal and perennial allergic rhinitis 10/22/2021   Insect sting allergy, current reaction, accidental or unintentional, subsequent  encounter 10/22/2021   Dry skin 10/22/2021   Type 2 diabetes mellitus with hyperglycemia (HCC) 07/25/2021   BMI 40.0-44.9, adult (HCC) 04/20/2016   Cervicogenic headache 04/15/2015   Vitamin D deficiency 11/19/2014   Essential hypertension 11/19/2014   Hyperglycemia 09/08/2013   Low HDL (under 40) 04/08/2013   Visit for preventive health examination 04/08/2013   Severe obesity (BMI >= 40) (HCC) 04/08/2013   Achilles tendinitis 10/08/2012   Back pain 07/08/2012   Sciatica neuralgia 07/08/2012   Stress 01/23/2012   H/O bee sting allergy 05/18/2011   GERD (gastroesophageal reflux disease) 04/12/2011   Dysphagia 04/12/2011   OBESITY 01/31/2010   Essential hypertension 12/14/2009   BACK PAIN WITH RADICULOPATHY 12/14/2009    History obtained from: chart review and patient.  Discussed the use of AI scribe software for clinical note transcription with the patient, who gave verbal consent to proceed.  Beth Wallace is a 57 y.o. female presenting for a follow up visit.  She was last seen in 09-17-24by Thurston Hole one of our nurse practitioners.  At that time, she was continued on Flonase as well as Astelin and Xyzal.  She was encouraged to keep clearing her EpiPen.  The patient has been dealing with significant personal stress, including the recent unexpected death of her husband due to diabetic ketoacidosis. She returned to work for distraction but is considering taking a leave of absence. She has a history of significant personal loss, including the tragic deaths of her daughter and sister in 2008. She moved here from [place] and is going to  continue to live here. She went to [facility] recently to see an Usher performance and this cemented the idea that she is going to remain here in [place].   She is considering moving but has significant ties to her current location. She reported a significant weight loss of 45 pounds, which she attributed to a combination of low appetite and the use of Mounjaro. She  also reported a recent drop in potassium levels, which she believes is due to the [facility], and is now taking supplements. She was having muscle spasms from this.   Allergic Rhinitis Symptom History: The patient reports nasal symptoms, including pressure, sneezing, and a postnasal drip causing a cough, particularly when lying down. She denied any fever. She has been using Azatine and Flonase nasal sprays inconsistently. She also reported a sensation of tightness in her ears.  She has not had a fever at all since the last visit. The patient also reported a history of rib soreness, which has been improving. She denies any kidney dysfunction. She knows that this rib pain is from all of the coughing. She has not been on prednisone or antibiotics. She is not great with using a nose spray, but she is open to this.   Stinging Insect Symptom History: The patient presents with concerns about a recent insect bite. She reported no immediate reaction to the bite, and the associated redness has since resolved. She denied any throat swelling or other systemic symptoms. The patient has a known severe reaction to bee stings, which causes throat closure, and she carries an Epipen for this. She has never gotten the lab work done, but she is fine with getting it done after we discussed it today.   Otherwise, there have been no changes to her past medical history, surgical history, family history, or social history.    Review of systems otherwise negative other than that mentioned in the HPI.    Objective:   Blood pressure 126/82, pulse 77, temperature 98 F (36.7 C), resp. rate 16, weight 268 lb 2 oz (121.6 kg), SpO2 96%. Body mass index is 41.11 kg/m.    Physical Exam Vitals reviewed.  Constitutional:      Appearance: She is well-developed.     Comments: Tearful.   HENT:     Head: Normocephalic and atraumatic.     Right Ear: Tympanic membrane, ear canal and external ear normal. No drainage, swelling or  tenderness. Tympanic membrane is not injected, scarred, erythematous, retracted or bulging.     Left Ear: Tympanic membrane, ear canal and external ear normal. No drainage, swelling or tenderness. Tympanic membrane is not injected, scarred, erythematous, retracted or bulging.     Nose: Mucosal edema and rhinorrhea present. No nasal deformity or septal deviation.     Right Turbinates: Enlarged, swollen and pale.     Left Turbinates: Enlarged, swollen and pale.     Right Sinus: No maxillary sinus tenderness or frontal sinus tenderness.     Left Sinus: No maxillary sinus tenderness or frontal sinus tenderness.     Mouth/Throat:     Mouth: Mucous membranes are not pale and not dry.     Pharynx: Uvula midline.  Eyes:     General:        Right eye: No discharge.        Left eye: No discharge.     Conjunctiva/sclera: Conjunctivae normal.     Right eye: Right conjunctiva is not injected. No chemosis.    Left eye: Left conjunctiva  is not injected. No chemosis.    Pupils: Pupils are equal, round, and reactive to light.  Cardiovascular:     Rate and Rhythm: Normal rate and regular rhythm.     Heart sounds: Normal heart sounds.  Pulmonary:     Effort: Pulmonary effort is normal. No tachypnea, accessory muscle usage or respiratory distress.     Breath sounds: Normal breath sounds. No wheezing, rhonchi or rales.     Comments: Moving air well in all lung fields. No increased work of breathing noted.  Chest:     Chest wall: No tenderness.  Abdominal:     Tenderness: There is no abdominal tenderness. There is no guarding or rebound.  Lymphadenopathy:     Head:     Right side of head: No submandibular, tonsillar or occipital adenopathy.     Left side of head: No submandibular, tonsillar or occipital adenopathy.     Cervical: No cervical adenopathy.  Skin:    General: Skin is warm.     Capillary Refill: Capillary refill takes less than 2 seconds.     Coloration: Skin is not pale.     Findings: No  abrasion, erythema, petechiae or rash. Rash is not papular, urticarial or vesicular.     Comments: No eczematous patches noted.   Neurological:     Mental Status: She is alert.  Psychiatric:        Behavior: Behavior is cooperative.      Diagnostic studies: labs sent instead       Malachi Bonds, MD  [facility]

## 2022-10-17 NOTE — Patient Instructions (Addendum)
1. Chronic rhinitis - Previous testing showed: grasses, weeds, trees, dust mites, cat, and dog. - Continue taking: Xyzal (levocetirizine) 5mg  tablet once daily  - Start taking: Ryaltris (olopatadine/mometasone) two sprays per nostril 1-2 times daily as needed - Sample of Ryaltris provided.  - You can use an extra dose of the antihistamine, if needed, for breakthrough symptoms.  - Consider nasal saline rinses 1-2 times daily to remove allergens from the nasal cavities as well as help with mucous clearance (this is especially helpful to do before the nasal sprays are given) - Strongly consider allergy shots as a means of long-term control. - Allergy shots "re-train" and "reset" the immune system to ignore environmental allergens and decrease the resulting immune response to those allergens (sneezing, itchy watery eyes, runny nose, nasal congestion, etc).    - Allergy shots improve symptoms in 75-85% of patients.   2. Insect sting allergy - Labs ordered today.  Audry Riles refilled today.  3. Dry skin - Continue with moisturizing as you are doing.  - Your skin looks great!   4. Return in about 6 months (around 04/17/2023).    Please inform us of any Emergency Department visits, hospitalizations, or changes in symptoms. Call us before going to the ED for breathing or allergy symptoms since we might be able to fit you in for a sick visit. Feel free to contact us anytime with any questions, problems, or concerns.  It was a pleasure to see you today!   Websites that have reliable patient information: 1. American Academy of Asthma, Allergy, and Immunology: www.aaaai.org 2. Food Allergy Research and Education (FARE): foodallergy.org 3. Mothers of Asthmatics: http://www.asthmacommunitynetwork.org 4. American College of Allergy, Asthma, and Immunology: www.acaai.org   COVID-19 Vaccine Information can be found at: PodExchange.nl For  questions related to vaccine distribution or appointments, please email [email or web address] or call [phone].   We realize that you might be concerned about having an allergic reaction to the COVID19 vaccines. To help with that concern, WE ARE OFFERING THE COVID19 VACCINES IN OUR OFFICE! Ask the front desk for dates!     "Like" [name] on Facebook and Instagram for our latest updates!      A healthy democracy works best when [employer] participate! Make sure you are registered to vote! If you have moved or changed any of your contact information, you will need to get this updated before voting!  In some cases, you MAY be able to register to vote online: [email or web address]

## 2022-10-29 ENCOUNTER — Other Ambulatory Visit: Payer: BC Managed Care – PPO

## 2022-10-29 NOTE — Telephone Encounter (Signed)
Called patient to inquire when and if she was  still interested in getting her work restrictions as her paperwork has been sitting since May of this year. Patient stated that she no longer was in need of the paperwork as her PCP did it for her.

## 2022-10-30 ENCOUNTER — Other Ambulatory Visit: Payer: BC Managed Care – PPO

## 2022-11-01 ENCOUNTER — Other Ambulatory Visit: Payer: BC Managed Care – PPO

## 2022-11-07 NOTE — Progress Notes (Unsigned)
No chief complaint on file.   HPI: Beth Wallace 57 y.o. come in for Chronic disease management   Cpe 7 24  ROS: See pertinent positives and negatives per HPI.  Past Medical History:  Diagnosis Date   ADJ DISORDER WITH MIXED ANXIETY & DEPRESSED MOOD 12/14/2009   Qualifier: Diagnosis of  By: Fabian Sharp MD, [name]    Allergic reaction 05/18/2011   Allergy    SEASONAL    Anxiety    Diabetes mellitus without complication (HCC)    pre diabetic- not started Metformin - diet and exercise to control    GERD (gastroesophageal reflux disease)    Heart murmur    slight years ago    History of gallstones    HTN (hypertension)    Metrorrhagia 12/14/2009   Qualifier: Diagnosis of  By: Fabian Sharp MD, [name] With pelvic pain  Sees Dr Henderson Cloud and tobotic hysterectomy scheduled    Neuromuscular disorder (HCC)    cervical issues to arms   Obesity    Rapid palpitations 01/23/2012   Probably related to stress had negative cardiac workup in the past rule out metabolic    TMJ (temporomandibular joint disorder)     Family History  Problem Relation Age of Onset   Heart disease Mother 75       Died age 49, MI   Heart attack Mother    Hypertension Mother    Hypertension Father        Living   Diabetes Father    Heart disease Father    Other Sister        Killed, age 64   Other Daughter        Killed, age 32   Colon polyps Maternal Aunt    Hyperlipidemia Neg Hx    Sudden death Neg Hx    Colon cancer Neg Hx    Esophageal cancer Neg Hx    Rectal cancer Neg Hx    Stomach cancer Neg Hx     Social History   Socioeconomic History   Marital status: Married    Spouse name: Not on file   Number of children: 1   Years of education: 16   Highest education level: Not on file  Occupational History   Occupation: Event organiser: [employer]  Tobacco Use   Smoking status: Some Days    Current packs/day: 0.25    Types: Cigarettes   Smokeless tobacco: Never   Tobacco comments:     Pt.smokes some days   Vaping Use   Vaping status: Never Used  Substance and Sexual Activity   Alcohol use: No    Alcohol/week: 0.0 standard drinks of alcohol   Drug use: No   Sexual activity: Not on file  Other Topics Concern   Not on file  Social History Narrative   Orig from [place] and changes for job,     7-8 hours sleep per night     Born in [place], [place],     AT and T,     Geographical information systems officer,    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.,       Back in school gen ed   At [employer],      [facility] of 2,     BF and dog,     G3P1,     Bereaved parent daughter murdered 2008,   Trial finished  Claris Gower guilty this week march    No etoh,  No caffeine,     Minimal tobacco,       ETS     Got married feb 14    Right Handed   Social Determinants of Health   Financial Resource Strain: Patient Declined (05/31/2022)   Overall Financial Resource Strain (CARDIA)    Difficulty of Paying Living Expenses: Patient declined  Food Insecurity: Patient Declined (05/31/2022)   Hunger Vital Sign    Worried About Running Out of Food in the Last Year: Patient declined    Ran Out of Food in the Last Year: Patient declined  Transportation Needs: No Transportation Needs (05/31/2022)   PRAPARE - Administrator, Civil Service (Medical): No    Lack of Transportation (Non-Medical): No  Physical Activity: Unknown (05/31/2022)   Exercise Vital Sign    Days of Exercise per Week: 0 days    Minutes of Exercise per Session: Not on file  Stress: Stress Concern Present (05/31/2022)   [employer] of Occupational Health - Occupational Stress Questionnaire    Feeling of Stress : To some extent  Social Connections: Unknown (05/31/2022)   Social Connection and Isolation Panel [NHANES]    Frequency of Communication with Friends and Family: Three times a week    Frequency of Social Gatherings with Friends and Family: Once a week    Attends Religious Services: Patient declined     Database administrator or Organizations: No    Attends Engineer, structural: Not on file    Marital Status: Patient declined    Outpatient Medications Prior to Visit  Medication Sig Dispense Refill   ALPRAZolam (XANAX) 0.5 MG tablet TAKE 1 TABLET BY MOUTH THREE TIMES A DAY AS NEEDED FOR ANXIETY 24 tablet 0   azelastine (ASTELIN) 0.1 % nasal spray Place 1 spray into both nostrils 2 (two) times daily as needed. Use in each nostril as directed 30 mL 5   Azelastine-Fluticasone 137-50 MCG/ACT SUSP      cefdinir (OMNICEF) 300 MG capsule Take 300 mg by mouth every 12 (twelve) hours.     cetirizine (ZYRTEC) 10 MG tablet Take 10 mg by mouth as needed.     chlorhexidine (PERIDEX) 0.12 % solution 15 mLs as needed.     cyclobenzaprine (FLEXERIL) 5 MG tablet Take 1 tablet (5 mg total) by mouth at bedtime as needed for muscle spasms (neck pain (severe)). 30 tablet 5   EPINEPHrine (AUVI-Q) 0.3 mg/0.3 mL IJ SOAJ injection Inject 0.3 mg into the muscle as needed for anaphylaxis. 2 each 2   fexofenadine (ALLEGRA) 180 MG tablet Take 180 mg by mouth as needed.     fluticasone (FLONASE) 50 MCG/ACT nasal spray Place 2 sprays into both nostrils daily. 16 g 5   hydrocortisone 2.5 % cream Apply twice daily for flare ups, maximum 7 days. (Patient not taking: Reported on 10/17/2022) 30 g 0   hydrOXYzine (ATARAX/VISTARIL) 10 MG tablet Take 10 mg by mouth 3 (three) times daily as needed for itching.     ibuprofen (ADVIL) 800 MG tablet TAKE 1 TABLET BY MOUTH EVERY 8 HOURS AS NEEDED FOR CRAMPS 30 tablet 2   levocetirizine (XYZAL) 5 MG tablet Take 1 tablet (5 mg total) by mouth every evening. 30 tablet 5   lisinopril-hydrochlorothiazide (ZESTORETIC) 20-25 MG tablet TAKE 1 TABLET BY MOUTH EVERY DAY 90 tablet 3   meloxicam (MOBIC) 15 MG tablet Take 1 tablet (15 mg total) by mouth daily. 30 tablet 1   metFORMIN (GLUCOPHAGE-XR) 500 MG 24 hr  tablet Take 1 tablet (500 mg total) by mouth daily with breakfast. 90 tablet  2   Olopatadine-Mometasone (RYALTRIS) 665-25 MCG/ACT SUSP Place 2 sprays into the nose 2 (two) times daily as needed. 29 g 5   omeprazole (PRILOSEC) 20 MG capsule TAKE 1 CAPSULE (20 MG TOTAL) BY MOUTH 2 (TWO) TIMES DAILY BEFORE A MEAL. AS DIRECTED 180 capsule 1   Omeprazole Magnesium (PRILOSEC PO)      phentermine 30 MG capsule TAKE 1 CAPSULE BY MOUTH EVERY DAY IN THE MORNING (Patient not taking: Reported on 10/17/2022) 30 capsule 0   potassium chloride SA (KLOR-CON M) 20 MEQ tablet take 2 per day for 5 days then 1 p.o every day. 40 tablet 0   predniSONE (DELTASONE) 10 MG tablet Take two tablets (20mg ) twice daily for three days, then one tablet (10mg ) twice daily for three days, then STOP. 18 tablet 0   rosuvastatin (CRESTOR) 10 MG tablet Take 1 tablet (10 mg total) by mouth daily. 90 tablet 2   tirzepatide (MOUNJARO) 2.5 MG/0.5ML Pen INJECT 2.5 MG SUBCUTANEOUSLY WEEKLY (Patient not taking: Reported on 10/17/2022) 2 mL 1   tirzepatide (MOUNJARO) 5 MG/0.5ML Pen Inject 5 mg into the skin once a week. Dosage change 6 mL 2   tiZANidine (ZANAFLEX) 2 MG tablet TAKE 1 TABLET BY MOUTH AT BEDTIME AS NEEDED FOR MUSCLE SPASMS. 90 tablet 2   No facility-administered medications prior to visit.     EXAM:  There were no vitals taken for this visit.  There is no height or weight on file to calculate BMI.  GENERAL: vitals reviewed and listed above, alert, oriented, appears well hydrated and in no acute distress HEENT: atraumatic, conjunctiva  clear, no obvious abnormalities on inspection of external nose and ears OP : no lesion edema or exudate  NECK: no obvious masses on inspection palpation  LUNGS: clear to auscultation bilaterally, no wheezes, rales or rhonchi, good air movement CV: HRRR, no clubbing cyanosis or  peripheral edema nl cap refill  MS: moves all extremities without noticeable focal  abnormality PSYCH: pleasant and cooperative, no obvious depression or anxiety Diabetic Foot Exam - Simple    No data filed     Lab Results  Component Value Date   WBC 5.9 09/24/2022   HGB 13.4 09/24/2022   HCT 40.8 09/24/2022   PLT 340.0 09/24/2022   GLUCOSE 80 09/24/2022   CHOL 122 09/24/2022   TRIG 90.0 09/24/2022   HDL 37.20 (L) 09/24/2022   LDLCALC 66 09/24/2022   ALT 25 09/24/2022   AST 25 09/24/2022   NA 140 09/24/2022   K 2.9 (L) 09/24/2022   CL 98 09/24/2022   CREATININE 0.81 09/24/2022   BUN 15 09/24/2022   CO2 35 (H) 09/24/2022   TSH 0.86 09/24/2022   HGBA1C 7.1 (A) 05/31/2022   MICROALBUR 1.1 09/24/2022   BP Readings from Last 3 Encounters:  10/17/22 126/82  08/29/22 122/72  08/22/22 136/88    ASSESSMENT AND PLAN:  Discussed the following assessment and plan:  Medication management  Essential hypertension  Type 2 diabetes mellitus with hyperglycemia, without long-term current use of insulin (HCC)  Low serum potassium  -Patient advised to return or notify health care team  if  new concerns arise.  There are no Patient Instructions on file for this visit.   [name]. Camari Wisham M.D.

## 2022-11-08 ENCOUNTER — Ambulatory Visit (INDEPENDENT_AMBULATORY_CARE_PROVIDER_SITE_OTHER): Payer: BC Managed Care – PPO | Admitting: Internal Medicine

## 2022-11-08 ENCOUNTER — Encounter: Payer: Self-pay | Admitting: Internal Medicine

## 2022-11-08 VITALS — BP 142/88 | HR 78 | Temp 98.1°F | Ht 67.72 in | Wt 270.0 lb

## 2022-11-08 DIAGNOSIS — E876 Hypokalemia: Secondary | ICD-10-CM

## 2022-11-08 DIAGNOSIS — E1165 Type 2 diabetes mellitus with hyperglycemia: Secondary | ICD-10-CM | POA: Diagnosis not present

## 2022-11-08 DIAGNOSIS — Z79899 Other long term (current) drug therapy: Secondary | ICD-10-CM | POA: Diagnosis not present

## 2022-11-08 DIAGNOSIS — Z23 Encounter for immunization: Secondary | ICD-10-CM

## 2022-11-08 DIAGNOSIS — Z7985 Long-term (current) use of injectable non-insulin antidiabetic drugs: Secondary | ICD-10-CM

## 2022-11-08 DIAGNOSIS — I10 Essential (primary) hypertension: Secondary | ICD-10-CM | POA: Diagnosis not present

## 2022-11-08 LAB — BASIC METABOLIC PANEL
BUN: 13 mg/dL (ref 6–23)
CO2: 34 meq/L — ABNORMAL HIGH (ref 19–32)
Calcium: 9.2 mg/dL (ref 8.4–10.5)
Chloride: 97 meq/L (ref 96–112)
Creatinine, Ser: 0.84 mg/dL (ref 0.40–1.20)
GFR: 78.3 mL/min (ref >60.00)
Glucose, Bld: 98 mg/dL (ref 70–99)
Potassium: 3.6 meq/L (ref 3.5–5.1)
Sodium: 138 meq/L (ref 135–145)

## 2022-11-08 LAB — POCT GLYCOSYLATED HEMOGLOBIN (HGB A1C): Hemoglobin A1C: 6.3 % — AB (ref 4.0–5.6)

## 2022-11-08 LAB — MAGNESIUM: Magnesium: 1.8 mg/dL (ref 1.5–2.5)

## 2022-11-08 NOTE — Patient Instructions (Signed)
Stay on same mounjaro    muscle exercises   Potassium levels today . Bp monitoring .  Plan  fu about 3 mos .

## 2022-11-09 ENCOUNTER — Other Ambulatory Visit: Payer: Self-pay | Admitting: Internal Medicine

## 2022-11-11 NOTE — Progress Notes (Signed)
Potassium better ( low normal)  continue  potassium supplement  as current

## 2022-11-13 ENCOUNTER — Telehealth: Payer: Self-pay | Admitting: Allergy & Immunology

## 2022-11-13 NOTE — Telephone Encounter (Signed)
LVM for patient to give the office a call back to get details on what is going on and reasoning as to why she needs these medications sent into the pharmacy. Advised patient that she do not just sent these medications in for refills without a reason.

## 2022-11-13 NOTE — Telephone Encounter (Signed)
Patient called stating she needs a prescription for Prednisone and Cefdinir sent to [facility] in [place] on [address].

## 2022-11-14 NOTE — Telephone Encounter (Signed)
She needs a televisit for sure.   Malachi Bonds, MD [facility]

## 2022-11-14 NOTE — Telephone Encounter (Signed)
Spoke to patient and she informed me the prednisone never was sent into the pharmacy on 10/17/22 it was sent to [facility] pharmacy. Is it possible to resend prednisone into the correct pharmacy? [facility] [place], [place]. She still has the same nagging cough she had at her last appointment.  [place] [phone]

## 2022-11-17 MED ORDER — PREDNISONE 10 MG PO TABS: ORAL_TABLET | ORAL | 0 refills | Status: DC

## 2022-11-17 NOTE — Addendum Note (Signed)
Addended by: Alfonse Spruce on: 11/17/2022 07:02 AM   Modules accepted: Orders

## 2022-11-17 NOTE — Telephone Encounter (Signed)
[facility].

## 2022-11-30 ENCOUNTER — Ambulatory Visit: Payer: BC Managed Care – PPO | Admitting: Allergy & Immunology

## 2022-12-02 ENCOUNTER — Other Ambulatory Visit: Payer: Self-pay | Admitting: Family

## 2022-12-07 ENCOUNTER — Other Ambulatory Visit: Payer: Self-pay | Admitting: Allergy & Immunology

## 2022-12-12 ENCOUNTER — Other Ambulatory Visit: Payer: Self-pay | Admitting: Internal Medicine

## 2022-12-15 ENCOUNTER — Other Ambulatory Visit: Payer: Self-pay | Admitting: Family

## 2023-01-02 ENCOUNTER — Other Ambulatory Visit: Payer: Self-pay | Admitting: Internal Medicine

## 2023-01-03 MED ORDER — POTASSIUM CHLORIDE CRYS ER 20 MEQ PO TBCR: EXTENDED_RELEASE_TABLET | ORAL | 0 refills | Status: DC

## 2023-01-07 ENCOUNTER — Encounter: Payer: Self-pay | Admitting: Internal Medicine

## 2023-01-07 NOTE — Telephone Encounter (Signed)
 Care team updated and letter sent for eye exam notes.

## 2023-01-19 ENCOUNTER — Other Ambulatory Visit: Payer: Self-pay | Admitting: Neurology

## 2023-01-24 ENCOUNTER — Other Ambulatory Visit: Payer: Self-pay | Admitting: Internal Medicine

## 2023-02-12 ENCOUNTER — Ambulatory Visit (INDEPENDENT_AMBULATORY_CARE_PROVIDER_SITE_OTHER): Payer: BC Managed Care – PPO | Admitting: Internal Medicine

## 2023-02-12 ENCOUNTER — Encounter: Payer: Self-pay | Admitting: Internal Medicine

## 2023-02-12 VITALS — BP 110/74 | HR 79 | Temp 98.3°F | Ht 67.72 in | Wt 265.4 lb

## 2023-02-12 DIAGNOSIS — I10 Essential (primary) hypertension: Secondary | ICD-10-CM | POA: Diagnosis not present

## 2023-02-12 DIAGNOSIS — Z79899 Other long term (current) drug therapy: Secondary | ICD-10-CM | POA: Diagnosis not present

## 2023-02-12 DIAGNOSIS — E1165 Type 2 diabetes mellitus with hyperglycemia: Secondary | ICD-10-CM | POA: Diagnosis not present

## 2023-02-12 LAB — POCT GLYCOSYLATED HEMOGLOBIN (HGB A1C): Hemoglobin A1C: 6.2 % — AB (ref 4.0–5.6)

## 2023-02-12 NOTE — Progress Notes (Signed)
Chief Complaint  Patient presents with   Medical Management of Chronic Issues    3 months follow up and A1c    HPI: Beth Wallace 58 y.o. come in for Chronic disease management   Mlunjaro 5 mg per week and metformin 500 per day   doin gk no se of med losing some weight  Does go to gym swim walk   Still has some side pain lateral near ribs when sleeps someway  no problem in day sitting job or when goes to gym ? Could be from decrease weight  Job burn out noted but ok   Has vacation coming up .  Feels better on potassium supplement  ROS: See pertinent positives and negatives per HPI.  Past Medical History:  Diagnosis Date   ADJ DISORDER WITH MIXED ANXIETY & DEPRESSED MOOD 12/14/2009   Qualifier: Diagnosis of  By: Fabian Sharp MD, [name]    Allergic reaction 05/18/2011   Allergy    SEASONAL    Anxiety    Diabetes mellitus without complication (HCC)    pre diabetic- not started Metformin - diet and exercise to control    GERD (gastroesophageal reflux disease)    Heart murmur    slight years ago    History of gallstones    HTN (hypertension)    Metrorrhagia 12/14/2009   Qualifier: Diagnosis of  By: Fabian Sharp MD, [name] With pelvic pain  Sees Dr Henderson Cloud and tobotic hysterectomy scheduled    Neuromuscular disorder (HCC)    cervical issues to arms   Obesity    Rapid palpitations 01/23/2012   Probably related to stress had negative cardiac workup in the past rule out metabolic    TMJ (temporomandibular joint disorder)     Family History  Problem Relation Age of Onset   Heart disease Mother 76       Died age 64, MI   Heart attack Mother    Hypertension Mother    Hypertension Father        Living   Diabetes Father    Heart disease Father    Other Sister        Killed, age 3   Other Daughter        Killed, age 15   Colon polyps Maternal Aunt    Hyperlipidemia Neg Hx    Sudden death Neg Hx    Colon cancer Neg Hx    Esophageal cancer Neg Hx    Rectal cancer Neg Hx     Stomach cancer Neg Hx     Social History   Socioeconomic History   Marital status: Married    Spouse name: Not on file   Number of children: 1   Years of education: 16   Highest education level: Not on file  Occupational History   Occupation: Event organiser: [employer]  Tobacco Use   Smoking status: Some Days    Current packs/day: 0.25    Types: Cigarettes   Smokeless tobacco: Never   Tobacco comments:    Pt.smokes some days   Vaping Use   Vaping status: Never Used  Substance and Sexual Activity   Alcohol use: No    Alcohol/week: 0.0 standard drinks of alcohol   Drug use: No   Sexual activity: Not on file  Other Topics Concern   Not on file  Social History Narrative   Orig from [place] and changes for job,     7-8 hours sleep per night  Born in 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.,       Back in school gen ed   At [employer],      [facility] of 2,     BF and dog,     G3P1,     Bereaved parent daughter murdered 2008,   Trial finished  Claris Gower guilty this week march    No etoh,      No caffeine,     Minimal tobacco,       ETS     Got married feb 14    Right Handed   Social Drivers of Health   Financial Resource Strain: Patient Declined (05/31/2022)   Overall Financial Resource Strain (CARDIA)    Difficulty of Paying Living Expenses: Patient declined  Food Insecurity: Patient Declined (05/31/2022)   Hunger Vital Sign    Worried About Running Out of Food in the Last Year: Patient declined    Ran Out of Food in the Last Year: Patient declined  Transportation Needs: No Transportation Needs (05/31/2022)   PRAPARE - Administrator, Civil Service (Medical): No    Lack of Transportation (Non-Medical): No  Physical Activity: Unknown (05/31/2022)   Exercise Vital Sign    Days of Exercise per Week: 0 days    Minutes of Exercise per Session: Not on file  Stress: Stress Concern  Present (05/31/2022)   [employer] of Occupational Health - Occupational Stress Questionnaire    Feeling of Stress : To some extent  Social Connections: Unknown (05/31/2022)   Social Connection and Isolation Panel [NHANES]    Frequency of Communication with Friends and Family: Three times a week    Frequency of Social Gatherings with Friends and Family: Once a week    Attends Religious Services: Patient declined    Database administrator or Organizations: No    Attends Engineer, structural: Not on file    Marital Status: Patient declined    Outpatient Medications Prior to Visit  Medication Sig Dispense Refill   ALPRAZolam (XANAX) 0.5 MG tablet TAKE 1 TABLET BY MOUTH THREE TIMES A DAY AS NEEDED FOR ANXIETY 24 tablet 0   azelastine (ASTELIN) 0.1 % nasal spray Place 1 spray into both nostrils 2 (two) times daily as needed. Use in each nostril as directed 30 mL 5   Azelastine-Fluticasone 137-50 MCG/ACT SUSP      cetirizine (ZYRTEC) 10 MG tablet Take 10 mg by mouth as needed.     chlorhexidine (PERIDEX) 0.12 % solution 15 mLs as needed.     cyclobenzaprine (FLEXERIL) 5 MG tablet TAKE 1 TABLET (5 MG TOTAL) BY MOUTH AT BEDTIME AS NEEDED FOR MUSCLE SPASMS (NECK PAIN (SEVERE)). 30 tablet 5   EPINEPHrine (AUVI-Q) 0.3 mg/0.3 mL IJ SOAJ injection Inject 0.3 mg into the muscle as needed for anaphylaxis. 2 each 2   fexofenadine (ALLEGRA) 180 MG tablet Take 180 mg by mouth as needed.     fluticasone (FLONASE) 50 MCG/ACT nasal spray Place 2 sprays into both nostrils daily. 16 g 5   hydrOXYzine (ATARAX/VISTARIL) 10 MG tablet Take 10 mg by mouth 3 (three) times daily as needed for itching.     ibuprofen (ADVIL) 800 MG tablet TAKE 1 TABLET BY MOUTH EVERY 8 HOURS AS NEEDED FOR CRAMPS 30 tablet 2   levocetirizine (XYZAL) 5 MG tablet Take  1 tablet (5 mg total) by mouth as needed for allergies. 90 tablet 1   lisinopril-hydrochlorothiazide (ZESTORETIC) 20-25 MG tablet TAKE 1 TABLET BY MOUTH EVERY DAY  90 tablet 3   metFORMIN (GLUCOPHAGE-XR) 500 MG 24 hr tablet Take 1 tablet (500 mg total) by mouth daily with breakfast. 90 tablet 2   potassium chloride SA (KLOR-CON M) 20 MEQ tablet TAKE 2 PER DAY FOR 5 DAYS THEN 1 BY MOUTH EVERY DAY. 40 tablet 0   rosuvastatin (CRESTOR) 10 MG tablet Take 1 tablet (10 mg total) by mouth daily. 90 tablet 2   tirzepatide (MOUNJARO) 5 MG/0.5ML Pen Inject 5 mg into the skin once a week. Dosage change 6 mL 2   tiZANidine (ZANAFLEX) 2 MG tablet TAKE 1 TABLET BY MOUTH AT BEDTIME AS NEEDED FOR MUSCLE SPASMS. 90 tablet 2   cefdinir (OMNICEF) 300 MG capsule Take 300 mg by mouth every 12 (twelve) hours. (Patient not taking: Reported on 02/12/2023)     hydrocortisone 2.5 % cream Apply twice daily for flare ups, maximum 7 days. (Patient not taking: Reported on 02/12/2023) 30 g 0   Olopatadine-Mometasone (RYALTRIS) 665-25 MCG/ACT SUSP Place 2 sprays into the nose 2 (two) times daily as needed. (Patient not taking: Reported on 02/12/2023) 29 g 5   omeprazole (PRILOSEC) 20 MG capsule TAKE 1 CAPSULE (20 MG TOTAL) BY MOUTH 2 (TWO) TIMES DAILY BEFORE A MEAL. AS DIRECTED (Patient taking differently: Take 20 mg by mouth daily. As directed) 180 capsule 1   predniSONE (DELTASONE) 10 MG tablet Take two tablets (20mg ) twice daily for three days, then one tablet (10mg ) twice daily for three days, then STOP. (Patient not taking: Reported on 02/12/2023) 18 tablet 0   No facility-administered medications prior to visit.     EXAM:  BP 110/74 (BP Location: Left Arm, Patient Position: Sitting, Cuff Size: Large)   Pulse 79   Temp 98.3 F (36.8 C) (Oral)   Ht 5' 7.72" (1.72 m)   Wt 265 lb 6.4 oz (120.4 kg)   SpO2 97%   BMI 40.69 kg/m   Body mass index is 40.69 kg/m. Wt Readings from Last 3 Encounters:  02/12/23 265 lb 6.4 oz (120.4 kg)  11/08/22 270 lb (122.5 kg)  10/17/22 268 lb 2 oz (121.6 kg)    GENERAL: vitals reviewed and listed above, alert, oriented, appears well hydrated and  in no acute distress MS: moves all extremities without noticeable focal  abnormality PSYCH: pleasant and cooperative, no obvious depression or anxiety Lab Results  Component Value Date   WBC 5.9 09/24/2022   HGB 13.4 09/24/2022   HCT 40.8 09/24/2022   PLT 340.0 09/24/2022   GLUCOSE 98 11/08/2022   CHOL 122 09/24/2022   TRIG 90.0 09/24/2022   HDL 37.20 (L) 09/24/2022   LDLCALC 66 09/24/2022   ALT 25 09/24/2022   AST 25 09/24/2022   NA 138 11/08/2022   K 3.6 11/08/2022   CL 97 11/08/2022   CREATININE 0.84 11/08/2022   BUN 13 11/08/2022   CO2 34 (H) 11/08/2022   TSH 0.86 09/24/2022   HGBA1C 6.2 (A) 02/12/2023   MICROALBUR 1.1 09/24/2022   BP Readings from Last 3 Encounters:  02/12/23 110/74  11/08/22 (!) 142/88  10/17/22 126/82    ASSESSMENT AND PLAN:  Discussed the following assessment and plan:  Type 2 diabetes mellitus with hyperglycemia, without long-term current use of insulin (HCC) - Plan: POC HgB A1c  Medication management  Morbid obesity, unspecified obesity type (HCC)  Essential hypertension All condition improve  consider in 7.5 mg per week if indicated  contact us before next visit  for dosing change.   Otherwise cpe  in July .   -Patient advised to return or notify health care team  if  new concerns arise.  Patient Instructions  Plan  CPE in July . Can do labs  pre visit  if wish : call ahead so I can order.   When  running out can contact us   about increase to 7.5 mg  per week  on Mounjaro .  A1c is 6.2 good.   [name]. Lanier Felty M.D.

## 2023-02-12 NOTE — Patient Instructions (Addendum)
Plan  CPE in July . Can do labs  pre visit  if wish : call ahead so I can order.   When  running out can contact us   about increase to 7.5 mg  per week  on Mounjaro .  A1c is 6.2 good.

## 2023-02-17 ENCOUNTER — Other Ambulatory Visit: Payer: Self-pay | Admitting: Internal Medicine

## 2023-02-26 ENCOUNTER — Encounter: Payer: Self-pay | Admitting: Internal Medicine

## 2023-02-26 LAB — FECAL OCCULT BLOOD, IMMUNOCHEMICAL: Fecal Globin Immuno: NEGATIVE

## 2023-02-27 MED ORDER — OMEPRAZOLE 20 MG PO CPDR: 20.0000 mg | DELAYED_RELEASE_CAPSULE | Freq: Two times a day (BID) | ORAL | 1 refills | Status: DC

## 2023-03-05 ENCOUNTER — Emergency Department (HOSPITAL_COMMUNITY)
Admission: EM | Admit: 2023-03-05 | Discharge: 2023-03-05 | Disposition: A | Discharge status: Home / Self Care | Payer: BC Managed Care – PPO | Attending: Emergency Medicine | Admitting: Emergency Medicine

## 2023-03-05 ENCOUNTER — Emergency Department (HOSPITAL_COMMUNITY): Payer: BC Managed Care – PPO

## 2023-03-05 ENCOUNTER — Encounter (HOSPITAL_COMMUNITY): Payer: Self-pay

## 2023-03-05 ENCOUNTER — Other Ambulatory Visit: Payer: Self-pay

## 2023-03-05 DIAGNOSIS — I10 Essential (primary) hypertension: Secondary | ICD-10-CM | POA: Insufficient documentation

## 2023-03-05 DIAGNOSIS — E119 Type 2 diabetes mellitus without complications: Secondary | ICD-10-CM | POA: Insufficient documentation

## 2023-03-05 DIAGNOSIS — Z79899 Other long term (current) drug therapy: Secondary | ICD-10-CM | POA: Diagnosis not present

## 2023-03-05 DIAGNOSIS — E785 Hyperlipidemia, unspecified: Secondary | ICD-10-CM

## 2023-03-05 DIAGNOSIS — M546 Pain in thoracic spine: Secondary | ICD-10-CM | POA: Diagnosis present

## 2023-03-05 DIAGNOSIS — Z7984 Long term (current) use of oral hypoglycemic drugs: Secondary | ICD-10-CM | POA: Diagnosis not present

## 2023-03-05 DIAGNOSIS — E1165 Type 2 diabetes mellitus with hyperglycemia: Secondary | ICD-10-CM

## 2023-03-05 DIAGNOSIS — G8929 Other chronic pain: Secondary | ICD-10-CM | POA: Diagnosis not present

## 2023-03-05 DIAGNOSIS — R0781 Pleurodynia: Secondary | ICD-10-CM | POA: Insufficient documentation

## 2023-03-05 DIAGNOSIS — R109 Unspecified abdominal pain: Secondary | ICD-10-CM | POA: Diagnosis not present

## 2023-03-05 DIAGNOSIS — Z0000 Encounter for general adult medical examination without abnormal findings: Secondary | ICD-10-CM

## 2023-03-05 MED ORDER — NAPROXEN 500 MG PO TABS: 500.0000 mg | ORAL_TABLET | Freq: Two times a day (BID) | ORAL | 0 refills | Status: DC

## 2023-03-05 MED ORDER — NAPROXEN 250 MG PO TABS
375.0000 mg | ORAL_TABLET | Freq: Once | ORAL | Status: AC
  Administered 2023-03-05: 375 mg via ORAL
  Filled 2023-03-05: qty 2

## 2023-03-05 NOTE — ED Provider Notes (Signed)
 [place] EMERGENCY DEPARTMENT AT [facility] Provider Note   CSN: [id number] Arrival date & time: 03/05/23  1326     History  Chief Complaint  Patient presents with   Back Pain    Beth Wallace is a 58 y.o. female with a history including hypertension, GERD, type 2 diabetes, heart murmur surgical history including cholecystectomy and hysterectomy presenting with intermittent bilateral flank pain which she describes as soreness to palpation or with positional changes to her bilateral lower posterior ribs.  She does endorse a significant weight loss as she has been on Mounjaro since last summer, she notes when she lays on either side she gets restless as she gets uncomfortable in this location, described as a soreness which worsens with palpation and with movement such as rolling from one side to the other.  She describes a muscle spasm like sensation but it is just sore to palpation.  She has seen her primary doctor for this, has been on muscle relaxers and has had no significant improvement.  She denies chest pain, shortness of breath, cough, has no midline thoracic pain, rash, denies urinary complaints, hematuria or dysuria.  No history of kidney stones.  The history is provided by the patient.       Home Medications Prior to Admission medications   Medication Sig Start Date End Date Taking? Authorizing Provider  naproxen (NAPROSYN) 500 MG tablet Take 1 tablet (500 mg total) by mouth 2 (two) times daily. 03/05/23  Yes Lashundra Shiveley, Raynelle Fanning, PA-C  ALPRAZolam Prudy Feeler) 0.5 MG tablet TAKE 1 TABLET BY MOUTH THREE TIMES A DAY AS NEEDED FOR ANXIETY 07/11/22   Panosh, [name], MD  azelastine (ASTELIN) 0.1 % nasal spray Place 1 spray into both nostrils 2 (two) times daily as needed. Use in each nostril as directed 08/22/22   Birder Robson, MD  Azelastine-Fluticasone 137-50 MCG/ACT SUSP     [provider]  cefdinir (OMNICEF) 300 MG capsule Take 300 mg by mouth every 12 (twelve)  hours. Patient not taking: Reported on 02/12/2023 10/12/22   [provider]  cetirizine (ZYRTEC) 10 MG tablet Take 10 mg by mouth as needed.    [provider]  chlorhexidine (PERIDEX) 0.12 % solution 15 mLs as needed. 05/23/19   [provider]  cyclobenzaprine (FLEXERIL) 5 MG tablet TAKE 1 TABLET (5 MG TOTAL) BY MOUTH AT BEDTIME AS NEEDED FOR MUSCLE SPASMS (NECK PAIN (SEVERE)). 01/22/23   Nita Sickle K, DO  EPINEPHrine (AUVI-Q) 0.3 mg/0.3 mL IJ SOAJ injection Inject 0.3 mg into the muscle as needed for anaphylaxis. 10/17/22   Alfonse Spruce, MD  fexofenadine (ALLEGRA) 180 MG tablet Take 180 mg by mouth as needed.    [provider]  fluticasone (FLONASE) 50 MCG/ACT nasal spray Place 2 sprays into both nostrils daily. 08/22/22   Birder Robson, MD  hydrocortisone 2.5 % cream Apply twice daily for flare ups, maximum 7 days. Patient not taking: Reported on 02/12/2023 08/22/22   Birder Robson, MD  hydrOXYzine (ATARAX/VISTARIL) 10 MG tablet Take 10 mg by mouth 3 (three) times daily as needed for itching.    [provider]  ibuprofen (ADVIL) 800 MG tablet TAKE 1 TABLET BY MOUTH EVERY 8 HOURS AS NEEDED FOR CRAMPS 08/08/22   Panosh, [name], MD  levocetirizine (XYZAL) 5 MG tablet Take 1 tablet (5 mg total) by mouth as needed for allergies. 12/12/22   Birder Robson, MD  lisinopril-hydrochlorothiazide (ZESTORETIC) 20-25 MG tablet TAKE 1 TABLET  BY MOUTH EVERY DAY 07/29/22   Worthy Rancher B, FNP  metFORMIN (GLUCOPHAGE-XR) 500 MG 24 hr tablet Take 1 tablet (500 mg total) by mouth daily with breakfast. 08/08/22   Panosh, [name], MD  Olopatadine-Mometasone (RYALTRIS) [phone] MCG/ACT SUSP Place 2 sprays into the nose 2 (two) times daily as needed. Patient not taking: Reported on 02/12/2023 10/17/22   Alfonse Spruce, MD  omeprazole (PRILOSEC) 20 MG capsule Take 1 capsule (20 mg total) by mouth 2 (two) times daily before a meal. As directed 02/27/23   Panosh, [name],  MD  potassium chloride SA (KLOR-CON M) 20 MEQ tablet TAKE 2 PER DAY FOR 5 DAYS THEN 1 BY MOUTH EVERY DAY. 02/17/23   Worthy Rancher B, FNP  rosuvastatin (CRESTOR) 10 MG tablet Take 1 tablet (10 mg total) by mouth daily. 08/08/22   Panosh, [name], MD  tirzepatide [facility]) 5 MG/0.5ML Pen Inject 5 mg into the skin once a week. Dosage change 08/08/22   Panosh, [name], MD  tiZANidine (ZANAFLEX) 2 MG tablet TAKE 1 TABLET BY MOUTH AT BEDTIME AS NEEDED FOR MUSCLE SPASMS. 09/04/21   Nita Sickle K, DO      Allergies    Bee venom, Contrast media [iodinated contrast media], and Other    Review of Systems   Review of Systems  Constitutional:  Negative for chills and fever.  HENT:  Negative for congestion.   Eyes: Negative.   Respiratory:  Negative for chest tightness and shortness of breath.   Cardiovascular:  Negative for chest pain.  Gastrointestinal:  Negative for abdominal pain, nausea and vomiting.  Genitourinary: Negative.  Negative for dysuria.  Musculoskeletal:  Negative for arthralgias, joint swelling and neck pain.  Skin: Negative.  Negative for rash and wound.  Neurological:  Negative for dizziness, weakness, light-headedness, numbness and headaches.  Psychiatric/Behavioral: Negative.    All other systems reviewed and are negative.   Physical Exam Updated Vital Signs BP (!) 143/84   Pulse 77   Temp 98.3 F (36.8 C)   Resp 16   Ht 5' 7.72" (1.72 m)   Wt 117.5 kg   SpO2 97%   BMI 39.71 kg/m  Physical Exam Vitals and nursing note reviewed.  Constitutional:      Appearance: She is well-developed.  HENT:     Head: Normocephalic and atraumatic.  Eyes:     Conjunctiva/sclera: Conjunctivae normal.  Cardiovascular:     Rate and Rhythm: Normal rate and regular rhythm.     Heart sounds: Normal heart sounds.  Pulmonary:     Effort: Pulmonary effort is normal.     Breath sounds: Normal breath sounds. No stridor or decreased air movement. No wheezing or rhonchi.       Comments:  Patient is tender to palpation along her bilateral posterior lower ribs.  She has no CVA tenderness, her pain is more in the lateral mid back.  No rash or erythema, no crepitus, no appreciable muscle spasm.  She has no midline thoracic or lumbar pain. Abdominal:     General: Bowel sounds are normal.     Palpations: Abdomen is soft.     Tenderness: There is no abdominal tenderness.  Musculoskeletal:        General: Normal range of motion.     Cervical back: Normal range of motion.  Skin:    General: Skin is warm and dry.  Neurological:     Mental Status: She is alert.     ED Results / Procedures /  Treatments   Labs (all labs ordered are listed, but only abnormal results are displayed) Labs Reviewed - No data to display  EKG None  Radiology DG Ribs Unilateral W/Chest Left Result Date: 03/05/2023 CLINICAL DATA:  Left-sided posterior rib pain, initial encounter EXAM: LEFT RIBS AND CHEST - 3+ VIEW COMPARISON:  03/12/2011 FINDINGS: Cardiac shadow is within normal limits. Lungs are well aerated bilaterally. No focal infiltrate or effusion is seen. No bony abnormality is noted. Specifically no rib abnormality is noted on the left. IMPRESSION: No acute rib abnormality noted. Electronically Signed   By: Alcide Clever M.D.   On: 03/05/2023 23:06    Procedures Procedures    Medications Ordered in ED Medications  naproxen (NAPROSYN) tablet 375 mg (375 mg Oral Given 03/05/23 2236)    ED Course/ Medical Decision Making/ A&P                                 Medical Decision Making Patient presenting with reproducible tenderness with palpation and movement at her bilateral posterior lower rib cage, suggesting that this is a musculoskeletal source.  She has no urinary symptoms, no CVA pain, no fevers, no dysuria, chronicity of symptoms makes UTI/Pilo/kidney stones highly unlikely, especially given bilateral nature of her symptoms.  She has no shortness of breath or cough, pulmonary source  unlikely.  No rash, doubt shingles, and again symptoms are bilateral.  She is encouraged to complete a 10-day course of naproxen, she states she has taken ibuprofen in the past but only a tablet here or there, not consistently as she does not like to take medications.  Encouraged to take twice daily x 10 days along with heating pad therapy and close follow-up with her primary provider.  Amount and/or Complexity of Data Reviewed Radiology: ordered.    Details: Imaging reviewed, negative, agree with interpretation.  Risk Prescription drug management.           Final Clinical Impression(s) / ED Diagnoses Final diagnoses:  Chronic bilateral thoracic back pain    Rx / DC Orders ED Discharge Orders          Ordered    naproxen (NAPROSYN) 500 MG tablet  2 times daily        03/05/23 2315              Burgess Amor, Cordelia Poche 03/05/23 2319    Jacalyn Lefevre, MD 03/06/23 0001

## 2023-03-05 NOTE — ED Triage Notes (Signed)
 Pt has had back lower pain x 5 months and now she is having some pain around her groin as well. Pt stated PCP just gives her meds that doesn't work. Pt ambulated in triage.

## 2023-03-05 NOTE — ED Notes (Signed)
 Patient transported to X-ray

## 2023-03-05 NOTE — ED Notes (Signed)
 Pt back from xray at this time.

## 2023-03-05 NOTE — Discharge Instructions (Addendum)
 I suspect your discomfort is musculoskeletal in nature, possibly a chronic costochondritis of your rib cage.  Your x-ray is reassuringly normal however.  I do recommend that you consistently take an anti-inflammatory for the next 10 days to see if this will relieve your symptoms.  I also recommend a gentle heating pad for 20 minutes applied to your back 2-3 times daily.  Plan follow-up with your primary provider if your symptoms are not improving.

## 2023-03-07 ENCOUNTER — Ambulatory Visit: Payer: BC Managed Care – PPO | Admitting: Internal Medicine

## 2023-03-11 ENCOUNTER — Ambulatory Visit: Payer: BC Managed Care – PPO | Admitting: Family Medicine

## 2023-03-15 ENCOUNTER — Other Ambulatory Visit: Payer: Self-pay | Admitting: Internal Medicine

## 2023-03-15 DIAGNOSIS — I10 Essential (primary) hypertension: Secondary | ICD-10-CM

## 2023-03-15 DIAGNOSIS — Z0000 Encounter for general adult medical examination without abnormal findings: Secondary | ICD-10-CM

## 2023-03-15 DIAGNOSIS — Z79899 Other long term (current) drug therapy: Secondary | ICD-10-CM

## 2023-03-15 DIAGNOSIS — E1165 Type 2 diabetes mellitus with hyperglycemia: Secondary | ICD-10-CM

## 2023-03-15 DIAGNOSIS — E785 Hyperlipidemia, unspecified: Secondary | ICD-10-CM

## 2023-04-08 LAB — HM MAMMOGRAPHY

## 2023-04-10 ENCOUNTER — Ambulatory Visit (INDEPENDENT_AMBULATORY_CARE_PROVIDER_SITE_OTHER): Payer: BC Managed Care – PPO | Admitting: Internal Medicine

## 2023-04-10 ENCOUNTER — Encounter: Payer: Self-pay | Admitting: Internal Medicine

## 2023-04-10 VITALS — BP 110/72 | HR 83 | Temp 99.1°F | Wt 263.0 lb

## 2023-04-10 DIAGNOSIS — Z7985 Long-term (current) use of injectable non-insulin antidiabetic drugs: Secondary | ICD-10-CM

## 2023-04-10 DIAGNOSIS — Z79899 Other long term (current) drug therapy: Secondary | ICD-10-CM

## 2023-04-10 DIAGNOSIS — E1165 Type 2 diabetes mellitus with hyperglycemia: Secondary | ICD-10-CM

## 2023-04-10 DIAGNOSIS — I10 Essential (primary) hypertension: Secondary | ICD-10-CM | POA: Diagnosis not present

## 2023-04-10 DIAGNOSIS — R109 Unspecified abdominal pain: Secondary | ICD-10-CM

## 2023-04-10 DIAGNOSIS — Z87448 Personal history of other diseases of urinary system: Secondary | ICD-10-CM | POA: Diagnosis not present

## 2023-04-10 DIAGNOSIS — R202 Paresthesia of skin: Secondary | ICD-10-CM

## 2023-04-10 LAB — URINALYSIS, ROUTINE W REFLEX MICROSCOPIC
Bilirubin Urine: NEGATIVE
Ketones, ur: NEGATIVE
Leukocytes,Ua: NEGATIVE
Nitrite: NEGATIVE
Specific Gravity, Urine: 1.025 (ref 1.000–1.030)
Total Protein, Urine: NEGATIVE
Urine Glucose: NEGATIVE
Urobilinogen, UA: 0.2 (ref 0.0–1.0)
pH: 6 (ref 5.0–8.0)

## 2023-04-10 LAB — BASIC METABOLIC PANEL WITH GFR
BUN: 12 mg/dL (ref 6–23)
CO2: 30 meq/L (ref 19–32)
Calcium: 9.9 mg/dL (ref 8.4–10.5)
Chloride: 98 meq/L (ref 96–112)
Creatinine, Ser: 0.86 mg/dL (ref 0.40–1.20)
GFR: 75.89 mL/min (ref >60.00)
Glucose, Bld: 92 mg/dL (ref 70–99)
Potassium: 4.2 meq/L (ref 3.5–5.1)
Sodium: 136 meq/L (ref 135–145)

## 2023-04-10 LAB — MICROALBUMIN / CREATININE URINE RATIO
Creatinine,U: 171.4 mg/dL
Microalb Creat Ratio: 7.3 mg/g (ref 0.0–30.0)
Microalb, Ur: 1.3 mg/dL (ref 0.0–1.9)

## 2023-04-10 MED ORDER — IBUPROFEN 800 MG PO TABS: ORAL_TABLET | ORAL | 2 refills | Status: DC

## 2023-04-10 MED ORDER — TIRZEPATIDE 7.5 MG/0.5ML ~~LOC~~ SOAJ: 7.5000 mg | SUBCUTANEOUS | 2 refills | Status: DC

## 2023-04-10 NOTE — Progress Notes (Signed)
 No chief complaint on file.   HPI: Beth Wallace 58 y.o. come in for   had ed visit for same sx   bilaterally mostly left flank pain feels tight sometimes takes muscle relaxant   is concern it could be kidneys  no fever  sx off and on   Had urine clear through microscopic blood  ur culture  negative    sx is like    like bulging left flank  Says had urolog eval in 2017 for micro blood and  poss IC  but no hematuria gross.  Gest tingling at times  feels needs to hydrate better on Mounjauro but has been helpful . Wants to go up on dose as possible  no gi se  weight steady .  Stress the same coping ok may retire in July .  ROS: See pertinent positives and negatives per HPI.  Past Medical History:  Diagnosis Date   ADJ DISORDER WITH MIXED ANXIETY & DEPRESSED MOOD 12/14/2009   Qualifier: Diagnosis of  By: Fabian Sharp MD, [name]    Allergic reaction 05/18/2011   Allergy    SEASONAL    Anxiety    Diabetes mellitus without complication (HCC)    pre diabetic- not started Metformin - diet and exercise to control    GERD (gastroesophageal reflux disease)    Heart murmur    slight years ago    History of gallstones    HTN (hypertension)    Metrorrhagia 12/14/2009   Qualifier: Diagnosis of  By: Fabian Sharp MD, [name] With pelvic pain  Sees Dr Henderson Cloud and tobotic hysterectomy scheduled    Neuromuscular disorder (HCC)    cervical issues to arms   Obesity    Rapid palpitations 01/23/2012   Probably related to stress had negative cardiac workup in the past rule out metabolic    TMJ (temporomandibular joint disorder)     Family History  Problem Relation Age of Onset   Heart disease Mother 53       Died age 98, MI   Heart attack Mother    Hypertension Mother    Hypertension Father        Living   Diabetes Father    Heart disease Father    Other Sister        Killed, age 57   Other Daughter        Killed, age 34   Colon polyps Maternal Aunt    Hyperlipidemia Neg Hx    Sudden death  Neg Hx    Colon cancer Neg Hx    Esophageal cancer Neg Hx    Rectal cancer Neg Hx    Stomach cancer Neg Hx     Social History   Socioeconomic History   Marital status: Married    Spouse name: Not on file   Number of children: 1   Years of education: 16   Highest education level: Associate degree: occupational, Scientist, product/process development, or vocational program  Occupational History   Occupation: Event organiser: [employer]  Tobacco Use   Smoking status: Some Days    Current packs/day: 0.25    Types: Cigarettes   Smokeless tobacco: Never   Tobacco comments:    Pt.smokes some days   Vaping Use   Vaping status: Never Used  Substance and Sexual Activity   Alcohol use: No    Alcohol/week: 0.0 standard drinks of alcohol   Drug use: No   Sexual activity: Not on file  Other Topics Concern  Not on file  Social History Narrative   Orig from [place] and changes for job,     7-8 hours sleep per night     Born in [place], [place],     [employer],     Geographical information systems officer,    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.,       Back in school gen ed   At [employer],      [facility] of 2,     BF and dog,     G3P1,     Bereaved parent daughter murdered 2008,   Trial finished  Claris Gower guilty this week march    No etoh,      No caffeine,     Minimal tobacco,       ETS     Got married feb 14    Right Handed   Social Drivers of Health   Financial Resource Strain: Low Risk  (04/09/2023)   Overall Financial Resource Strain (CARDIA)    Difficulty of Paying Living Expenses: Not hard at all  Food Insecurity: No Food Insecurity (04/09/2023)   Hunger Vital Sign    Worried About Running Out of Food in the Last Year: Never true    Ran Out of Food in the Last Year: Never true  Transportation Needs: No Transportation Needs (04/09/2023)   PRAPARE - Administrator, Civil Service (Medical): No    Lack of Transportation (Non-Medical): No  Physical Activity: Inactive (04/09/2023)   Exercise  Vital Sign    Days of Exercise per Week: 1 day    Minutes of Exercise per Session: 0 min  Stress: No Stress Concern Present (04/09/2023)   [employer] of Occupational Health - Occupational Stress Questionnaire    Feeling of Stress : Only a little  Social Connections: Unknown (04/09/2023)   Social Connection and Isolation Panel [NHANES]    Frequency of Communication with Friends and Family: More than three times a week    Frequency of Social Gatherings with Friends and Family: Once a week    Attends Religious Services: 1 to 4 times per year    Active Member of [employer] or Organizations: Patient declined    Attends Banker Meetings: Not on file    Marital Status: Widowed    Outpatient Medications Prior to Visit  Medication Sig Dispense Refill   ALPRAZolam (XANAX) 0.5 MG tablet TAKE 1 TABLET BY MOUTH THREE TIMES A DAY AS NEEDED FOR ANXIETY 24 tablet 0   azelastine (ASTELIN) 0.1 % nasal spray Place 1 spray into both nostrils 2 (two) times daily as needed. Use in each nostril as directed 30 mL 5   Azelastine-Fluticasone 137-50 MCG/ACT SUSP      cetirizine (ZYRTEC) 10 MG tablet Take 10 mg by mouth as needed.     chlorhexidine (PERIDEX) 0.12 % solution 15 mLs as needed.     cyclobenzaprine (FLEXERIL) 5 MG tablet TAKE 1 TABLET (5 MG TOTAL) BY MOUTH AT BEDTIME AS NEEDED FOR MUSCLE SPASMS (NECK PAIN (SEVERE)). 30 tablet 5   EPINEPHrine (AUVI-Q) 0.3 mg/0.3 mL IJ SOAJ injection Inject 0.3 mg into the muscle as needed for anaphylaxis. 2 each 2   fexofenadine (ALLEGRA) 180 MG tablet Take 180 mg by mouth as needed.     fluticasone (FLONASE) 50 MCG/ACT nasal spray Place 2 sprays into both nostrils daily. 16 g 5   hydrocortisone 2.5 % cream Apply twice daily for flare ups, maximum  7 days. 30 g 0   hydrOXYzine (ATARAX/VISTARIL) 10 MG tablet Take 10 mg by mouth 3 (three) times daily as needed for itching.     levocetirizine (XYZAL) 5 MG tablet Take 1 tablet (5 mg total) by mouth as needed for  allergies. 90 tablet 1   lisinopril-hydrochlorothiazide (ZESTORETIC) 20-25 MG tablet TAKE 1 TABLET BY MOUTH EVERY DAY 90 tablet 3   metFORMIN (GLUCOPHAGE-XR) 500 MG 24 hr tablet TAKE 1 TABLET BY MOUTH EVERY DAY WITH BREAKFAST 90 tablet 2   Olopatadine-Mometasone (RYALTRIS) 665-25 MCG/ACT SUSP Place 2 sprays into the nose 2 (two) times daily as needed. 29 g 5   omeprazole (PRILOSEC) 20 MG capsule Take 1 capsule (20 mg total) by mouth 2 (two) times daily before a meal. As directed 180 capsule 1   potassium chloride SA (KLOR-CON M) 20 MEQ tablet TAKE 2 PER DAY FOR 5 DAYS THEN 1 BY MOUTH EVERY DAY. 120 tablet 1   rosuvastatin (CRESTOR) 10 MG tablet TAKE 1 TABLET BY MOUTH EVERY DAY 90 tablet 2   tiZANidine (ZANAFLEX) 2 MG tablet TAKE 1 TABLET BY MOUTH AT BEDTIME AS NEEDED FOR MUSCLE SPASMS. 90 tablet 2   ibuprofen (ADVIL) 800 MG tablet TAKE 1 TABLET BY MOUTH EVERY 8 HOURS AS NEEDED FOR CRAMPS 30 tablet 2   tirzepatide (MOUNJARO) 5 MG/0.5ML Pen Inject 5 mg into the skin once a week. Dosage change 6 mL 2   cefdinir (OMNICEF) 300 MG capsule Take 300 mg by mouth every 12 (twelve) hours. (Patient not taking: Reported on 04/10/2023)     naproxen (NAPROSYN) 500 MG tablet Take 1 tablet (500 mg total) by mouth 2 (two) times daily. (Patient not taking: Reported on 04/10/2023) 20 tablet 0   No facility-administered medications prior to visit.     EXAM:  BP 110/72   Pulse 83   Temp 99.1 F (37.3 C) (Oral)   Wt 263 lb (119.3 kg)   SpO2 99%   BMI 40.32 kg/m   Body mass index is 40.32 kg/m. Wt Readings from Last 3 Encounters:  04/10/23 263 lb (119.3 kg)  03/05/23 259 lb (117.5 kg)  02/12/23 265 lb 6.4 oz (120.4 kg)    GENERAL: vitals reviewed and listed above, alert, oriented, appears well hydrated and in no acute distress HEENT: atraumatic, conjunctiva  clear, no obvious abnormalities on inspection of external nose and ears OP : no lesion edema or exudate  NECK: no obvious masses on inspection  palpation  Back  midline non tender  area of concer is left flank but no cva tenderness nl gait and no abd tenderness  mobile  CV: HRRR, no clubbing cyanosis or  peripheral edema nl cap refill  MS: moves all extremities without noticeable focal  abnormality PSYCH: pleasant and cooperative, no obvious depression or anxiety Lab Results  Component Value Date   WBC 5.9 09/24/2022   HGB 13.4 09/24/2022   HCT 40.8 09/24/2022   PLT 340.0 09/24/2022   GLUCOSE 98 11/08/2022   CHOL 122 09/24/2022   TRIG 90.0 09/24/2022   HDL 37.20 (L) 09/24/2022   LDLCALC 66 09/24/2022   ALT 25 09/24/2022   AST 25 09/24/2022   NA 138 11/08/2022   K 3.6 11/08/2022   CL 97 11/08/2022   CREATININE 0.84 11/08/2022   BUN 13 11/08/2022   CO2 34 (H) 11/08/2022   TSH 0.86 09/24/2022   HGBA1C 6.2 (A) 02/12/2023   MICROALBUR 1.1 09/24/2022   BP Readings from Last 3 Encounters:  04/10/23 110/72  03/05/23 (!) 156/81  02/12/23 110/74   ED record review    exam and no labs but x ray done  ASSESSMENT AND PLAN:  Discussed the following assessment and plan:  Flank pain - Plan: Basic metabolic panel, Microalbumin / creatinine urine ratio, Urinalysis, Routine w reflex microscopic, Vitamin B12  History of hematuria - POss IC - Plan: Basic metabolic panel, Microalbumin / creatinine urine ratio, Urinalysis, Routine w reflex microscopic, Vitamin B12  Type 2 diabetes mellitus with hyperglycemia, without long-term current use of insulin (HCC) - Plan: Basic metabolic panel, Microalbumin / creatinine urine ratio, Urinalysis, Routine w reflex microscopic, Vitamin B12, ibuprofen (ADVIL) 800 MG tablet  Medication management - Plan: Basic metabolic panel, Microalbumin / creatinine urine ratio, Urinalysis, Routine w reflex microscopic, Vitamin B12, ibuprofen (ADVIL) 800 MG tablet, Basic Metabolic Panel  Morbid obesity, unspecified obesity type (HCC) - Plan: Basic metabolic panel, Microalbumin / creatinine urine ratio,  Urinalysis, Routine w reflex microscopic, Vitamin B12  Tingling - Plan: Basic metabolic panel, Microalbumin / creatinine urine ratio, Urinalysis, Routine w reflex microscopic, Vitamin B12  Essential hypertension - Plan: ibuprofen (ADVIL) 800 MG tablet Suspecting recurring sx from muslce spasm and encouraged attention to hydration on mounjaro as well as exercise  Has hx of micfo hematuria dn eval in past ? IC  and  recent  no uti culture by gune   Reviewed diff dx and poss  mpst likley ms cause  but we can update renal function and urine  if indicated consider ct r/o stone  doubt or renal US  . But at this time update and follow . Great caution with nsaids  says only uses about 2  per month Check b12 level with tingling but could be se of meds and hydration Will fu  at her cpe in summer or as indicated   Ok to inc mounjaro dose  to 7.5 weekly but need to add hydration and resistance exercise  to regimen( she say s has los almost 50 # so far? Record reviewi  urology -Patient advised to return or notify health care team  if  new concerns arise.  Patient Instructions  This is probably  muscle spasm.   But fu urine and kidney function If appropriate .  We can always do kidney imaging.  Increase  the mounjaro  as planned .  Keep  summer appt .   [name]. Rozelia Catapano M.D.

## 2023-04-10 NOTE — Patient Instructions (Addendum)
 This is probably  muscle spasm.   But fu urine and kidney function If appropriate .  We can always do kidney imaging.  Increase  the mounjaro  as planned .  Keep  summer appt .

## 2023-04-11 LAB — VITAMIN B12: Vitamin B-12: 431 pg/mL (ref 211–911)

## 2023-04-15 ENCOUNTER — Encounter: Payer: Self-pay | Admitting: Internal Medicine

## 2023-04-15 NOTE — Progress Notes (Signed)
 B12 was in normal range ,kidney function is normal , no urine protein in urine . These results are favorable .

## 2023-04-30 ENCOUNTER — Telehealth: Payer: Self-pay | Admitting: *Deleted

## 2023-04-30 NOTE — Telephone Encounter (Signed)
 Copied from CRM [id number]. Topic: Clinical - Medication Question >> Apr 30, 2023 12:44 PM Beth Wallace wrote: Reason for CRM: pt called to advise she does not like the manjaro, 7 mg, would like to change it back to 5mg  manjaro and stay there. Please call pt back [phone]

## 2023-05-06 NOTE — Telephone Encounter (Signed)
 Attempt to reach out to pt. Left a voicemail to call us  back.

## 2023-05-09 ENCOUNTER — Ambulatory Visit: Admitting: Family Medicine

## 2023-05-09 NOTE — Telephone Encounter (Signed)
 Pt return a call.   Pt reports Mounjaro  7.5mg  makes her feels "too full" and " had to force myself to eat". Pt states she wants to go back to 5mg  and that works for her. She could still eat when she was hungry but with this 7.5mg , it overly suppressed her appetite.  Pt denied other side effects. No nausea and vomitting.   Pt continues she had pick up 90 days supplies and concerns her insurance won't cover it if go back to 5mg .   Inform pt provider is out for today and may response in the weekend if online or on Tuesday. Pt verbalized understanding.

## 2023-05-09 NOTE — Telephone Encounter (Signed)
 She can go back down to the 5 mg weekly  but if she has a supply of the 7.5 she can consider taking the left over 7.5 every 10 - 14 days instead of  every 7 days   but if feels too bad doesn't have to do this.  An send in 1 month of 5 mg mounjaro  with comment about side effects with 5 mg     and refill x 1

## 2023-05-13 MED ORDER — TIRZEPATIDE 5 MG/0.5ML ~~LOC~~ SOAJ
5.0000 mg | SUBCUTANEOUS | 1 refills | Status: AC
Start: 2023-05-13 — End: ?

## 2023-05-13 NOTE — Telephone Encounter (Signed)
 Spoke to pt and went over provider's message. Pt verbalized understanding.  Will send Rx in.

## 2023-05-13 NOTE — Telephone Encounter (Signed)
 Rx 1 month supply of Mounjaro  5mg  with 1 refill sent.

## 2023-05-16 ENCOUNTER — Ambulatory Visit: Admitting: Family Medicine

## 2023-05-27 ENCOUNTER — Other Ambulatory Visit

## 2023-05-27 ENCOUNTER — Ambulatory Visit (INDEPENDENT_AMBULATORY_CARE_PROVIDER_SITE_OTHER): Payer: BC Managed Care – PPO | Admitting: Neurology

## 2023-05-27 VITALS — BP 149/87 | HR 82 | Ht 67.0 in | Wt 270.0 lb

## 2023-05-27 DIAGNOSIS — M542 Cervicalgia: Secondary | ICD-10-CM

## 2023-05-27 DIAGNOSIS — R202 Paresthesia of skin: Secondary | ICD-10-CM | POA: Diagnosis not present

## 2023-05-27 DIAGNOSIS — G44209 Tension-type headache, unspecified, not intractable: Secondary | ICD-10-CM | POA: Diagnosis not present

## 2023-05-27 LAB — TSH: TSH: 0.84 m[IU]/L

## 2023-05-27 MED ORDER — GABAPENTIN 300 MG PO CAPS
300.0000 mg | ORAL_CAPSULE | Freq: Every day | ORAL | 3 refills | Status: AC
Start: 2023-05-27 — End: ?

## 2023-05-27 MED ORDER — TIZANIDINE HCL 2 MG PO TABS: ORAL_TABLET | ORAL | 2 refills | Status: AC

## 2023-05-27 NOTE — Progress Notes (Signed)
 Follow-up Visit   Date: 05/27/23    Beth Wallace MRN: [id number] DOB: January 27, 1965   Interim History: Beth Wallace is a 58 y.o. right-handed African American female with hypertension, seasonal allergies, and GERD returning to the clinic for cervicogenic headaches. The patient was accompanied to the clinic by self.  IMPRESSION/PLAN: Intermittent dysesthesias, unclear etiology.  Neurological exam is normal.  Given that symptoms are diffuse, systemic or hormonal cause is considered.   - Check TSH.  Vitamin B12 is normal  - Start gabapentin 300mg  at bedtime for symptom relief  Cervicalgia, stable - Continue tizandine  2mg  at bedtime as needed  - Continue home neck stretches   3.   Tension headaches, stable  - Continue NSAIDs  Return to clinic 1 year  -------------------------------------------------------------------- History of present illness: Starting around January 2016, she started developing pressure over the vertex of the head and ocassionally over the forehead.  Pain is improved by walking and resting.  It is worse when she is working and lasts several hours.  It can occur daily but she usually does not notice the discomfort when she is not working.  She has tried excedrin migraine, aleve , and tylenol  none which provided significant relief.  She was given prednisone  injection but this did not help either.   She previously had migraine when pregnant which would be associated with photophobia and phonophobia.    She is a delightful lady who unfortunately has lost her 70 year old daughter and younger sister in a triple homicide.  She has a tremendous strength and integrity of character and has managed to deal with such tragedy in an effective manner.    In early 2017, she started neck physiotherapy with at least 50% improvement in her head pressure. Starting in late January 2018, she began having neck pain, described as soreness, which sometimes also involves  her arms. There is no numbness/tingling or weakness.  She feels that it may be related to how much time she spends on her computer and poor posture so is having an adjustable desk placed at home.  She tried naproxen  which helps.  She also saw a chiropractor and felt that this has improved her range of motion.   UPDATE 09/04/2021:  She is here for follow-up and reports ongoing neck stiffness and pain.  She endorses having a lot of changes as it related to her work at [employer].  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 She is not planning on moving so planning on leaving between November and spring 2024.  She endorses chronic neck pain and stiffness and continues to take tizanidine  as needed.  She admits to not doing her neck stretching exercises.  UPDATE 05/22/2022:  She went on a trip to [place] in mid-April and when she returned home, she began having severe shooting and achy right low back pain radiating into the right leg and foot.  Symptoms are constant and worse with sitting.  She is taking tizanidine  and ibuprofen .  She reports having flares of pain since 2000 when she was diagnosed with sciatica.  The last time she has severe exacerbation of pain was in 2014.  She has difficulty with walking long distances and cannot get comfortable during the visit today.  Headaches are stable and responsive to NSAIDs and tizanidine  as needed.   UPDATE 05/27/2023:  She is here for follow-up visit.  Starting around March, she began having episodic abnormal sensation of the skin. She describes it  as itching/sensitive, but she does not have the urge to itch the are.  It can occur in the fingers, scalp, legs, or feet.  There is no specific trigger.  She has noticed that the sensations are stronger prior to her menstruation.  She took one of her husband's gabapentin and felt that it significantly helped her symptoms.  Her neck pain has been doing very well.    Her husband who  had been struggling with alcoholism and poorly controlled diabetes suddenly passed away in 09/08/23. She has good social support and her two brothers are living with her currently.    Medications:  Current Outpatient Medications on File Prior to Visit  Medication Sig Dispense Refill   ALPRAZolam  (XANAX ) 0.5 MG tablet TAKE 1 TABLET BY MOUTH THREE TIMES A DAY AS NEEDED FOR ANXIETY 24 tablet 0   azelastine  (ASTELIN ) 0.1 % nasal spray Place 1 spray into both nostrils 2 (two) times daily as needed. Use in each nostril as directed 30 mL 5   Azelastine -Fluticasone  137-50 MCG/ACT SUSP      cetirizine (ZYRTEC) 10 MG tablet Take 10 mg by mouth as needed.     chlorhexidine (PERIDEX) 0.12 % solution 15 mLs as needed.     cyclobenzaprine  (FLEXERIL ) 5 MG tablet TAKE 1 TABLET (5 MG TOTAL) BY MOUTH AT BEDTIME AS NEEDED FOR MUSCLE SPASMS (NECK PAIN (SEVERE)). 30 tablet 5   EPINEPHrine  (AUVI-Q ) 0.3 mg/0.3 mL IJ SOAJ injection Inject 0.3 mg into the muscle as needed for anaphylaxis. 2 each 2   fexofenadine (ALLEGRA) 180 MG tablet Take 180 mg by mouth as needed.     fluticasone  (FLONASE ) 50 MCG/ACT nasal spray Place 2 sprays into both nostrils daily. 16 g 5   hydrocortisone  2.5 % cream Apply twice daily for flare ups, maximum 7 days. 30 g 0   hydrOXYzine  (ATARAX /VISTARIL ) 10 MG tablet Take 10 mg by mouth 3 (three) times daily as needed for itching.     ibuprofen  (ADVIL ) 800 MG tablet TAKE 1 TABLET BY MOUTH EVERY 8 HOURS AS NEEDED FOR CRAMPS 30 tablet 2   levocetirizine (XYZAL ) 5 MG tablet Take 1 tablet (5 mg total) by mouth as needed for allergies. 90 tablet 1   lisinopril -hydrochlorothiazide  (ZESTORETIC ) 20-25 MG tablet TAKE 1 TABLET BY MOUTH EVERY DAY 90 tablet 3   metFORMIN  (GLUCOPHAGE -XR) 500 MG 24 hr tablet TAKE 1 TABLET BY MOUTH EVERY DAY WITH BREAKFAST 90 tablet 2   Olopatadine-Mometasone (RYALTRIS ) 665-25 MCG/ACT SUSP Place 2 sprays into the nose 2 (two) times daily as needed. 29 g 5   omeprazole  (PRILOSEC)  20 MG capsule Take 1 capsule (20 mg total) by mouth 2 (two) times daily before a meal. As directed 180 capsule 1   potassium chloride  SA (KLOR-CON  M) 20 MEQ tablet TAKE 2 PER DAY FOR 5 DAYS THEN 1 BY MOUTH EVERY DAY. 120 tablet 1   rosuvastatin  (CRESTOR ) 10 MG tablet TAKE 1 TABLET BY MOUTH EVERY DAY 90 tablet 2   tirzepatide  (MOUNJARO ) 5 MG/0.5ML Pen Inject 5 mg into the skin once a week. 2 mL 1   tirzepatide  (MOUNJARO ) 7.5 MG/0.5ML Pen Inject 7.5 mg into the skin once a week. Dosage change 6 mL 2   tiZANidine  (ZANAFLEX ) 2 MG tablet TAKE 1 TABLET BY MOUTH AT BEDTIME AS NEEDED FOR MUSCLE SPASMS. 90 tablet 2   No current facility-administered medications on file prior to visit.    Allergies:  Allergies  Allergen Reactions   Bee Venom Shortness Of Breath  Contrast Media [Iodinated Contrast Media] Anaphylaxis    CT dye ivp Throat swelling   Other     Vital Signs:  BP (!) 149/87   Pulse 82   Ht 5\' 7"  (1.702 m)   Wt 270 lb (122.5 kg)   SpO2 100%   BMI 42.29 kg/m   Neurological Exam MENTAL STATUS including orientation to time, place, person, recent and remote memory, attention span and concentration, language, and fund of knowledge is normal.  Speech is not dysarthric.  CRANIAL NERVES:  Normal conjugate, extra-ocular eye movements in all directions of gaze.    MOTOR:  Motor strength is 5/5 in all extremities, athough there is pain-limiting weakness in the right leg diffusely.    REFLEXES:  Reflexes are 2+/4 throughout.   SENSATION:  Vibration, pin prick and temperature intact throughout.   COORDINATION/GAIT:   Gait narrow based and stable.   Data: XR cervical spine 12/30/2013:  Mild spondylosis at C5-6.  No acute osseous abnormality.  Lab Results  Component Value Date   TSH 0.86 09/24/2022   Lab Results  Component Value Date   VITAMINB12 431 04/10/2023      Thank you for allowing me to participate in patient's care.  If I can answer any additional questions, I would  be pleased to do so.    Sincerely,    Lorrene Graef K. Lydia Sams, DO

## 2023-05-28 ENCOUNTER — Ambulatory Visit: Payer: Self-pay | Admitting: Neurology

## 2023-08-09 ENCOUNTER — Other Ambulatory Visit: Payer: Self-pay | Admitting: Family

## 2023-08-13 NOTE — Patient Instructions (Incomplete)
 Allergic rhinitis Continue allergen avoidance measures directed toward grass pollen, weed pollen, tree pollen, dust mite, cat, and dog as listed below Continue levocetirizine 5 mg once a day if needed for runny nose or Itch continue to Ryaltris  2 sprays in each nostril up to twice a day if needed for stuffy nose Consider saline nasal rinses as needed for nasal symptoms. Use this before any medicated nasal sprays for best result Consider allergen immunotherapy if your symptoms are not well-controlled with the treatment plan as listed above  Stinging insect allergy  Continue to avoid stinging insects  In case of an allergic reaction, take Xyzal  5 mg once every 12-24 hours hours, and if life-threatening symptoms occur, inject with EpiPen  0.3 mg.  Dry skin Continue twice a day moisturizing routine Call the clinic if this treatment plan is not working well for you.  Follow up in *** or sooner if needed.  Reducing Pollen Exposure The American Academy of All[employer] , Asthma and Immunology suggests the following steps to reduce your exposure to pollen during allergy  seasons. Do not hang sheets or clothing out to dry; pollen may collect on these items. Do not mow lawns or spend time around freshly cut grass; mowing stirs up pollen. Keep windows closed at night.  Keep car windows closed while driving. Minimize morning activities outdoors, a time when pollen counts are usually at their highest. Stay indoors as much as possible when pollen counts or humidity is high and on windy days when pollen tends to remain in the air longer. Use air conditioning when possible.  Many air conditioners have filters that trap the pollen spores. Use a HEPA room air filter to remove pollen form the indoor air you breathe.   Control of Dust Mite Allergen Dust mites play a major role in allergic asthma and rhinitis. They occur in environments with high humidity wherever human skin is found. Dust mites absorb humidity from  the atmosphere (ie, they do not drink) and feed on organic matter (including shed human and animal skin). Dust mites are a microscopic type of insect that you cannot see with the naked eye. High levels of dust mites have been detected from mattresses, pillows, carpets, upholstered furniture, bed covers, clothes, soft toys and any woven material. The principal allergen of the dust mite is found in its feces. A gram of dust may contain 1,000 mites and 250,000 fecal particles. Mite antigen is easily measured in the air during house cleaning activities. Dust mites do not bite and do not cause harm to humans, other than by triggering allergies/asthma.  Ways to decrease your exposure to dust mites in your home:  1. Encase mattresses, box springs and pillows with a mite-impermeable barrier or cover  2. Wash sheets, blankets and drapes weekly in hot water  (130 F) with detergent and dry them in a dryer on the hot setting.  3. Have the room cleaned frequently with a vacuum cleaner and a damp dust-mop. For carpeting or rugs, vacuuming with a vacuum cleaner equipped with a high-efficiency particulate air (HEPA) filter. The dust mite allergic individual should not be in a room which is being cleaned and should wait 1 hour after cleaning before going into the room.  4. Do not sleep on upholstered furniture (eg, couches).  5. If possible removing carpeting, upholstered furniture and drapery from the home is ideal. Horizontal blinds should be eliminated in the rooms where the person spends the most time (bedroom, study, television room). Washable vinyl, roller-type shades are optimal.  6. Remove all non-washable stuffed toys from the bedroom. Wash stuffed toys weekly like sheets and blankets above.  7. Reduce indoor humidity to less than 50%. Inexpensive humidity monitors can be purchased at most hardware stores. Do not use a humidifier as can make the problem worse and are not recommended.  Control of Dog or Cat  Allergen Avoidance is the best way to manage a dog or cat allergy . If you have a dog or cat and are allergic to dog or cats, consider removing the dog or cat from the home. If you have a dog or cat but don't want to find it a new home, or if your family wants a pet even though someone in the household is allergic, here are some strategies that may help keep symptoms at bay:  Keep the pet out of your bedroom and restrict it to only a few rooms. Be advised that keeping the dog or cat in only one room will not limit the allergens to that room. Don't pet, hug or kiss the dog or cat; if you do, wash your hands with soap and water . High-efficiency particulate air (HEPA) cleaners run continuously in a bedroom or living room can reduce allergen levels over time. Regular use of a high-efficiency vacuum cleaner or a central vacuum can reduce allergen levels. Giving your dog or cat a bath at least once a week can reduce airborne allergen.

## 2023-08-13 NOTE — Progress Notes (Deleted)
   [address] [place] [address] Dept: [phone]  FOLLOW UP NOTE  Patient ID: Beth Wallace, female    DOB: 05-19-1965  Age: 58 y.o. MRN: [id number] Date of Office Visit: 08/14/2023  Assessment  Chief Complaint: No chief complaint on file.  HPI Beth Wallace is a 58 year old female who presents to the clinic for follow-up visit.  She was last seen in this clinic on 10/17/2022 by Dr. Iva for evaluation of allergic rhinitis, stinging insect allergy , and dry skin.  Her last environmental allergy  skin testing on 10/20/2021 was positive to grass pollen, weed pollen, tree pollen, dust mite, cat, and dog.  She has not yet gotten the labs for hymenoptera allergy  evaluation.  Discussed the use of AI scribe software for clinical note transcription with the patient, who gave verbal consent to proceed.  History of Present Illness      Drug Allergies:  Allergies  Allergen Reactions   Bee Venom Shortness Of Breath   Contrast Media [Iodinated Contrast Media] Anaphylaxis    CT dye ivp Throat swelling   Other     Physical Exam: There were no vitals taken for this visit.   Physical Exam  Diagnostics:    Assessment and Plan: No diagnosis found.  No orders of the defined types were placed in this encounter.   There are no Patient Instructions on file for this visit.  No follow-ups on file.    Thank you for the opportunity to care for this patient.  Please do not hesitate to contact me with questions.  Arlean Mutter, FNP [facility]

## 2023-08-14 ENCOUNTER — Encounter: Payer: BC Managed Care – PPO | Admitting: Internal Medicine

## 2023-08-14 ENCOUNTER — Ambulatory Visit: Admitting: Family Medicine

## 2023-08-17 ENCOUNTER — Other Ambulatory Visit: Payer: Self-pay | Admitting: Internal Medicine

## 2023-08-28 ENCOUNTER — Encounter: Payer: Self-pay | Admitting: Allergy & Immunology

## 2023-08-28 ENCOUNTER — Ambulatory Visit: Admitting: Allergy & Immunology

## 2023-08-28 ENCOUNTER — Other Ambulatory Visit: Payer: Self-pay

## 2023-08-28 VITALS — BP 140/80 | HR 91 | Temp 97.3°F | Resp 18 | Ht 66.93 in | Wt 263.4 lb

## 2023-08-28 DIAGNOSIS — L853 Xerosis cutis: Secondary | ICD-10-CM | POA: Diagnosis not present

## 2023-08-28 DIAGNOSIS — T6391XD Toxic effect of contact with unspecified venomous animal, accidental (unintentional), subsequent encounter: Secondary | ICD-10-CM

## 2023-08-28 DIAGNOSIS — L299 Pruritus, unspecified: Secondary | ICD-10-CM | POA: Diagnosis not present

## 2023-08-28 DIAGNOSIS — J3089 Other allergic rhinitis: Secondary | ICD-10-CM

## 2023-08-28 DIAGNOSIS — J302 Other seasonal allergic rhinitis: Secondary | ICD-10-CM

## 2023-08-28 MED ORDER — HYDROXYZINE HCL 25 MG PO TABS: 25.0000 mg | ORAL_TABLET | Freq: Three times a day (TID) | ORAL | 1 refills | Status: DC | PRN

## 2023-08-28 NOTE — Patient Instructions (Addendum)
 1. Chronic rhinitis - Previous testing showed: grasses, weeds, trees, dust mites, cat, and dog. - Continue taking: Allegra in the morning and Zyrtec in the evening  - Continue taking: Ryaltris  (olopatadine/mometasone) two sprays per nostril 1-2 times daily as needed - You can use an extra dose of the antihistamine, if needed, for breakthrough symptoms.  - Consider nasal saline rinses 1-2 times daily to remove allergens from the nasal cavities as well as help with mucous clearance (this is especially helpful to do before the nasal sprays are given) - We will get allergy  shots started. [facility] immunotherapy information and consent signed. - This will take off around 20 weekly visits. - You will still come 1-2 times per weeks to build up to the top dose and then it will pace out to every two weeks and then every four weeks.  - We will plan to do this in September.   2. Insect sting allergy  - Labs ordered today.  [facility] Givens refilled today.  3. Dry skin - Continue with moisturizing as you are doing.  - Your skin looks great!  - Allegra crosses the LEAST into the brain, so it causes the least amount of sleepiness.   4. Return in about 6 months (around 02/28/2024). You can have the follow up appointment with Dr. Iva or a Nurse Practicioner (our Nurse Practitioners are excellent and always have Physician oversight!).    Please inform us  of any Emergency Department visits, hospitalizations, or changes in symptoms. Call us  before going to the ED for breathing or allergy  symptoms since we might be able to fit you in for a sick visit. Feel free to contact us  anytime with any questions, problems, or concerns.  It was a pleasure to see you again today!  Websites that have reliable patient information: 1. American Academy of Asthma, Allergy , and Immunology: www.aaaai.org 2. Food Allergy  Research and Education (FARE): foodallergy.org 3. Mothers of Asthmatics: http://www.asthmacommunitynetwork.org 4.  [employer] of Allergy , Asthma, and Immunology: www.acaai.org      "Like" us  on Facebook and Instagram for our latest updates!      A healthy democracy works best when [employer] participate! Make sure you are registered to vote! If you have moved or changed any of your contact information, you will need to get this updated before voting! Scan the QR codes below to learn more!       Allergy  Shots  Allergies are the result of a chain reaction that starts in the immune system. Your immune system controls how your body defends itself. For instance, if you have an allergy  to pollen, your immune system identifies pollen as an invader or allergen. Your immune system overreacts by producing antibodies called Immunoglobulin E (IgE). These antibodies travel to cells that release chemicals, causing an allergic reaction.  The concept behind allergy  immunotherapy, whether it is received in the form of shots or tablets, is that the immune system can be desensitized to specific allergens that trigger allergy  symptoms. Although it requires time and patience, the payback can be long-term relief. Allergy  injections contain a dilute solution of those substances that you are allergic to based upon your skin testing and allergy  history.   How Do Allergy  Shots Work?  Allergy  shots work much like a vaccine. Your body responds to injected amounts of a particular allergen given in increasing doses, eventually developing a resistance and tolerance to it. Allergy  shots can lead to decreased, minimal or no allergy  symptoms.  There generally are two phases:  build-up and maintenance. Build-up often ranges from three to six months and involves receiving injections with increasing amounts of the allergens. The shots are typically given once or twice a week, though more rapid build-up schedules are sometimes used.  The maintenance phase begins when the most effective dose is reached. This dose is different for each  person, depending on how allergic you are and your response to the build-up injections. Once the maintenance dose is reached, there are longer periods between injections, typically two to four weeks.  Occasionally doctors give cortisone-type shots that can temporarily reduce allergy  symptoms. These types of shots are different and should not be confused with allergy  immunotherapy shots.  Who Can Be Treated with Allergy  Shots?  Allergy  shots may be a good treatment approach for people with allergic rhinitis (hay fever), allergic asthma, conjunctivitis (eye allergy ) or stinging insect allergy .   Before deciding to begin allergy  shots, you should consider:   The length of allergy  season and the severity of your symptoms  Whether medications and/or changes to your environment can control your symptoms  Your desire to avoid long-term medication use  Time: allergy  immunotherapy requires a major time commitment  Cost: may vary depending on your insurance coverage  Allergy  shots for children age 45 and older are effective and often well tolerated. They might prevent the onset of new allergen sensitivities or the progression to asthma.  Allergy  shots are not started on patients who are pregnant but can be continued on patients who become pregnant while receiving them. In some patients with other medical conditions or who take certain common medications, allergy  shots may be of risk. It is important to mention other medications you talk to your allergist.   What are the two types of build-ups offered:   RUSH or Rapid Desensitization -- one day of injections lasting from 8:30-4:30pm, injections every 1 hour.  Approximately half of the build-up process is completed in that one day.  The following week, normal build-up is resumed, and this entails ~16 visits either weekly or twice weekly, until reaching your "maintenance dose" which is continued weekly until eventually getting spaced out to every month  for a duration of 3 to 5 years. The regular build-up appointments are nurse visits where the injections are administered, followed by required monitoring for 30 minutes.    Traditional build-up -- weekly visits for 6 -12 months until reaching "maintenance dose", then continue weekly until eventually spacing out to every 4 weeks as above. At these appointments, the injections are administered, followed by required monitoring for 30 minutes.     Either way is acceptable, and both are equally effective. With the rush protocol, the advantage is that less time is spent here for injections overall AND you would also reach maintenance dosing faster (which is when the clinical benefit starts to become more apparent). Not everyone is a candidate for rapid desensitization.   IF we proceed with the RUSH protocol, there are premedications which must be taken the day before and the day after the rush only (this includes antihistamines, steroids, and Singulair).  After the rush day, no prednisone  or Singulair is required, and we just recommend antihistamines taken on your injection day.  What Is An Estimate of the Costs?  If you are interested in starting allergy  injections, please check with your insurance company about your coverage for both allergy  vial sets and allergy  injections.  Please do so prior to making the appointment to start injections.  The following are  CPT codes to give to your insurance company. These are the amounts we BILL to the insurance company, but the amount YOU WILL PAY and WE RECEIVE IS SUBSTANTIALLY LESS and depends on the contracts we have with different insurance companies.   Amount Billed to Insurance One allergy  vial set  CPT 95165   $ 1200     Two allergy  vial set  CPT 95165   $ 2400     Three allergy  vial set  CPT 95165   $ 3600     One injection   CPT 95115   $ 35  Two injections   CPT 95117   $ 40 RUSH (Rapid Desensitization) CPT 95180 x 8 hours $500/hour  Regarding the  allergy  injections, your co-pay may or may not apply with each injection, so please confirm this with your insurance company. When you start allergy  injections, 1 or 2 sets of vials are made based on your allergies.  Not all patients can be on one set of vials. A set of vials lasts 6 months to a year depending on how quickly you can proceed with your build-up of your allergy  injections. Vials are personalized for each patient depending on their specific allergens.  How often are allergy  injection given during the build-up period?   Injections are given at least weekly during the build-up period until your maintenance dose is achieved. Per the doctor's discretion, you may have the option of getting allergy  injections two times per week during the build-up period. However, there must be at least 48 hours between injections. The build-up period is usually completed within 6-12 months depending on your ability to schedule injections and for adjustments for reactions. When maintenance dose is reached, your injection schedule is gradually changed to every two weeks and later to every three weeks. Injections will then continue every 4 weeks. Usually, injections are continued for a total of 3-5 years.   When Will I Feel Better?  Some may experience decreased allergy  symptoms during the build-up phase. For others, it may take as long as 12 months on the maintenance dose. If there is no improvement after a year of maintenance, your allergist will discuss other treatment options with you.  If you aren't responding to allergy  shots, it may be because there is not enough dose of the allergen in your vaccine or there are missing allergens that were not identified during your allergy  testing. Other reasons could be that there are high levels of the allergen in your environment or major exposure to non-allergic triggers like tobacco smoke.  What Is the Length of Treatment?  Once the maintenance dose is reached,  allergy  shots are generally continued for three to five years. The decision to stop should be discussed with your allergist at that time. Some people may experience a permanent reduction of allergy  symptoms. Others may relapse and a longer course of allergy  shots can be considered.  What Are the Possible Reactions?  The two types of adverse reactions that can occur with allergy  shots are local and systemic. Common local reactions include very mild redness and swelling at the injection site, which can happen immediately or several hours after. Report a delayed reaction from your last injection. These include arm swelling or runny nose, watery eyes or cough that occurs within 12-24 hours after injection. A systemic reaction, which is less common, affects the entire body or a particular body system. They are usually mild and typically respond quickly to medications. Signs include increased allergy   symptoms such as sneezing, a stuffy nose or hives.   Rarely, a serious systemic reaction called anaphylaxis can develop. Symptoms include swelling in the throat, wheezing, a feeling of tightness in the chest, nausea or dizziness. Most serious systemic reactions develop within 30 minutes of allergy  shots. This is why it is strongly recommended you wait in your doctor's office for 30 minutes after your injections. Your allergist is trained to watch for reactions, and his or her staff is trained and equipped with the proper medications to identify and treat them.   Report to the nurse immediately if you experience any of the following symptoms: swelling, itching or redness of the skin, hives, watery eyes/nose, breathing difficulty, excessive sneezing, coughing, stomach pain, diarrhea, or light headedness. These symptoms may occur within 15-20 minutes after injection and may require medication.   Who Should Administer Allergy  Shots?  The preferred location for receiving shots is your prescribing allergist's office.  Injections can sometimes be given at another facility where the physician and staff are trained to recognize and treat reactions, and have received instructions by your prescribing allergist.  What if I am late for an injection?   Injection dose will be adjusted depending upon how many days or weeks you are late for your injection.   What if I am sick?   Please report any illness to the nurse before receiving injections. She may adjust your dose or postpone injections depending on your symptoms. If you have fever, flu, sinus infection or chest congestion it is best to postpone allergy  injections until you are better. Never get an allergy  injection if your asthma is causing you problems. If your symptoms persist, seek out medical care to get your health problem under control.  What If I am or Become Pregnant:  Women that become pregnant should schedule an appointment with [facility] before receiving any further allergy  injections.

## 2023-08-28 NOTE — Progress Notes (Signed)
 FOLLOW UP  Date of Service/Encounter:  08/28/23   Assessment:   Seasonal and perennial allergic rhinitis (grasses, weeds, trees, dust mites, cat, and dog)   Insect sting allergy  - getting blood work today (EpiPen  updated)   Dry skin   Social stressors - recent death of husband in 09/10/22 Plan/Recommendations:   1. Chronic rhinitis - Previous testing showed: grasses, weeds, trees, dust mites, cat, and dog. - Continue taking: Allegra in the morning and Zyrtec in the evening  - Continue taking: Ryaltris  (olopatadine/mometasone) two sprays per nostril 1-2 times daily as needed - You can use an extra dose of the antihistamine, if needed, for breakthrough symptoms.  - Consider nasal saline rinses 1-2 times daily to remove allergens from the nasal cavities as well as help with mucous clearance (this is especially helpful to do before the nasal sprays are given) - We will get allergy  shots started. [facility] immunotherapy information and consent signed. - This will take off around 20 weekly visits. - You will still come 1-2 times per weeks to build up to the top dose and then it will pace out to every two weeks and then every four weeks.  - We will plan to do this in September.   2. Insect sting allergy  - Labs ordered today.  [facility] Givens refilled today.  3. Dry skin - Continue with moisturizing as you are doing.  - Your skin looks great!  - Allegra crosses the LEAST into the brain, so it causes the least amount of sleepiness.   4. Return in about 6 months (around 02/28/2024). You can have the follow up appointment with Dr. Iva or a Nurse Practicioner (our Nurse Practitioners are excellent and always have Physician oversight!).    Subjective:   Zuleyka Miller-Womack is a 58 y.o. female presenting today for follow up of  Chief Complaint  Patient presents with   Other    Allergy  shot, little pressure in nose, environmental itching    Forrestine Miller-Womack has a history  of the following: Patient Active Problem List   Diagnosis Date Noted   Seasonal and perennial allergic rhinitis 10/22/2021   Insect sting allergy , current reaction, accidental or unintentional, subsequent encounter 10/22/2021   Dry skin 10/22/2021   Type 2 diabetes mellitus with hyperglycemia (HCC) 07/25/2021   BMI 40.0-44.9, adult (HCC) 04/20/2016   Cervicogenic headache 04/15/2015   Vitamin D  deficiency 11/19/2014   Essential hypertension 11/19/2014   Hyperglycemia 09/08/2013   Low HDL (under 40) 04/08/2013   Visit for preventive health examination 04/08/2013   Severe obesity (BMI >= 40) (HCC) 04/08/2013   Achilles tendinitis 10/08/2012   Back pain 07/08/2012   Sciatica neuralgia 07/08/2012   Stress 01/23/2012   H/O bee sting allergy  05/18/2011   GERD (gastroesophageal reflux disease) 04/12/2011   Dysphagia 04/12/2011   OBESITY 01/31/2010   Essential hypertension 12/14/2009   BACK PAIN WITH RADICULOPATHY 12/14/2009    History obtained from: chart review and patient.  Discussed the use of AI scribe software for clinical note transcription with the patient and/or guardian, who gave verbal consent to proceed.  Arieliz is a 58 y.o. female presenting for a follow up visit. a 58 year old female who presents to the clinic for follow-up visit.  She was last seen in this clinic on 10/17/2022 for evaluation of allergic rhinitis, stinging insect allergy , and dry skin.  Her last environmental allergy  skin testing on 10/20/2021 was positive to grass pollen, weed pollen, tree pollen, dust mite, cat, and  dog.  She has not yet gotten the labs for hymenoptera allergy  evaluation.  At the last visit, we continued with Xyzal  and started Ryaltris  1 to 2 sprays per nostril up to twice daily.  Since the last visit, she has been a bit better.  She lives alone with her dog, Elora, but her brothers have been staying with her temporarily. 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 She continues to be interested in opening up an event venue in [place]. Her husband's anniversary of his passing is coming up this Saturday; it has been nearly a year at this point.   Allergic Rhinitis Symptom History: Her symptoms appear to be influenced by environmental factors, as they improved while she was in [place]  but returned upon her return. She notes that her symptoms began in March, around the time when pollen levels were high, and suspects environmental factors may be involved. She remains interested in allergy  shots as a means of controlling her PND and her pruritus. She would like to do rush immunotherapy.  Skin Symptom History: She has been experiencing intermittent itching since moving from [place] , which she attributes to seasonal allergies. The itching is episodic, lasting for months before subsiding. She uses hydroxyzine  sparingly due to its sedative effects and finds Allegra in the morning and Zyrtec in the evening effective. No rashes accompany the itching. She has a history of dry skin, managed with CeraVe and Avapro moisturizers. However, she dislikes the feeling of moisturizers after showering, affecting her consistency in using them. No rashes associated with her itching. She reports dry skin and occasional pressure in her mouth.   Otherwise, there have been no changes to her past medical history, surgical history, family history, or social history.    Review of systems otherwise negative other than that mentioned in the HPI.    Objective:   Blood pressure (!) 140/80, pulse 91, temperature (!) 97.3 F (36.3 C), temperature source Temporal, resp. rate 18, height 5' 6.93 (1.7 m), weight 263 lb 6.4 oz (119.5 kg), SpO2 98%. Body mass index is 41.34 kg/m.    Physical Exam Vitals reviewed.  Constitutional:      Appearance: She is well-developed.     Comments: Tearful.   HENT:     Head: Normocephalic and atraumatic.     Right Ear: Tympanic membrane,  ear canal and external ear normal. No drainage, swelling or tenderness. Tympanic membrane is not injected, scarred, erythematous, retracted or bulging.     Left Ear: Tympanic membrane, ear canal and external ear normal. No drainage, swelling or tenderness. Tympanic membrane is not injected, scarred, erythematous, retracted or bulging.     Nose: Mucosal edema and rhinorrhea present. No nasal deformity or septal deviation.     Right Turbinates: Enlarged, swollen and pale.     Left Turbinates: Enlarged, swollen and pale.     Right Sinus: No maxillary sinus tenderness or frontal sinus tenderness.     Left Sinus: No maxillary sinus tenderness or frontal sinus tenderness.     Comments: No polyps noted.     Mouth/Throat:     Lips: Pink.     Mouth: Mucous membranes are moist. Mucous membranes are not pale and not dry.     Pharynx: Uvula midline.     Comments: Mild to moderate cobblestoning noted.  Eyes:     General:        Right eye: No discharge.  Left eye: No discharge.     Conjunctiva/sclera: Conjunctivae normal.     Right eye: Right conjunctiva is not injected. No chemosis.    Left eye: Left conjunctiva is not injected. No chemosis.    Pupils: Pupils are equal, round, and reactive to light.  Cardiovascular:     Rate and Rhythm: Normal rate and regular rhythm.     Heart sounds: Normal heart sounds.  Pulmonary:     Effort: Pulmonary effort is normal. No tachypnea, accessory muscle usage or respiratory distress.     Breath sounds: Normal breath sounds. No wheezing, rhonchi or rales.     Comments: Moving air well in all lung fields. No increased work of breathing noted.  Chest:     Chest wall: No tenderness.  Abdominal:     Tenderness: There is no abdominal tenderness. There is no guarding or rebound.  Lymphadenopathy:     Head:     Right side of head: No submandibular, tonsillar or occipital adenopathy.     Left side of head: No submandibular, tonsillar or occipital adenopathy.      Cervical: No cervical adenopathy.  Skin:    General: Skin is warm.     Capillary Refill: Capillary refill takes less than 2 seconds.     Coloration: Skin is not pale.     Findings: No abrasion, erythema, petechiae or rash. Rash is not papular, urticarial or vesicular.     Comments: No eczematous patches noted.   Neurological:     Mental Status: She is alert.  Psychiatric:        Behavior: Behavior is cooperative.      Diagnostic studies: none      Marty Shaggy, MD  [facility] 

## 2023-09-15 ENCOUNTER — Other Ambulatory Visit: Payer: Self-pay | Admitting: Family

## 2023-09-19 ENCOUNTER — Telehealth: Payer: Self-pay | Admitting: *Deleted

## 2023-09-19 ENCOUNTER — Other Ambulatory Visit: Payer: Self-pay | Admitting: Internal Medicine

## 2023-09-19 MED ORDER — LISINOPRIL-HYDROCHLOROTHIAZIDE 20-25 MG PO TABS
1.0000 | ORAL_TABLET | Freq: Every day | ORAL | 3 refills | Status: AC
Start: 2023-09-19 — End: ?

## 2023-09-19 NOTE — Telephone Encounter (Signed)
 Attempted to reach pt. Left a detail message that Rx is sent and to call us  back if has any questions.

## 2023-09-19 NOTE — Telephone Encounter (Signed)
 Copied from CRM [id number]. Topic: Clinical - Prescription Issue >> Sep 19, 2023  1:56 PM [place] J wrote: Reason for CRM: The patient's pharmacy sent out a request for lisinopril -hydrochlorothiazide  (ZESTORETIC ) 20-25 MG tablet on 08/31. Since it is past out 3-day turn around time, the patient is wanting this medication sent in as soon as possible.  Please call patient with an update.

## 2023-09-19 NOTE — Telephone Encounter (Unsigned)
 Copied from CRM [id number]. Topic: Clinical - Medication Refill >> Sep 19, 2023  1:54 PM [place] J wrote: Medication: potassium chloride  SA (KLOR-CON  M) 20 MEQ tablet  Has the patient contacted their pharmacy? Yes (Agent: If no, request that the patient contact the pharmacy for the refill. If patient does not wish to contact the pharmacy document the reason why and proceed with request.) (Agent: If yes, when and what did the pharmacy advise?) Pharmacy said they have sent out the request a week ago. This is the patient's preferred pharmacy:  [id number] [place], [place] - [address] AT [facility] [place] [address] [place] [address] Phone: [phone] Fax: [phone]  Is this the correct pharmacy for this prescription? Yes If no, delete pharmacy and type the correct one.   Has the prescription been filled recently? No  Is the patient out of the medication? Yes  Has the patient been seen for an appointment in the last year OR does the patient have an upcoming appointment? Yes  Can we respond through MyChart? Yes  Agent: Please be advised that Rx refills may take up to 3 business days. We ask that you follow-up with your pharmacy.

## 2023-09-20 MED ORDER — POTASSIUM CHLORIDE CRYS ER 20 MEQ PO TBCR: EXTENDED_RELEASE_TABLET | ORAL | 1 refills | Status: DC

## 2023-09-23 ENCOUNTER — Other Ambulatory Visit: Payer: Self-pay | Admitting: Allergy & Immunology

## 2023-10-10 ENCOUNTER — Encounter: Admitting: Internal Medicine

## 2023-10-14 ENCOUNTER — Other Ambulatory Visit: Payer: Self-pay | Admitting: Internal Medicine

## 2023-11-21 ENCOUNTER — Encounter: Admitting: Internal Medicine

## 2023-12-03 ENCOUNTER — Other Ambulatory Visit: Payer: Self-pay | Admitting: Internal Medicine

## 2023-12-16 ENCOUNTER — Encounter: Payer: Self-pay | Admitting: Family Medicine

## 2023-12-16 ENCOUNTER — Other Ambulatory Visit: Payer: Self-pay

## 2023-12-16 ENCOUNTER — Ambulatory Visit: Admitting: Family Medicine

## 2023-12-16 VITALS — BP 110/72 | HR 77 | Ht 67.0 in | Wt 275.0 lb

## 2023-12-16 DIAGNOSIS — M25521 Pain in right elbow: Secondary | ICD-10-CM

## 2023-12-16 MED ORDER — TRAMADOL HCL 50 MG PO TABS: 50.0000 mg | ORAL_TABLET | Freq: Three times a day (TID) | ORAL | 0 refills | Status: AC | PRN

## 2023-12-16 NOTE — Progress Notes (Signed)
 I, Leotis Batter, CMA acting as a scribe for Artist Lloyd, MD.  Beth Wallace is a 58 y.o. female who presents to [facility] at [facility] today for R elbow pain. Pt was previously seen by Dr. Lloyd on 07/17/22 for R knee pain.  Today, pt c/o intermittent R elbow pain x 1 year. Pt locates pain to lateral aspect of the elbow. Sx worse with lifting. Was seen at [facility] 11/17 and dx with Tendonitis, XR was done, and patient was referral to ortho. Pt is RHD. Sx exacerbation x 1 month  Radiates: into the lower arm Paresthesia: burning sensation Grip strength: decreased Aggravates: lifting Treatments tried: heat, ice, IBU, Tylenol , IM inj, ACE wrap  Pertinent review of systems: No fevers or chills  Relevant historical information: Controlled diabetes   Exam:  BP 110/72   Pulse 77   Ht 5' 7 (1.702 m)   Wt 275 lb (124.7 kg)   SpO2 100%   BMI 43.07 kg/m  General: Well Developed, well nourished, and in no acute distress.   MSK: Right elbow normal-appearing tender palpation lateral epicondyle.  Reduced range of motion lacks full extension.  Pain with resisted wrist and finger extension.  Elbow strength is intact.    Lab and Radiology Results  Procedure: Real-time Ultrasound Guided Injection of right elbow lateral epicondyle Device: Philips Affiniti 50G/GE Logiq Images permanently stored and available for review in PACS Verbal informed consent obtained.  Discussed risks and benefits of procedure. Warned about infection, bleeding, hyperglycemia damage to structures among others. Patient expresses understanding and agreement Time-out conducted.   Noted no overlying erythema, induration, or other signs of local infection.   Skin prepped in a sterile fashion.   Local anesthesia: Topical Ethyl chloride.   With sterile technique and under real time ultrasound guidance: 40 mg of Kenalog and 1 mL of Marcaine  injected into lateral epicondyle. Fluid seen entering the  epicondyle.   Completed without difficulty   Pain immediately resolved suggesting accurate placement of the medication.   Advised to call if fevers/chills, erythema, induration, drainage, or persistent bleeding.   Images permanently stored and available for review in the ultrasound unit.  Impression: Technically successful ultrasound guided injection.      Treatment Plan XR Elbow Complete 3+ Views Right     MEDICAL RECORDS NUMBER: [id number] CMGXR Elbow Complete 3+ Views Right: 4 VIEWS   HISTORY: Pain (please specify)-Pain over right lateral epicondyle-no trauma   COMPARISON: None   FINDINGS:   No acute fracture or dislocation. No joint effusion. Mild olecranon soft tissue swelling. Multifocal enthesopathy, including the medial and lateral epicondyles. No radiopaque foreign body.   IMPRESSION: 1.No acute fracture or dislocation.   2.Mild olecranon soft tissue swelling.   Signed By: Albina, MD, Samir D    Assessment and Plan: 58 y.o. female with right lateral elbow pain due to lateral epicondylitis.  This is ongoing intermittently for the last year worse recently.  She has had x-rays as above earlier in November that did not show severe abnormality.  Plan for injection and home exercise program.  Tramadol  prescribed as well.  If needed she prefers [facility] in [place] .  Check back if not improved.   PDMP reviewed during this encounter. Orders Placed This Encounter  Procedures   US  LIMITED JOINT SPACE STRUCTURES UP RIGHT(NO LINKED CHARGES)    Reason for Exam (SYMPTOM  OR DIAGNOSIS REQUIRED):   right elbow pain    Preferred imaging location?:   [facility] ordered this encounter  Medications   traMADol  (ULTRAM ) 50 MG tablet    Sig: Take 1 tablet (50 mg total) by mouth every 8 (eight) hours as needed for severe pain (pain score 7-10).    Dispense:  10 tablet    Refill:  0     Discussed warning signs or  symptoms. Please see discharge instructions. Patient expresses understanding.   The above documentation has been reviewed and is accurate and complete Artist Lloyd, M.D.

## 2023-12-16 NOTE — Patient Instructions (Addendum)
 Thank you for coming in today.   Please work on the home exercises the athletic trainer went over with you: View at [email or web address] code [phone]  You received an injection today. Seek immediate medical attention if the joint becomes red, extremely painful, or is oozing fluid.   Let us  know if you want to be referral to Occupational Therapy.   See you back as needed.

## 2024-01-02 ENCOUNTER — Encounter: Payer: Self-pay | Admitting: Internal Medicine

## 2024-01-02 ENCOUNTER — Ambulatory Visit (INDEPENDENT_AMBULATORY_CARE_PROVIDER_SITE_OTHER): Admitting: Internal Medicine

## 2024-01-02 VITALS — BP 122/80 | HR 78 | Temp 98.2°F | Ht 67.0 in | Wt 258.0 lb

## 2024-01-02 DIAGNOSIS — E785 Hyperlipidemia, unspecified: Secondary | ICD-10-CM | POA: Diagnosis not present

## 2024-01-02 DIAGNOSIS — Z23 Encounter for immunization: Secondary | ICD-10-CM

## 2024-01-02 DIAGNOSIS — I10 Essential (primary) hypertension: Secondary | ICD-10-CM

## 2024-01-02 DIAGNOSIS — Z7985 Long-term (current) use of injectable non-insulin antidiabetic drugs: Secondary | ICD-10-CM

## 2024-01-02 DIAGNOSIS — Z79899 Other long term (current) drug therapy: Secondary | ICD-10-CM | POA: Diagnosis not present

## 2024-01-02 DIAGNOSIS — Z0000 Encounter for general adult medical examination without abnormal findings: Secondary | ICD-10-CM

## 2024-01-02 DIAGNOSIS — E1165 Type 2 diabetes mellitus with hyperglycemia: Secondary | ICD-10-CM

## 2024-01-02 DIAGNOSIS — E876 Hypokalemia: Secondary | ICD-10-CM

## 2024-01-02 LAB — CBC WITH DIFFERENTIAL/PLATELET
Basophils Absolute: 0 K/uL (ref 0.0–0.1)
Basophils Relative: 0.5 % (ref 0.0–3.0)
Eosinophils Absolute: 0 K/uL (ref 0.0–0.7)
Eosinophils Relative: 0.7 % (ref 0.0–5.0)
HCT: 45.7 % (ref 36.0–46.0)
Hemoglobin: 15.6 g/dL — ABNORMAL HIGH (ref 12.0–15.0)
Lymphocytes Relative: 37 % (ref 12.0–46.0)
Lymphs Abs: 2.2 K/uL (ref 0.7–4.0)
MCHC: 34.1 g/dL (ref 30.0–36.0)
MCV: 86.9 fl (ref 78.0–100.0)
Monocytes Absolute: 0.4 K/uL (ref 0.1–1.0)
Monocytes Relative: 6.9 % (ref 3.0–12.0)
Neutro Abs: 3.3 K/uL (ref 1.4–7.7)
Neutrophils Relative %: 54.9 % (ref 43.0–77.0)
Platelets: 405 K/uL — ABNORMAL HIGH (ref 150.0–400.0)
RBC: 5.26 Mil/uL — ABNORMAL HIGH (ref 3.87–5.11)
RDW: 14.5 % (ref 11.5–15.5)
WBC: 6 K/uL (ref 4.0–10.5)

## 2024-01-02 LAB — MICROALBUMIN / CREATININE URINE RATIO
Creatinine,U: 218.4 mg/dL
Microalb Creat Ratio: 16 mg/g (ref 0.0–30.0)
Microalb, Ur: 3.5 mg/dL — ABNORMAL HIGH (ref 0.7–1.9)

## 2024-01-02 LAB — COMPREHENSIVE METABOLIC PANEL WITH GFR
ALT: 19 U/L (ref 3–35)
AST: 20 U/L (ref 5–37)
Albumin: 4.9 g/dL (ref 3.5–5.2)
Alkaline Phosphatase: 83 U/L (ref 39–117)
BUN: 15 mg/dL (ref 6–23)
CO2: 31 meq/L (ref 19–32)
Calcium: 10.1 mg/dL (ref 8.4–10.5)
Chloride: 97 meq/L (ref 96–112)
Creatinine, Ser: 0.9 mg/dL (ref 0.40–1.20)
GFR: 71.49 mL/min (ref >60.00)
Glucose, Bld: 107 mg/dL — ABNORMAL HIGH (ref 70–99)
Potassium: 4.1 meq/L (ref 3.5–5.1)
Sodium: 136 meq/L (ref 135–145)
Total Bilirubin: 0.7 mg/dL (ref 0.2–1.2)
Total Protein: 8.5 g/dL — ABNORMAL HIGH (ref 6.0–8.3)

## 2024-01-02 LAB — LIPID PANEL
Cholesterol: 108 mg/dL (ref 28–200)
HDL: 39.7 mg/dL (ref >39.00)
LDL Cholesterol: 57 mg/dL (ref 10–99)
NonHDL: 68.73
Total CHOL/HDL Ratio: 3
Triglycerides: 59 mg/dL (ref 10.0–149.0)
VLDL: 11.8 mg/dL (ref 0.0–40.0)

## 2024-01-02 LAB — TSH: TSH: 0.34 u[IU]/mL — ABNORMAL LOW (ref 0.35–5.50)

## 2024-01-02 LAB — HEMOGLOBIN A1C: Hgb A1c MFr Bld: 6.4 % (ref 4.6–6.5)

## 2024-01-02 MED ORDER — METFORMIN HCL ER 500 MG PO TB24: 500.0000 mg | ORAL_TABLET | Freq: Every day | ORAL | 2 refills | Status: AC

## 2024-01-02 MED ORDER — ALPRAZOLAM 0.5 MG PO TABS: ORAL_TABLET | ORAL | 0 refills | Status: AC

## 2024-01-02 MED ORDER — IBUPROFEN 800 MG PO TABS: ORAL_TABLET | ORAL | 2 refills | Status: AC

## 2024-01-02 NOTE — Progress Notes (Unsigned)
 Chief Complaint  Patient presents with   Annual Exam    HPI: Patient  Beth Wallace  58 y.o. comes in today for Preventive Health Care visit  And Chronic disease management DM HT Obesity  Health Maintenance  Topic Date Due   FOOT EXAM  Never done   Hepatitis B Vaccines 19-59 Average Risk (1 of 3 - 19+ 3-dose series) Never done   HEMOGLOBIN A1C  08/12/2023   Influenza Vaccine  08/16/2023   OPHTHALMOLOGY EXAM  01/11/2024 (Originally 07/20/1975)   COVID-19 Vaccine (5 - 2025-26 season) 01/18/2024 (Originally 09/16/2023)   Cervical Cancer Screening (HPV/Pap Cotest)  04/01/2024 (Originally 09/08/2016)   Mammogram  07/02/2024 (Originally 04/06/2023)   Pneumococcal Vaccine: 50+ Years (1 of 2 - PCV) 01/01/2025 (Originally 07/21/1986)   Hepatitis C Screening  01/01/2025 (Originally 07/20/1985)   HIV Screening  01/01/2025 (Originally 07/21/1982)   Diabetic kidney evaluation - eGFR measurement  04/09/2024   Diabetic kidney evaluation - Urine ACR  04/09/2024   Colonoscopy  02/25/2028   DTaP/Tdap/Td (3 - Tdap) 08/30/2030   Zoster Vaccines- Shingrix   Completed   HPV VACCINES  Aged Out   Meningococcal B Vaccine  Aged Out   Health Maintenance Review LIFESTYLE:  Exercise:   Tobacco/ETS: Alcohol:  Sugar beverages: Sleep: Drug use: no HH of  Work:    ROS:  GEN/ HEENT: No fever, significant weight changes sweats headaches vision problems hearing changes, CV/ PULM; No chest pain shortness of breath cough, syncope,edema  change in exercise tolerance. GI /GU: No adominal pain, vomiting, change in bowel habits. No blood in the stool. No significant GU symptoms. SKIN/HEME: ,no acute skin rashes suspicious lesions or bleeding. No lymphadenopathy, nodules, masses.  NEURO/ PSYCH:  No neurologic signs such as weakness numbness. No depression anxiety. IMM/ Allergy : No unusual infections.  Allergy  .   REST of 12 system review negative except as per HPI   Past Medical History:  Diagnosis Date    ADJ DISORDER WITH MIXED ANXIETY & DEPRESSED MOOD 12/14/2009   Qualifier: Diagnosis of  By: Charlett MD, Apolinar POUR    Allergic reaction 05/18/2011   Allergy     SEASONAL    Anxiety    Diabetes mellitus without complication (HCC)    pre diabetic- not started Metformin  - diet and exercise to control    GERD (gastroesophageal reflux disease)    Heart murmur    slight years ago    History of gallstones    HTN (hypertension)    Metrorrhagia 12/14/2009   Qualifier: Diagnosis of  By: Charlett MD, Apolinar POUR With pelvic pain  Sees Dr Sarrah and tobotic hysterectomy scheduled    Neuromuscular disorder (HCC)    cervical issues to arms   Obesity    Rapid palpitations 01/23/2012   Probably related to stress had negative cardiac workup in the past rule out metabolic    TMJ (temporomandibular joint disorder)     Past Surgical History:  Procedure Laterality Date   ABDOMINAL HYSTERECTOMY  09/2010   partial   CESAREAN SECTION     CHOLECYSTECTOMY  1989   UPPER GASTROINTESTINAL ENDOSCOPY     WISDOM TOOTH EXTRACTION  2010    Family History  Problem Relation Age of Onset   Heart disease Mother 61       Died age 70, MI   Heart attack Mother    Hypertension Mother    Hypertension Father        Living   Diabetes Father    Heart disease  Father    Other Sister        Killed, age 52   Other Daughter        Killed, age 48   Colon polyps Maternal Aunt    Hyperlipidemia Neg Hx    Sudden death Neg Hx    Colon cancer Neg Hx    Esophageal cancer Neg Hx    Rectal cancer Neg Hx    Stomach cancer Neg Hx     Social History   Socioeconomic History   Marital status: Married    Spouse name: Not on file   Number of children: 1   Years of education: 16   Highest education level: GED or equivalent  Occupational History   Occupation: Event Organiser: [employer]  Tobacco Use   Smoking status: Some Days    Current packs/day: 0.25    Types: Cigarettes   Smokeless tobacco: Never   Tobacco comments:     Pt.smokes some days   Vaping Use   Vaping status: Never Used  Substance and Sexual Activity   Alcohol use: No    Alcohol/week: 0.0 standard drinks of alcohol   Drug use: No   Sexual activity: Not on file  Other Topics Concern   Not on file  Social History Narrative   Orig from [place]  and changes for job,     7-8 hours sleep per night     Born in [place], [place],     [employer] and [employer],     Geographical information systems officer,    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  On line now.,       Back in school gen ed   At [employer],      [facility] of 2,     BF and dog,     G3P1,     Bereaved parent daughter murdered 2008,   Trial finished  Roselie guilty this week march    No etoh,      No caffeine,     Minimal tobacco,       ETS     Got married feb 14    Right Handed   Social Drivers of Health   Tobacco Use: High Risk (01/02/2024)   Patient History    Smoking Tobacco Use: Some Days    Smokeless Tobacco Use: Never    Passive Exposure: Not on file  Financial Resource Strain: Patient Declined (12/29/2023)   Overall Financial Resource Strain (CARDIA)    Difficulty of Paying Living Expenses: Patient declined  Food Insecurity: Patient Declined (12/29/2023)   Epic    Worried About Programme Researcher, Broadcasting/film/video in the Last Year: Patient declined    Barista in the Last Year: Patient declined  Transportation Needs: No Transportation Needs (12/29/2023)   Epic    Lack of Transportation (Medical): No    Lack of Transportation (Non-Medical): No  Physical Activity: Insufficiently Active (12/29/2023)   Exercise Vital Sign    Days of Exercise per Week: 1 day    Minutes of Exercise per Session: 10 min  Stress: No Stress Concern Present (12/29/2023)   [employer] of Occupational Health - Occupational Stress Questionnaire    Feeling of Stress: Only a little  Social Connections: Moderately Isolated (12/29/2023)   Social Connection and Isolation Panel    Frequency of Communication with Friends and Family:  More than three times a week    Frequency of Social Gatherings with Friends and Family: Once a week  Attends Religious Services: 1 to 4 times per year    Active Member of Clubs or Organizations: No    Attends Banker Meetings: Not on file    Marital Status: Widowed  Depression (PHQ2-9): Low Risk (01/02/2024)   Depression (PHQ2-9)    PHQ-2 Score: 0  Alcohol Screen: Not on file  Housing: Unknown (12/29/2023)   Epic    Unable to Pay for Housing in the Last Year: Patient declined    Number of Times Moved in the Last Year: 0    Homeless in the Last Year: No  Utilities: Not on file  Health Literacy: Not on file    Outpatient Medications Prior to Visit  Medication Sig Dispense Refill   ALPRAZolam  (XANAX ) 0.5 MG tablet TAKE 1 TABLET BY MOUTH THREE TIMES A DAY AS NEEDED FOR ANXIETY 24 tablet 0   azelastine  (ASTELIN ) 0.1 % nasal spray Place 1 spray into both nostrils 2 (two) times daily as needed. Use in each nostril as directed 30 mL 5   Azelastine -Fluticasone  137-50 MCG/ACT SUSP      cetirizine (ZYRTEC) 10 MG tablet Take 10 mg by mouth as needed.     chlorhexidine (PERIDEX) 0.12 % solution 15 mLs as needed.     cyclobenzaprine  (FLEXERIL ) 5 MG tablet TAKE 1 TABLET (5 MG TOTAL) BY MOUTH AT BEDTIME AS NEEDED FOR MUSCLE SPASMS (NECK PAIN (SEVERE)). 30 tablet 5   EPINEPHrine  (AUVI-Q ) 0.3 mg/0.3 mL IJ SOAJ injection Inject 0.3 mg into the muscle as needed for anaphylaxis. 2 each 2   fexofenadine (ALLEGRA) 180 MG tablet Take 180 mg by mouth as needed.     fluticasone  (FLONASE ) 50 MCG/ACT nasal spray Place 2 sprays into both nostrils daily. 16 g 5   gabapentin  (NEURONTIN ) 300 MG capsule Take 1 capsule (300 mg total) by mouth at bedtime. 30 capsule 3   hydrOXYzine  (ATARAX ) 25 MG tablet TAKE 1 TABLET BY MOUTH THREE TIMES A DAY AS NEEDED 270 tablet 1   ibuprofen  (ADVIL ) 800 MG tablet TAKE 1 TABLET BY MOUTH EVERY 8 HOURS AS NEEDED FOR CRAMPS 30 tablet 2   lisinopril -hydrochlorothiazide   (ZESTORETIC ) 20-25 MG tablet Take 1 tablet by mouth daily. 90 tablet 3   metFORMIN  (GLUCOPHAGE -XR) 500 MG 24 hr tablet TAKE 1 TABLET BY MOUTH EVERY DAY WITH BREAKFAST 90 tablet 2   Olopatadine-Mometasone (RYALTRIS ) 665-25 MCG/ACT SUSP Place 2 sprays into the nose 2 (two) times daily as needed. 29 g 5   omeprazole  (PRILOSEC) 20 MG capsule TAKE 1 CAPSULE (20 MG TOTAL) BY MOUTH 2 (TWO) TIMES DAILY BEFORE A MEAL. AS DIRECTED 180 capsule 1   potassium chloride  SA (KLOR-CON  M) 20 MEQ tablet TAKE 2 PER DAY FOR 5 DAYS THEN 1 BY MOUTH EVERY DAY. 360 tablet 1   rosuvastatin  (CRESTOR ) 10 MG tablet TAKE 1 TABLET BY MOUTH EVERY DAY 90 tablet 2   tirzepatide  (MOUNJARO ) 5 MG/0.5ML Pen Inject 5 mg into the skin once a week. 2 mL 1   tirzepatide  (MOUNJARO ) 7.5 MG/0.5ML Pen INJECT 7.5 MG INTO THE SKIN ONCE A WEEK. DOSAGE CHANGE 2 mL 2   tiZANidine  (ZANAFLEX ) 2 MG tablet TAKE 1 TABLET BY MOUTH AT BEDTIME AS NEEDED FOR MUSCLE SPASMS. 90 tablet 2   traMADol  (ULTRAM ) 50 MG tablet Take 1 tablet (50 mg total) by mouth every 8 (eight) hours as needed for severe pain (pain score 7-10). 10 tablet 0   hydrocortisone  2.5 % cream Apply twice daily for flare ups, maximum 7 days. (Patient  not taking: Reported on 01/02/2024) 30 g 0   levocetirizine (XYZAL ) 5 MG tablet Take 1 tablet (5 mg total) by mouth as needed for allergies. (Patient not taking: Reported on 01/02/2024) 90 tablet 1   No facility-administered medications prior to visit.     EXAM:  BP 122/80 (BP Location: Left Arm, Patient Position: Sitting, Cuff Size: Large)   Pulse 78   Temp 98.2 F (36.8 C) (Oral)   Ht 5' 7 (1.702 m)   Wt 258 lb (117 kg)   SpO2 98%   BMI 40.41 kg/m   Body mass index is 40.41 kg/m. Wt Readings from Last 3 Encounters:  01/02/24 258 lb (117 kg)  12/16/23 275 lb (124.7 kg)  08/28/23 263 lb 6.4 oz (119.5 kg)    Physical Exam: Vital signs reviewed HZW:Uypd is a well-developed well-nourished alert cooperative    who appearsr  stated age in no acute distress.  HEENT: normocephalic atraumatic , Eyes: PERRL EOM's full, conjunctiva clear, Nares: paten,t no deformity discharge or tenderness., Ears: no deformity EAC's clear TMs with normal landmarks. Mouth: clear OP, no lesions, edema.  Moist mucous membranes. Dentition in adequate repair. NECK: supple without masses, thyromegaly or bruits. CHEST/PULM:  Clear to auscultation and percussion breath sounds equal no wheeze , rales or rhonchi. No chest wall deformities or tenderness. Breast: normal by inspection . No dimpling, discharge, masses, tenderness or discharge . CV: PMI is nondisplaced, S1 S2 no gallops, murmurs, rubs. Peripheral pulses are full without delay.No JVD .  ABDOMEN: Bowel sounds normal nontender  No guard or rebound, no hepato splenomegal no CVA tenderness.   Extremtities:  No clubbing cyanosis or edema, no acute joint swelling or redness no focal atrophy NEURO:  Oriented x3, cranial nerves 3-12 appear to be intact, no obvious focal weakness,gait within normal limits no abnormal reflexes or asymmetrical SKIN: No acute rashes normal turgor, color, no bruising or petechiae. PSYCH: Oriented, good eye contact, no obvious depression anxiety, cognition and judgment appear normal. LN: no cervical axillary adenopathy  Lab Results  Component Value Date   WBC 5.9 09/24/2022   HGB 13.4 09/24/2022   HCT 40.8 09/24/2022   PLT 340.0 09/24/2022   GLUCOSE 92 04/10/2023   CHOL 122 09/24/2022   TRIG 90.0 09/24/2022   HDL 37.20 (L) 09/24/2022   LDLCALC 66 09/24/2022   ALT 25 09/24/2022   AST 25 09/24/2022   NA 136 04/10/2023   K 4.2 04/10/2023   CL 98 04/10/2023   CREATININE 0.86 04/10/2023   BUN 12 04/10/2023   CO2 30 04/10/2023   TSH 0.84 05/27/2023   HGBA1C 6.2 (A) 02/12/2023   MICROALBUR 1.3 04/10/2023    BP Readings from Last 3 Encounters:  01/02/24 122/80  12/16/23 110/72  08/28/23 (!) 140/80    Lab results reviewed with patient   ASSESSMENT AND  PLAN:  Discussed the following assessment and plan:    ICD-10-CM   1. Morbid obesity, unspecified obesity type (HCC)  E66.01     2. Type 2 diabetes mellitus with hyperglycemia, without long-term current use of insulin (HCC)  E11.65     3. Medication management  Z79.899     4. Essential hypertension  I10      No follow-ups on file.  Patient Care Team: Clothilde Tippetts, Apolinar POUR, MD as PCP - General Aneita Gwendlyn DASEN, MD (Inactive) as Attending Physician (Gastroenterology) Patel, Donika K, DO as Consulting Physician (Neurology) [facility] Juliet Glade CROME, MD (Obstetrics and Gynecology) There are no  Patient Instructions on file for this visit.  Dailan Pfalzgraf K. Bailey Faiella M.D.

## 2024-01-02 NOTE — Patient Instructions (Addendum)
 Stop tobacco  Optimize physical activity   muscle exercises   Lab results if ok continue .  May be some other options to rearrange  to help potassium level   Bp goal is 120 over 80 and below average    Considier hep b vaccine

## 2024-01-03 ENCOUNTER — Encounter: Payer: Self-pay | Admitting: Internal Medicine

## 2024-01-06 ENCOUNTER — Ambulatory Visit: Payer: Self-pay | Admitting: Internal Medicine

## 2024-01-06 DIAGNOSIS — D582 Other hemoglobinopathies: Secondary | ICD-10-CM

## 2024-01-06 DIAGNOSIS — R7989 Other specified abnormal findings of blood chemistry: Secondary | ICD-10-CM

## 2024-01-06 NOTE — Progress Notes (Signed)
 Kidney function potassium are normal , stay on potassium  Ldl is excellent at goal Thyroid  screen is off  needs repeat   hg is elevated ( could be from tobacco and hydration) Please order tsh and free t4  free t3 and cbcdiff in 2-3 months may sure not dehydrated .  Dont have to fast   dx  low tsh level and inc hg

## 2024-01-06 NOTE — Progress Notes (Signed)
 Neg stool screen

## 2024-01-07 NOTE — Telephone Encounter (Signed)
" ° °  See my comments on last labs results  and to repeat some of these labs From yeasterday message   I think this message has been handled   "

## 2024-02-01 ENCOUNTER — Other Ambulatory Visit: Payer: Self-pay | Admitting: Neurology

## 2024-02-14 ENCOUNTER — Other Ambulatory Visit: Payer: Self-pay | Admitting: Internal Medicine

## 2024-03-06 ENCOUNTER — Ambulatory Visit: Admitting: Allergy & Immunology

## 2024-03-09 ENCOUNTER — Other Ambulatory Visit

## 2024-05-07 ENCOUNTER — Ambulatory Visit: Admitting: Internal Medicine

## 2024-05-26 ENCOUNTER — Ambulatory Visit: Admitting: Neurology
